# Patient Record
Sex: Male | Born: 1950
Health system: Southern US, Community
[De-identification: ages and names within clinical notes are randomized; demographics above are authoritative.]

## PROBLEM LIST (undated history)

## (undated) DIAGNOSIS — I10 Essential (primary) hypertension: Secondary | ICD-10-CM

## (undated) DIAGNOSIS — I509 Heart failure, unspecified: Secondary | ICD-10-CM

## (undated) DIAGNOSIS — I248 Other forms of acute ischemic heart disease: Secondary | ICD-10-CM

## (undated) DIAGNOSIS — I429 Cardiomyopathy, unspecified: Secondary | ICD-10-CM

## (undated) DIAGNOSIS — I2489 Other forms of acute ischemic heart disease: Secondary | ICD-10-CM

## (undated) DIAGNOSIS — N4889 Other specified disorders of penis: Secondary | ICD-10-CM

## (undated) DIAGNOSIS — E0590 Thyrotoxicosis, unspecified without thyrotoxic crisis or storm: Secondary | ICD-10-CM

## (undated) DIAGNOSIS — R079 Chest pain, unspecified: Secondary | ICD-10-CM

## (undated) DIAGNOSIS — N179 Acute kidney failure, unspecified: Secondary | ICD-10-CM

## (undated) DIAGNOSIS — N1330 Unspecified hydronephrosis: Secondary | ICD-10-CM

## (undated) DIAGNOSIS — R7989 Other specified abnormal findings of blood chemistry: Secondary | ICD-10-CM

## (undated) DIAGNOSIS — E119 Type 2 diabetes mellitus without complications: Secondary | ICD-10-CM

## (undated) HISTORY — PX: NOSE SURGERY: SHX723

## (undated) HISTORY — DX: Unspecified hydronephrosis: N13.30

## (undated) HISTORY — DX: Thyrotoxicosis, unspecified without thyrotoxic crisis or storm: E05.90

## (undated) HISTORY — DX: Other specified disorders of penis: N48.89

## (undated) HISTORY — DX: Other forms of acute ischemic heart disease: I24.8

## (undated) HISTORY — DX: Heart failure, unspecified: I50.9

## (undated) HISTORY — DX: Chest pain, unspecified: R07.9

## (undated) HISTORY — DX: Other forms of acute ischemic heart disease: I24.89

## (undated) HISTORY — DX: Acute kidney failure, unspecified: N17.9

## (undated) HISTORY — DX: Cardiomyopathy, unspecified: I42.9

## (undated) HISTORY — DX: Other specified abnormal findings of blood chemistry: R79.89

---

## 1997-07-07 ENCOUNTER — Ambulatory Visit (HOSPITAL_BASED_OUTPATIENT_CLINIC_OR_DEPARTMENT_OTHER): Admission: RE | Admit: 1997-07-07 | Discharge: 1997-07-07 | Payer: Self-pay | Admitting: *Deleted

## 1998-08-25 ENCOUNTER — Emergency Department (HOSPITAL_COMMUNITY): Admission: EM | Admit: 1998-08-25 | Discharge: 1998-08-26 | Payer: Self-pay | Admitting: Emergency Medicine

## 1998-08-29 ENCOUNTER — Encounter: Admission: RE | Admit: 1998-08-29 | Discharge: 1998-08-29 | Payer: Self-pay | Admitting: *Deleted

## 2006-08-04 ENCOUNTER — Ambulatory Visit: Payer: Self-pay | Admitting: Family Medicine

## 2006-08-05 ENCOUNTER — Ambulatory Visit: Payer: Self-pay | Admitting: Family Medicine

## 2017-12-17 ENCOUNTER — Encounter (HOSPITAL_COMMUNITY): Payer: Self-pay | Admitting: Emergency Medicine

## 2017-12-17 ENCOUNTER — Inpatient Hospital Stay (HOSPITAL_COMMUNITY)
Admission: EM | Admit: 2017-12-17 | Discharge: 2017-12-24 | DRG: 291 | Disposition: A | Discharge status: Home / Self Care | Payer: Medicare Other | Attending: Internal Medicine | Admitting: Internal Medicine

## 2017-12-17 ENCOUNTER — Emergency Department (HOSPITAL_COMMUNITY): Payer: Medicare Other

## 2017-12-17 ENCOUNTER — Other Ambulatory Visit: Payer: Self-pay

## 2017-12-17 DIAGNOSIS — E0590 Thyrotoxicosis, unspecified without thyrotoxic crisis or storm: Secondary | ICD-10-CM | POA: Diagnosis present

## 2017-12-17 DIAGNOSIS — D1724 Benign lipomatous neoplasm of skin and subcutaneous tissue of left leg: Secondary | ICD-10-CM | POA: Diagnosis present

## 2017-12-17 DIAGNOSIS — N179 Acute kidney failure, unspecified: Secondary | ICD-10-CM | POA: Diagnosis present

## 2017-12-17 DIAGNOSIS — Y92230 Patient room in hospital as the place of occurrence of the external cause: Secondary | ICD-10-CM | POA: Diagnosis present

## 2017-12-17 DIAGNOSIS — Z9119 Patient's noncompliance with other medical treatment and regimen: Secondary | ICD-10-CM

## 2017-12-17 DIAGNOSIS — I161 Hypertensive emergency: Secondary | ICD-10-CM | POA: Diagnosis present

## 2017-12-17 DIAGNOSIS — R061 Stridor: Secondary | ICD-10-CM

## 2017-12-17 DIAGNOSIS — I429 Cardiomyopathy, unspecified: Secondary | ICD-10-CM | POA: Diagnosis present

## 2017-12-17 DIAGNOSIS — R946 Abnormal results of thyroid function studies: Secondary | ICD-10-CM | POA: Diagnosis present

## 2017-12-17 DIAGNOSIS — I080 Rheumatic disorders of both mitral and aortic valves: Secondary | ICD-10-CM | POA: Diagnosis present

## 2017-12-17 DIAGNOSIS — I509 Heart failure, unspecified: Secondary | ICD-10-CM

## 2017-12-17 DIAGNOSIS — N1330 Unspecified hydronephrosis: Secondary | ICD-10-CM | POA: Diagnosis not present

## 2017-12-17 DIAGNOSIS — I130 Hypertensive heart and chronic kidney disease with heart failure and stage 1 through stage 4 chronic kidney disease, or unspecified chronic kidney disease: Secondary | ICD-10-CM | POA: Diagnosis not present

## 2017-12-17 DIAGNOSIS — E1122 Type 2 diabetes mellitus with diabetic chronic kidney disease: Secondary | ICD-10-CM | POA: Diagnosis not present

## 2017-12-17 DIAGNOSIS — R079 Chest pain, unspecified: Secondary | ICD-10-CM | POA: Diagnosis not present

## 2017-12-17 DIAGNOSIS — N183 Chronic kidney disease, stage 3 (moderate): Secondary | ICD-10-CM | POA: Diagnosis present

## 2017-12-17 DIAGNOSIS — N5082 Scrotal pain: Secondary | ICD-10-CM

## 2017-12-17 DIAGNOSIS — I5043 Acute on chronic combined systolic (congestive) and diastolic (congestive) heart failure: Secondary | ICD-10-CM | POA: Diagnosis present

## 2017-12-17 DIAGNOSIS — T508X5A Adverse effect of diagnostic agents, initial encounter: Secondary | ICD-10-CM | POA: Diagnosis present

## 2017-12-17 DIAGNOSIS — I4581 Long QT syndrome: Secondary | ICD-10-CM | POA: Diagnosis present

## 2017-12-17 DIAGNOSIS — R0602 Shortness of breath: Secondary | ICD-10-CM | POA: Diagnosis not present

## 2017-12-17 DIAGNOSIS — I110 Hypertensive heart disease with heart failure: Secondary | ICD-10-CM | POA: Diagnosis not present

## 2017-12-17 DIAGNOSIS — R7989 Other specified abnormal findings of blood chemistry: Secondary | ICD-10-CM

## 2017-12-17 DIAGNOSIS — N1339 Other hydronephrosis: Secondary | ICD-10-CM | POA: Diagnosis not present

## 2017-12-17 DIAGNOSIS — I160 Hypertensive urgency: Secondary | ICD-10-CM | POA: Diagnosis not present

## 2017-12-17 DIAGNOSIS — R945 Abnormal results of liver function studies: Secondary | ICD-10-CM | POA: Diagnosis present

## 2017-12-17 DIAGNOSIS — R3129 Other microscopic hematuria: Secondary | ICD-10-CM | POA: Diagnosis present

## 2017-12-17 DIAGNOSIS — R609 Edema, unspecified: Secondary | ICD-10-CM

## 2017-12-17 DIAGNOSIS — N4889 Other specified disorders of penis: Secondary | ICD-10-CM | POA: Diagnosis present

## 2017-12-17 DIAGNOSIS — R778 Other specified abnormalities of plasma proteins: Secondary | ICD-10-CM | POA: Diagnosis present

## 2017-12-17 DIAGNOSIS — I248 Other forms of acute ischemic heart disease: Secondary | ICD-10-CM

## 2017-12-17 DIAGNOSIS — R0603 Acute respiratory distress: Secondary | ICD-10-CM

## 2017-12-17 HISTORY — DX: Type 2 diabetes mellitus without complications: E11.9

## 2017-12-17 HISTORY — DX: Heart failure, unspecified: I50.9

## 2017-12-17 HISTORY — DX: Acute kidney failure, unspecified: N17.9

## 2017-12-17 HISTORY — DX: Essential (primary) hypertension: I10

## 2017-12-17 HISTORY — DX: Chest pain, unspecified: R07.9

## 2017-12-17 LAB — POCT I-STAT TROPONIN I: TROPONIN I, POC: 0.08 ng/mL (ref 0.00–0.08)

## 2017-12-17 LAB — URINALYSIS, ROUTINE W REFLEX MICROSCOPIC
BILIRUBIN URINE: NEGATIVE
Bacteria, UA: NONE SEEN
GLUCOSE, UA: NEGATIVE mg/dL
KETONES UR: 5 mg/dL — AB
Leukocytes, UA: NEGATIVE
NITRITE: NEGATIVE
PH: 8 (ref 5.0–8.0)
Protein, ur: NEGATIVE mg/dL
Specific Gravity, Urine: 1.015 (ref 1.005–1.030)

## 2017-12-17 LAB — CBC
HEMATOCRIT: 42.8 % (ref 39.0–52.0)
Hemoglobin: 14 g/dL (ref 13.0–17.0)
MCH: 27.3 pg (ref 26.0–34.0)
MCHC: 32.7 g/dL (ref 30.0–36.0)
MCV: 83.6 fL (ref 78.0–100.0)
Platelets: 215 10*3/uL (ref 150–400)
RBC: 5.12 MIL/uL (ref 4.22–5.81)
RDW: 14 % (ref 11.5–15.5)
WBC: 7.9 10*3/uL (ref 4.0–10.5)

## 2017-12-17 LAB — BASIC METABOLIC PANEL
Anion gap: 7 (ref 5–15)
BUN: 24 mg/dL — AB (ref 8–23)
CO2: 29 mmol/L (ref 22–32)
Calcium: 8.7 mg/dL — ABNORMAL LOW (ref 8.9–10.3)
Chloride: 105 mmol/L (ref 98–111)
Creatinine, Ser: 1.71 mg/dL — ABNORMAL HIGH (ref 0.61–1.24)
GFR calc Af Amer: 47 mL/min — ABNORMAL LOW (ref >60)
GFR calc non Af Amer: 40 mL/min — ABNORMAL LOW (ref >60)
Glucose, Bld: 150 mg/dL — ABNORMAL HIGH (ref 70–99)
Potassium: 3.7 mmol/L (ref 3.5–5.1)
SODIUM: 141 mmol/L (ref 135–145)

## 2017-12-17 LAB — I-STAT TROPONIN, ED: Troponin i, poc: 0.07 ng/mL (ref 0.00–0.08)

## 2017-12-17 LAB — BRAIN NATRIURETIC PEPTIDE: B NATRIURETIC PEPTIDE 5: 1450.7 pg/mL — AB (ref 0.0–100.0)

## 2017-12-17 MED ORDER — ACETAMINOPHEN 650 MG RE SUPP: 650.0000 mg | Freq: Four times a day (QID) | RECTAL | Status: DC | PRN

## 2017-12-17 MED ORDER — LABETALOL HCL 100 MG PO TABS
100.0000 mg | ORAL_TABLET | Freq: Two times a day (BID) | ORAL | Status: DC
  Administered 2017-12-17 – 2017-12-18 (×2): 100 mg via ORAL
  Filled 2017-12-17 (×2): qty 1

## 2017-12-17 MED ORDER — ONDANSETRON HCL 4 MG/2ML IJ SOLN: 4.0000 mg | Freq: Four times a day (QID) | INTRAMUSCULAR | Status: DC | PRN

## 2017-12-17 MED ORDER — NICARDIPINE HCL IN NACL 20-0.86 MG/200ML-% IV SOLN
3.0000 mg/h | Freq: Once | INTRAVENOUS | Status: AC
  Administered 2017-12-17: 5 mg/h via INTRAVENOUS
  Filled 2017-12-17: qty 200

## 2017-12-17 MED ORDER — ACETAMINOPHEN 325 MG PO TABS
650.0000 mg | ORAL_TABLET | Freq: Four times a day (QID) | ORAL | Status: DC | PRN
  Administered 2017-12-21 – 2017-12-23 (×3): 650 mg via ORAL
  Filled 2017-12-17 (×3): qty 2

## 2017-12-17 MED ORDER — LABETALOL HCL 5 MG/ML IV SOLN: 10.0000 mg | INTRAVENOUS | Status: DC | PRN

## 2017-12-17 MED ORDER — LABETALOL HCL 5 MG/ML IV SOLN: 5.0000 mg | INTRAVENOUS | Status: DC | PRN

## 2017-12-17 MED ORDER — ONDANSETRON HCL 4 MG PO TABS: 4.0000 mg | ORAL_TABLET | Freq: Four times a day (QID) | ORAL | Status: DC | PRN

## 2017-12-17 MED ORDER — IOPAMIDOL (ISOVUE-370) INJECTION 76%
80.0000 mL | Freq: Once | INTRAVENOUS | Status: AC | PRN
  Administered 2017-12-17: 80 mL via INTRAVENOUS

## 2017-12-17 NOTE — H&P (Signed)
History and Physical    Daniel Obrien ZDG:[id number] DOB: Jan 05, 1951 DOA: 12/17/2017  PCP: Patient, No Pcp Per  Patient coming from: Home.  Chief Complaint: Chest pain.  History obtained through interpretation provided by patient's son.  Patient speaks [place].  HPI: Daniel Obrien is a 67 y.o. male with history of hypertension has not been taking her medications for last many years presents to the ER with complaint of chest pain which has been ongoing for last 24 hours.  Patient tried over-the-counter ibuprofen despite which pain did not improve.  Patient's chest pain is across the anterior chest wall stabbing in nature nonradiating no associated shortness of breath productive cough fever or chills.  Denies any headache visual symptoms or any focal deficits.  ED Course: In the ER patient blood pressure is found to be more than 951 systolic and was started on Cardene infusion initially.  Soon blood pressure improved to 884 systolic and by the time I examined patient blood pressure has decreased to 127 and Cardene was discontinued.  EKG shows normal sinus rhythm troponin was negative BNP was thousand 400.  Since patient had significantly elevated blood pressure with chest pain CT angiogram of the chest abdomen and pelvis was done to rule out dissection.  It was negative for dissection.  He did show cardiomegaly and congestion.  Labs revealed creatinine 1.7.  Review of Systems: As per HPI, rest all negative.   Past Medical History:  Diagnosis Date  . Hypertension     History reviewed. No pertinent surgical history.   reports that he has never smoked. He has never used smokeless tobacco. He reports that he does not drink alcohol. His drug history is not on file.  No Known Allergies  Family History  Problem Relation Age of Onset  . Hypertension Neg Hx     Prior to Admission medications   Medication Sig Start Date End Date Taking? Authorizing Provider  ibuprofen (ADVIL,MOTRIN) 200 MG  tablet Take 200 mg by mouth every 6 (six) hours as needed for moderate pain.   Yes [provider]    Physical Exam: Vitals:   12/17/17 2100 12/17/17 2130 12/17/17 2145 12/17/17 2200  BP: (!) 162/93 127/85 (!) 166/95 (!) 160/98  Pulse: 91 90 94 89  Resp:      Temp:      TempSrc:      SpO2: 95% 97% 95% 96%      Constitutional: Moderately built and nourished. Vitals:   12/17/17 2100 12/17/17 2130 12/17/17 2145 12/17/17 2200  BP: (!) 162/93 127/85 (!) 166/95 (!) 160/98  Pulse: 91 90 94 89  Resp:      Temp:      TempSrc:      SpO2: 95% 97% 95% 96%   Eyes: Anicteric no pallor. ENMT: No discharge from the ears eyes nose or mouth. Neck: No mass or.  No neck rigidity.  No JVD appreciated. Respiratory: No rhonchi or crepitations. Cardiovascular: S1-S2 heard no murmurs appreciated. Abdomen: Soft nontender bowel sounds present. Musculoskeletal: No edema. Skin: No rash. Neurologic: Alert awake oriented to time place and person.  Moves all extremities. Psychiatric: Appears normal.  Normal affect.   Labs on Admission: I have personally reviewed following labs and imaging studies  CBC: Recent Labs  Lab 12/17/17 1610  WBC 7.9  HGB 14.0  HCT 42.8  MCV 83.6  PLT 166   Basic Metabolic Panel: Recent Labs  Lab 12/17/17 1610  NA 141  K 3.7  CL 105  CO2 29  GLUCOSE 150*  BUN 24*  CREATININE 1.71*  CALCIUM 8.7*   GFR: CrCl cannot be calculated (Unknown ideal weight.). Liver Function Tests: No results for input(s): AST, ALT, ALKPHOS, BILITOT, PROT, ALBUMIN in the last 168 hours. No results for input(s): LIPASE, AMYLASE in the last 168 hours. No results for input(s): AMMONIA in the last 168 hours. Coagulation Profile: No results for input(s): INR, PROTIME in the last 168 hours. Cardiac Enzymes: No results for input(s): CKTOTAL, CKMB, CKMBINDEX, TROPONINI in the last 168 hours. BNP (last 3 results) No results for input(s): PROBNP in the last 8760  hours. HbA1C: No results for input(s): HGBA1C in the last 72 hours. CBG: No results for input(s): GLUCAP in the last 168 hours. Lipid Profile: No results for input(s): CHOL, HDL, LDLCALC, TRIG, CHOLHDL, LDLDIRECT in the last 72 hours. Thyroid Function Tests: No results for input(s): TSH, T4TOTAL, FREET4, T3FREE, THYROIDAB in the last 72 hours. Anemia Panel: No results for input(s): VITAMINB12, FOLATE, FERRITIN, TIBC, IRON, RETICCTPCT in the last 72 hours. Urine analysis: No results found for: COLORURINE, APPEARANCEUR, LABSPEC, PHURINE, GLUCOSEU, HGBUR, BILIRUBINUR, KETONESUR, PROTEINUR, UROBILINOGEN, NITRITE, LEUKOCYTESUR Sepsis Labs: @LABRCNTIP (procalcitonin:4,lacticidven:4) )No results found for this or any previous visit (from the past 240 hour(s)).   Radiological Exams on Admission: Dg Chest 2 View  Result Date: 12/17/2017 CLINICAL DATA:  One day history of chest pain, shortness of breath and generalized body aches. EXAM: CHEST - 2 VIEW COMPARISON:  None. FINDINGS: AP SEMI-ERECT and LATERAL images were obtained. Cardiac silhouette markedly enlarged even allowing for AP technique. Thoracic aorta tortuous and mildly atherosclerotic. Hilar and mediastinal contours otherwise unremarkable. Mild diffuse interstitial pulmonary edema as there are scattered Kerley B lines scattered throughout both lungs. No confluent airspace consolidation. No significant pleural effusions. Visualized bony thorax intact. IMPRESSION: Mild CHF, with marked cardiomegaly and mild diffuse interstitial pulmonary edema. Electronically Signed   By: Evangeline Dakin M.D.   On: 12/17/2017 17:23   Ct Angio Chest/abd/pel For Dissection W And/or W/wo  Result Date: 12/17/2017 CLINICAL DATA:  Hypertension, shortness of breath, chest pain EXAM: CT ANGIOGRAPHY CHEST, ABDOMEN AND PELVIS TECHNIQUE: Multidetector CT imaging through the chest, abdomen and pelvis was performed using the standard protocol during bolus administration of  intravenous contrast. Multiplanar reconstructed images and MIPs were obtained and reviewed to evaluate the vascular anatomy. CONTRAST:  12mL ISOVUE-370 IOPAMIDOL (ISOVUE-370) INJECTION 76% COMPARISON:  12/17/2017 FINDINGS: CTA CHEST FINDINGS Cardiovascular: Atherosclerosis of the thoracic aorta. Negative for significant dissection, aneurysm, or hyperdense intramural hematoma. Patent tortuous three-vessel arch anatomy. No mediastinal hemorrhage or hematoma. Central pulmonary arteries appear patent. Is mildly enlarged. No pericardial effusion. Mediastinum/Nodes: Thyroid mildly heterogeneous. Trachea and central airways are patent. Esophagus is nondilated. No significant hernia. No adenopathy. Lungs/Pleura: Upper lobe interlobular septal thickening. Diffuse respiratory motion artifact. Mild scattered ground-glass opacity throughout both lungs. Low lung volumes with basilar vascular crowding. Appearance suggest early interstitial edema. No definite focal acute airspace process, collapse or consolidation. No interstitial lung disease. No pleural abnormality, effusion, or pneumothorax. Musculoskeletal: No acute osseous finding. No compression fracture. Sternum intact. Review of the MIP images confirms the above findings. CTA ABDOMEN AND PELVIS FINDINGS VASCULAR Aorta: Abdominal atherosclerosis and mild tortuosity without aneurysm, dissection, occlusive process or acute vascular finding. No retroperitoneal hemorrhage or hematoma. Celiac: Widely patent including its branches SMA: Widely patent including its branches Renals: Both main renal arteries are patent. IMA: Remains patent off the distal aorta including its branches Inflow: Atherosclerosis and tortuosity of the iliac  vessels. Iliac vessels remain patent without inflow disease. The common, internal and external iliac arteries are all patent. Veins: Venous phase imaging not performed. Review of the MIP images confirms the above findings. NON-VASCULAR Hepatobiliary: No  focal liver abnormality is seen. No gallstones, gallbladder wall thickening, or biliary dilatation. Pancreas: Unremarkable. No pancreatic ductal dilatation or surrounding inflammatory changes. Spleen: Normal in size without focal abnormality. Adrenals/Urinary Tract: Normal adrenal glands. Right kidney and ureter demonstrate no acute process, obstructive uropathy, hydroureter, or ureteral calculus. Bladder unremarkable. Left kidney demonstrates chronic appearing mild to moderate hydronephrosis and proximal hydroureter without obstructing radiopaque left ureteral calculus. Stomach/Bowel: Negative for bowel obstruction, significant dilatation, ileus, or free air. Appendix not visualized. No acute inflammatory process, fluid collection, or abscess. Lymphatic: No adenopathy Reproductive: Unremarkable Other: No abdominal wall hernia or abnormality. No abdominopelvic ascites. Musculoskeletal: Degenerative changes of the spine. No acute osseous finding. Review of the MIP images confirms the above findings. IMPRESSION: Thoraco abdominal aortic atherosclerosis without acute dissection, significant aneurysm, occlusive disease or other acute vascular process. Cardiomegaly with mild pulmonary interstitial edema pattern suggesting early CHF or volume overload. Mild-to-moderate left hydroureteronephrosis without obstructing left ureteral calculus appearing more chronic than acute without surrounding perinephric edema. Consider nonemergent urology follow-up No other acute intra-abdominal or pelvic finding. Electronically Signed   By: Jerilynn Mages.  Shick M.D.   On: 12/17/2017 19:30    EKG: Independently reviewed.  Normal sinus rhythm.  Assessment/Plan Principal Problem:   Hypertensive urgency Active Problems:   Acute CHF (congestive heart failure) (HCC)   Chest pain   ARF (acute renal failure) ([place])    1. Hypertensive urgency -patient has history of hypertension has not been taking his medications not sure if he was prescribed  one before.  Initial blood pressure was markedly elevated and was placed on Cardene drip and has been weaned off now.  I have ordered labetalol 100 mg p.o. twice daily with PRN IV labetalol.  Closely follow blood pressure trends. 2. Acute CHF likely precipitated by hypertensive urgency.  Will check 2D echo.  Patient does have some congestion in the CAT scan but on exam there is no obvious fluid overload.  We will closely observe. 3. Chest pain -improved with control of blood pressure.  Presently chest pain-free.  We will cycle cardiac markers check 2D echo aspirin. 4. Renal failure no old labs to compare.  Likely could be acute from use of NSAIDs. 5. Hematuria and left hydronephrosis will need outpatient follow-up with urologist.  Abdomen appears benign on exam.   DVT prophylaxis: SCDs for now we will keep patient on Lovenox once blood pressure improves. Code Status: Full code. Family Communication: Patient's son. Disposition Plan: Home. Consults called: None.   Admission status: Observation.   Rise Patience MD [facility] Pager [phone].  If 7PM-7AM, please contact night-coverage [email or web address] Password [facility]  12/17/2017, 10:32 PM

## 2017-12-17 NOTE — ED Provider Notes (Signed)
[place] DEPT Provider Note   CSN: [id number] Arrival date & time: 12/17/17  1537     History   Chief Complaint Chief Complaint  Patient presents with  . Chest Pain  . Generalized Body Aches    HPI Daniel Obrien is a 67 y.o. male with a past medical history of hypertension who presents today for evaluation of chest pains and body pain since last night.  History is obtained by son, and video interpreter as patient is primarily [place] speaking.  Patient indicates that he wishes for his son to translate.  Patient reports chest pain and shortness of breath since last night.  Believes he has a history of hypertension, however does not take any medicine.  Patient's son reports that he does not go to the doctor or hospital regularly.  Patient's pain is left anterior chest, denies abdominal pain, back pain, nausea vomiting or diarrhea.  HPI  Past Medical History:  Diagnosis Date  . Hypertension     There are no active problems to display for this patient.   History reviewed. No pertinent surgical history.      Home Medications    Prior to Admission medications   Medication Sig Start Date End Date Taking? Authorizing Provider  ibuprofen (ADVIL,MOTRIN) 200 MG tablet Take 200 mg by mouth every 6 (six) hours as needed for moderate pain.   Yes [provider]    Family History No family history on file.  Social History Social History   Tobacco Use  . Smoking status: Never Smoker  . Smokeless tobacco: Never Used  Substance Use Topics  . Alcohol use: Not on file  . Drug use: Not on file     Allergies   Patient has no known allergies.   Review of Systems Review of Systems  Constitutional: Negative for chills and fever.  Respiratory: Positive for chest tightness and shortness of breath. Negative for cough.   Cardiovascular: Positive for chest pain. Negative for palpitations and leg swelling.  Gastrointestinal: Negative for  abdominal pain, diarrhea, nausea and vomiting.  Genitourinary: Positive for frequency and urgency. Negative for decreased urine volume.  All other systems reviewed and are negative.    Physical Exam Updated Vital Signs BP (!) 221/130   Pulse 86   Resp 16   SpO2 96%   Physical Exam  Constitutional: He appears well-developed and well-nourished.  Non-toxic appearance. No distress.  HENT:  Head: Normocephalic and atraumatic.  Eyes: Conjunctivae are normal. Right eye exhibits no discharge. Left eye exhibits no discharge. No scleral icterus.  Neck: Normal range of motion.  Cardiovascular: Normal rate, regular rhythm, intact distal pulses and normal pulses.  No murmur heard. Pulses:      Radial pulses are 2+ on the right side, and 2+ on the left side.       Dorsalis pedis pulses are 2+ on the right side, and 2+ on the left side.       Posterior tibial pulses are 2+ on the right side, and 2+ on the left side.  Pulmonary/Chest: Effort normal. Tachypnea noted. No respiratory distress. He has rales in the right middle field, the right lower field, the left middle field and the left lower field.  Abdominal: He exhibits no distension and no mass. There is no tenderness. There is no guarding.  Musculoskeletal: He exhibits no edema or deformity.       Right lower leg: Normal. He exhibits no tenderness and no edema.  Left lower leg: Normal. He exhibits no tenderness and no edema.  Neurological: He is alert. He exhibits normal muscle tone.  Skin: Skin is warm and dry. He is not diaphoretic.  Psychiatric: He has a normal mood and affect. His behavior is normal.  Nursing note and vitals reviewed.    ED Treatments / Results  Labs (all labs ordered are listed, but only abnormal results are displayed) Labs Reviewed  BASIC METABOLIC PANEL - Abnormal; Notable for the following components:      Result Value   Glucose, Bld 150 (*)    BUN 24 (*)    Creatinine, Ser 1.71 (*)    Calcium 8.7 (*)     GFR calc non Af Amer 40 (*)    GFR calc Af Amer 47 (*)    All other components within normal limits  BRAIN NATRIURETIC PEPTIDE - Abnormal; Notable for the following components:   B Natriuretic Peptide 1,450.7 (*)    All other components within normal limits  URINE CULTURE  CBC  URINALYSIS, ROUTINE W REFLEX MICROSCOPIC  I-STAT TROPONIN, ED  I-STAT TROPONIN, ED  POCT I-STAT TROPONIN I    EKG EKG Interpretation  Date/Time:  Wednesday December 17 2017 15:48:32 EDT Ventricular Rate:  87 PR Interval:    QRS Duration: 115 QT Interval:  395 QTC Calculation: 476 R Axis:   -32 Text Interpretation:  Sinus rhythm LVH with secondary repolarization abnormality Borderline prolonged QT interval no prior to compare  Confirmed by Aletta Edouard [phone]) on 12/17/2017 5:12:33 PM   Radiology Dg Chest 2 View  Result Date: 12/17/2017 CLINICAL DATA:  One day history of chest pain, shortness of breath and generalized body aches. EXAM: CHEST - 2 VIEW COMPARISON:  None. FINDINGS: AP SEMI-ERECT and LATERAL images were obtained. Cardiac silhouette markedly enlarged even allowing for AP technique. Thoracic aorta tortuous and mildly atherosclerotic. Hilar and mediastinal contours otherwise unremarkable. Mild diffuse interstitial pulmonary edema as there are scattered Kerley B lines scattered throughout both lungs. No confluent airspace consolidation. No significant pleural effusions. Visualized bony thorax intact. IMPRESSION: Mild CHF, with marked cardiomegaly and mild diffuse interstitial pulmonary edema. Electronically Signed   By: Evangeline Dakin M.D.   On: 12/17/2017 17:23   Ct Angio Chest/abd/pel For Dissection W And/or W/wo  Result Date: 12/17/2017 CLINICAL DATA:  Hypertension, shortness of breath, chest pain EXAM: CT ANGIOGRAPHY CHEST, ABDOMEN AND PELVIS TECHNIQUE: Multidetector CT imaging through the chest, abdomen and pelvis was performed using the standard protocol during bolus administration of  intravenous contrast. Multiplanar reconstructed images and MIPs were obtained and reviewed to evaluate the vascular anatomy. CONTRAST:  28mL ISOVUE-370 IOPAMIDOL (ISOVUE-370) INJECTION 76% COMPARISON:  12/17/2017 FINDINGS: CTA CHEST FINDINGS Cardiovascular: Atherosclerosis of the thoracic aorta. Negative for significant dissection, aneurysm, or hyperdense intramural hematoma. Patent tortuous three-vessel arch anatomy. No mediastinal hemorrhage or hematoma. Central pulmonary arteries appear patent. Is mildly enlarged. No pericardial effusion. Mediastinum/Nodes: Thyroid mildly heterogeneous. Trachea and central airways are patent. Esophagus is nondilated. No significant hernia. No adenopathy. Lungs/Pleura: Upper lobe interlobular septal thickening. Diffuse respiratory motion artifact. Mild scattered ground-glass opacity throughout both lungs. Low lung volumes with basilar vascular crowding. Appearance suggest early interstitial edema. No definite focal acute airspace process, collapse or consolidation. No interstitial lung disease. No pleural abnormality, effusion, or pneumothorax. Musculoskeletal: No acute osseous finding. No compression fracture. Sternum intact. Review of the MIP images confirms the above findings. CTA ABDOMEN AND PELVIS FINDINGS VASCULAR Aorta: Abdominal atherosclerosis and mild tortuosity without aneurysm, dissection, occlusive  process or acute vascular finding. No retroperitoneal hemorrhage or hematoma. Celiac: Widely patent including its branches SMA: Widely patent including its branches Renals: Both main renal arteries are patent. IMA: Remains patent off the distal aorta including its branches Inflow: Atherosclerosis and tortuosity of the iliac vessels. Iliac vessels remain patent without inflow disease. The common, internal and external iliac arteries are all patent. Veins: Venous phase imaging not performed. Review of the MIP images confirms the above findings. NON-VASCULAR Hepatobiliary: No  focal liver abnormality is seen. No gallstones, gallbladder wall thickening, or biliary dilatation. Pancreas: Unremarkable. No pancreatic ductal dilatation or surrounding inflammatory changes. Spleen: Normal in size without focal abnormality. Adrenals/Urinary Tract: Normal adrenal glands. Right kidney and ureter demonstrate no acute process, obstructive uropathy, hydroureter, or ureteral calculus. Bladder unremarkable. Left kidney demonstrates chronic appearing mild to moderate hydronephrosis and proximal hydroureter without obstructing radiopaque left ureteral calculus. Stomach/Bowel: Negative for bowel obstruction, significant dilatation, ileus, or free air. Appendix not visualized. No acute inflammatory process, fluid collection, or abscess. Lymphatic: No adenopathy Reproductive: Unremarkable Other: No abdominal wall hernia or abnormality. No abdominopelvic ascites. Musculoskeletal: Degenerative changes of the spine. No acute osseous finding. Review of the MIP images confirms the above findings. IMPRESSION: Thoraco abdominal aortic atherosclerosis without acute dissection, significant aneurysm, occlusive disease or other acute vascular process. Cardiomegaly with mild pulmonary interstitial edema pattern suggesting early CHF or volume overload. Mild-to-moderate left hydroureteronephrosis without obstructing left ureteral calculus appearing more chronic than acute without surrounding perinephric edema. Consider nonemergent urology follow-up No other acute intra-abdominal or pelvic finding. Electronically Signed   By: Jerilynn Mages.  Shick M.D.   On: 12/17/2017 19:30    Procedures Procedures (including critical care time)  CRITICAL CARE Performed by: Wyn Quaker Total critical care time: 40 minutes Critical care time was exclusive of separately billable procedures and treating other patients. Critical care was necessary to treat or prevent imminent or life-threatening deterioration. Critical care was time spent  personally by me on the following activities: development of treatment plan with patient and/or surrogate as well as nursing, discussions with consultants, evaluation of patient's response to treatment, examination of patient, obtaining history from patient or surrogate, ordering and performing treatments and interventions, ordering and review of laboratory studies, ordering and review of radiographic studies, pulse oximetry and re-evaluation of patient's condition. Hypertensive emergency with acute CHF, requiring cardene drip.     Medications Ordered in ED Medications  nicardipine (CARDENE) 20mg  in 0.86% saline 23ml IV infusion (0.1 mg/ml) (0 mg/hr Intravenous Stopped 12/17/17 2129)  iopamidol (ISOVUE-370) 76 % injection 80 mL (80 mLs Intravenous Contrast Given 12/17/17 1834)     Initial Impression / Assessment and Plan / ED Course  I have reviewed the triage vital signs and the nursing notes.  Pertinent labs & imaging results that were available during my care of the patient were reviewed by me and considered in my medical decision making (see chart for details).  Clinical Course as of Dec 17 1745  Wed Dec 17, 2017  1743 Asked CT scan to scan patient next.  Ordered Cardene drip.  Told Dr. Melina Copa about patient status.    [EH]  [id number] 66 year old male [place] speaking history probably from son.  He is here with chest pain and body pain.  He is very hypertensive here.  Starting him on IV drip for his hypertension and getting lab work and CT.   [MB]    Clinical Course User Index [EH] Lorin Glass, PA-C [MB] Hayden Rasmussen, MD  Kymere Fullington presents today for evaluation of left-sided chest pain.  He reportedly has a history of hypertension, however is noncompliant with his medications and has not been to a doctor in many years according to his son.  He was noted to be markedly hypertensive with blood pressure of 221/130 and mildly tachypneic.  Chest x-ray showed concern for mild  edema with curly B-lines and marked cardiomegaly.  It was noted on chest x-ray that his thoracic aorta is tortuous.  Given his acute shortness of breath and pulmonary edema with chest pain concern for hypertensive emergency.  Patient does not have previous labs on file to compare.  Given chest pain with widened aorta in the setting of many years of untreated hypertension will obtain CT dissection study.  His GFR is mildly low, however, considering that an untreated thoracic dissection would most likely be fatal will pursue CT imaging.  Patient was started on Cardene drip.    CT Disection study showed concern for pulmonary edema.  His BNP is elevated at 1450.  Troponin x2 is on the upper limit of normal at 0.07 and 0.08.  Sugar is mildly elevated at 150.  Do not have baseline labs in the system to compare to.  Given that patient got contrast for CT dissection study with either AKI or CKD, concern for lasix causing increased kidney stress.  His breathing and chest pain got better after cardene drip.    Hospitalist Dr. Hal Hope who agreed to admit patient.    This patient was seen as a shared visit with Dr. Melina Copa.     Final Clinical Impressions(s) / ED Diagnoses   Final diagnoses:  Hypertensive emergency  Congestive heart failure, unspecified HF chronicity, unspecified heart failure type [facility])    ED Discharge Orders    None       Ollen Gross 12/17/17 2232    Hayden Rasmussen, MD 12/18/17 [phone]

## 2017-12-17 NOTE — ED Triage Notes (Signed)
Pt's son states that pt c/o chest pain and body pains since yesterday. Denies n/v/d or cough. Believes pt has HTN.

## 2017-12-17 NOTE — ED Notes (Signed)
Cardene drip stopped currently. Hospitalist aware and will put new orders in for patient. Hospitalist aware BP of 127/85.

## 2017-12-18 ENCOUNTER — Inpatient Hospital Stay (HOSPITAL_COMMUNITY): Payer: Medicare Other

## 2017-12-18 ENCOUNTER — Encounter (HOSPITAL_COMMUNITY): Payer: Self-pay | Admitting: Physician Assistant

## 2017-12-18 ENCOUNTER — Observation Stay (HOSPITAL_COMMUNITY): Payer: Medicare Other

## 2017-12-18 DIAGNOSIS — Y92230 Patient room in hospital as the place of occurrence of the external cause: Secondary | ICD-10-CM | POA: Diagnosis not present

## 2017-12-18 DIAGNOSIS — R311 Benign essential microscopic hematuria: Secondary | ICD-10-CM | POA: Diagnosis not present

## 2017-12-18 DIAGNOSIS — E0590 Thyrotoxicosis, unspecified without thyrotoxic crisis or storm: Secondary | ICD-10-CM | POA: Diagnosis not present

## 2017-12-18 DIAGNOSIS — I4581 Long QT syndrome: Secondary | ICD-10-CM | POA: Diagnosis present

## 2017-12-18 DIAGNOSIS — I5041 Acute combined systolic (congestive) and diastolic (congestive) heart failure: Secondary | ICD-10-CM | POA: Diagnosis not present

## 2017-12-18 DIAGNOSIS — N179 Acute kidney failure, unspecified: Secondary | ICD-10-CM | POA: Diagnosis present

## 2017-12-18 DIAGNOSIS — I429 Cardiomyopathy, unspecified: Secondary | ICD-10-CM

## 2017-12-18 DIAGNOSIS — I080 Rheumatic disorders of both mitral and aortic valves: Secondary | ICD-10-CM | POA: Diagnosis present

## 2017-12-18 DIAGNOSIS — I420 Dilated cardiomyopathy: Secondary | ICD-10-CM

## 2017-12-18 DIAGNOSIS — R7989 Other specified abnormal findings of blood chemistry: Secondary | ICD-10-CM | POA: Diagnosis present

## 2017-12-18 DIAGNOSIS — R778 Other specified abnormalities of plasma proteins: Secondary | ICD-10-CM | POA: Diagnosis present

## 2017-12-18 DIAGNOSIS — D1724 Benign lipomatous neoplasm of skin and subcutaneous tissue of left leg: Secondary | ICD-10-CM | POA: Diagnosis present

## 2017-12-18 DIAGNOSIS — I5043 Acute on chronic combined systolic (congestive) and diastolic (congestive) heart failure: Secondary | ICD-10-CM | POA: Diagnosis present

## 2017-12-18 DIAGNOSIS — N189 Chronic kidney disease, unspecified: Secondary | ICD-10-CM | POA: Diagnosis not present

## 2017-12-18 DIAGNOSIS — N170 Acute kidney failure with tubular necrosis: Secondary | ICD-10-CM | POA: Diagnosis not present

## 2017-12-18 DIAGNOSIS — R0603 Acute respiratory distress: Secondary | ICD-10-CM | POA: Diagnosis not present

## 2017-12-18 DIAGNOSIS — N4889 Other specified disorders of penis: Secondary | ICD-10-CM | POA: Diagnosis not present

## 2017-12-18 DIAGNOSIS — E1122 Type 2 diabetes mellitus with diabetic chronic kidney disease: Secondary | ICD-10-CM | POA: Diagnosis present

## 2017-12-18 DIAGNOSIS — I161 Hypertensive emergency: Secondary | ICD-10-CM

## 2017-12-18 DIAGNOSIS — T508X5A Adverse effect of diagnostic agents, initial encounter: Secondary | ICD-10-CM | POA: Diagnosis present

## 2017-12-18 DIAGNOSIS — N184 Chronic kidney disease, stage 4 (severe): Secondary | ICD-10-CM | POA: Diagnosis not present

## 2017-12-18 DIAGNOSIS — I5021 Acute systolic (congestive) heart failure: Secondary | ICD-10-CM | POA: Diagnosis not present

## 2017-12-18 DIAGNOSIS — R079 Chest pain, unspecified: Secondary | ICD-10-CM | POA: Diagnosis not present

## 2017-12-18 DIAGNOSIS — I351 Nonrheumatic aortic (valve) insufficiency: Secondary | ICD-10-CM

## 2017-12-18 DIAGNOSIS — I160 Hypertensive urgency: Secondary | ICD-10-CM | POA: Diagnosis present

## 2017-12-18 DIAGNOSIS — N1330 Unspecified hydronephrosis: Secondary | ICD-10-CM | POA: Diagnosis present

## 2017-12-18 DIAGNOSIS — Z9119 Patient's noncompliance with other medical treatment and regimen: Secondary | ICD-10-CM | POA: Diagnosis not present

## 2017-12-18 DIAGNOSIS — N132 Hydronephrosis with renal and ureteral calculous obstruction: Secondary | ICD-10-CM | POA: Diagnosis not present

## 2017-12-18 DIAGNOSIS — I248 Other forms of acute ischemic heart disease: Secondary | ICD-10-CM

## 2017-12-18 DIAGNOSIS — R946 Abnormal results of thyroid function studies: Secondary | ICD-10-CM | POA: Diagnosis present

## 2017-12-18 DIAGNOSIS — N183 Chronic kidney disease, stage 3 (moderate): Secondary | ICD-10-CM | POA: Diagnosis present

## 2017-12-18 DIAGNOSIS — R945 Abnormal results of liver function studies: Secondary | ICD-10-CM | POA: Diagnosis present

## 2017-12-18 DIAGNOSIS — I130 Hypertensive heart and chronic kidney disease with heart failure and stage 1 through stage 4 chronic kidney disease, or unspecified chronic kidney disease: Secondary | ICD-10-CM | POA: Diagnosis present

## 2017-12-18 DIAGNOSIS — R2242 Localized swelling, mass and lump, left lower limb: Secondary | ICD-10-CM | POA: Diagnosis not present

## 2017-12-18 DIAGNOSIS — R3129 Other microscopic hematuria: Secondary | ICD-10-CM | POA: Diagnosis present

## 2017-12-18 HISTORY — DX: Thyrotoxicosis, unspecified without thyrotoxic crisis or storm: E05.90

## 2017-12-18 HISTORY — DX: Unspecified hydronephrosis: N13.30

## 2017-12-18 HISTORY — DX: Cardiomyopathy, unspecified: I42.9

## 2017-12-18 HISTORY — DX: Other specified abnormalities of plasma proteins: R77.8

## 2017-12-18 HISTORY — DX: Other specified disorders of penis: N48.89

## 2017-12-18 HISTORY — DX: Other specified abnormal findings of blood chemistry: R79.89

## 2017-12-18 LAB — BASIC METABOLIC PANEL
ANION GAP: 12 (ref 5–15)
BUN: 21 mg/dL (ref 8–23)
CO2: 27 mmol/L (ref 22–32)
Calcium: 9 mg/dL (ref 8.9–10.3)
Chloride: 102 mmol/L (ref 98–111)
Creatinine, Ser: 1.47 mg/dL — ABNORMAL HIGH (ref 0.61–1.24)
GFR calc Af Amer: 56 mL/min — ABNORMAL LOW (ref >60)
GFR calc non Af Amer: 48 mL/min — ABNORMAL LOW (ref >60)
GLUCOSE: 112 mg/dL — AB (ref 70–99)
POTASSIUM: 3.5 mmol/L (ref 3.5–5.1)
Sodium: 141 mmol/L (ref 135–145)

## 2017-12-18 LAB — HIV ANTIBODY (ROUTINE TESTING W REFLEX): HIV Screen 4th Generation wRfx: NONREACTIVE

## 2017-12-18 LAB — CBC
HEMATOCRIT: 45.3 % (ref 39.0–52.0)
HEMOGLOBIN: 15.1 g/dL (ref 13.0–17.0)
MCH: 27.6 pg (ref 26.0–34.0)
MCHC: 33.3 g/dL (ref 30.0–36.0)
MCV: 82.7 fL (ref 78.0–100.0)
Platelets: 201 10*3/uL (ref 150–400)
RBC: 5.48 MIL/uL (ref 4.22–5.81)
RDW: 14 % (ref 11.5–15.5)
WBC: 10 10*3/uL (ref 4.0–10.5)

## 2017-12-18 LAB — TSH: TSH: 0.258 u[IU]/mL — ABNORMAL LOW (ref 0.350–4.500)

## 2017-12-18 LAB — TROPONIN I
Troponin I: 0.08 ng/mL (ref <0.03)
Troponin I: 0.06 ng/mL (ref <0.03)

## 2017-12-18 LAB — ECHOCARDIOGRAM COMPLETE

## 2017-12-18 MED ORDER — HYDRALAZINE HCL 20 MG/ML IJ SOLN
10.0000 mg | INTRAMUSCULAR | Status: DC | PRN
  Administered 2017-12-18: 10 mg via INTRAVENOUS
  Filled 2017-12-18 (×2): qty 1

## 2017-12-18 MED ORDER — INFLUENZA VAC SPLIT HIGH-DOSE 0.5 ML IM SUSY: 0.5000 mL | PREFILLED_SYRINGE | INTRAMUSCULAR | Status: DC | PRN

## 2017-12-18 MED ORDER — HEPARIN SODIUM (PORCINE) 5000 UNIT/ML IJ SOLN
5000.0000 [IU] | Freq: Three times a day (TID) | INTRAMUSCULAR | Status: DC
  Administered 2017-12-18 – 2017-12-24 (×16): 5000 [IU] via SUBCUTANEOUS
  Filled 2017-12-18 (×16): qty 1

## 2017-12-18 MED ORDER — FUROSEMIDE 10 MG/ML IJ SOLN
40.0000 mg | Freq: Once | INTRAMUSCULAR | Status: AC
  Administered 2017-12-18: 40 mg via INTRAVENOUS
  Filled 2017-12-18: qty 4

## 2017-12-18 MED ORDER — NITROGLYCERIN IN D5W 200-5 MCG/ML-% IV SOLN
0.0000 ug/min | INTRAVENOUS | Status: DC
  Administered 2017-12-18: 5 ug/min via INTRAVENOUS
  Filled 2017-12-18: qty 250

## 2017-12-18 MED ORDER — ASPIRIN 325 MG PO TABS
325.0000 mg | ORAL_TABLET | Freq: Every day | ORAL | Status: DC
  Administered 2017-12-18: 325 mg via ORAL
  Filled 2017-12-18: qty 1

## 2017-12-18 MED ORDER — ASPIRIN EC 81 MG PO TBEC
81.0000 mg | DELAYED_RELEASE_TABLET | Freq: Every day | ORAL | Status: DC
  Administered 2017-12-19 – 2017-12-24 (×6): 81 mg via ORAL
  Filled 2017-12-18 (×6): qty 1

## 2017-12-18 MED ORDER — FUROSEMIDE 10 MG/ML IJ SOLN
40.0000 mg | Freq: Two times a day (BID) | INTRAMUSCULAR | Status: DC
  Administered 2017-12-18 – 2017-12-19 (×2): 40 mg via INTRAVENOUS
  Filled 2017-12-18 (×2): qty 4

## 2017-12-18 MED ORDER — SPIRONOLACTONE 25 MG PO TABS
25.0000 mg | ORAL_TABLET | Freq: Every day | ORAL | Status: DC
  Administered 2017-12-18 – 2017-12-22 (×5): 25 mg via ORAL
  Filled 2017-12-18 (×5): qty 1

## 2017-12-18 MED ORDER — CARVEDILOL 6.25 MG PO TABS
6.2500 mg | ORAL_TABLET | Freq: Two times a day (BID) | ORAL | Status: DC
  Administered 2017-12-18 – 2017-12-24 (×13): 6.25 mg via ORAL
  Filled 2017-12-18 (×14): qty 1

## 2017-12-18 NOTE — ED Notes (Signed)
Patient is resting comfortably. 

## 2017-12-18 NOTE — ED Notes (Signed)
[facility] called for transport. 

## 2017-12-18 NOTE — Progress Notes (Signed)
PROGRESS NOTE    Daniel Obrien  JEH:[id number] DOB: 1950-10-23 DOA: 12/17/2017 PCP: Patient, No Pcp Per   Brief Narrative:  67 y.o. [place] speak male, PMH of HTN but he has not seen doctors for years and not on any BP meds, only take PRN ibuprofen at home, p/w chest pain. He's found to have hypertensive emergency on arrival, requiring Cardene drip. Troponin is elevated at  0.08 and 0.06 and no known baseline. BNP 1450. Cr 1.71 and no baseline to compare. EKG showed sinus rhythm and LVH. CXR showed mild CHF with marked cardiomegaly and diffuse pulmonary edema. CTA chest, abd/pelvis ruled out aortic disection or aneurysm or acute vascular process; again seen CHF; mild to moderate left hydroureteronephrosis without obstructing left ureteral calculus, appearing to be more chronic than acute without surrounding perinephritic edema. TTE showed EF 30-35%. Cardiology consulted and determined to transfer patient to [facility] under cardiology service.   Assessment & Plan:   Principal Problem:   Hypertensive emergency Active Problems:   Acute CHF (congestive heart failure) (HCC)   Chest pain   ARF (acute renal failure) (HCC)   Hydroureteronephrosis, left, mild to moderate   Elevated troponin   Cardiomyopathy ([place]), new, EF 30-35%   Penile pain   Hyperthyroidism   Plan: -transfer patient to [facility] under Dr. Francesca Oman service and I have informed out flow manager;  -suspecting pt may have CHF/CM due to long term uncontrolled HTN, and need to rule out ischemic etiology; cardiology has initiated iv lasix X1 dose and further dosage pending renal function. Based on his EF, also initiated him on coreg. Hold off ACEI/ARB/aldactone due to renal failure -add iv PRN hydralazine for better BP control; pt is off Cardene drip -initiated aspirin  -avoid further contrast, no more NSAIDS, to preserve renal function; BMP daily -TSH is very suppressed. Will check T3 and T4. If confirmed hyperthyroidism, need to initiate  methimazole, together with beta blocker for hyperthyroidism treatment -pt c/o penile pain only during urination, but UA is only positive for microscopic hematuria. No ureteral stone per CT scan. Suspected he might passed the stone in last one week therefore having mild hydroureteronephrosis without seen the stone and having improved Cr without IVF.  -further management per cardiology; [facility] will sign off, but please feel free to call us with questions   DVT prophylaxis: heparin [place] Code Status: full Family Communication: son at bedside got updated Disposition Plan:  To be determined   Consultants:   cardiology  Procedures: ECHO   Antimicrobials:none   Subjective: No further chest pain. BP initially dropped below 100 then back to 160's. Without IVF or iv lasix, his Cr improving to 1.47 from 1.71. No SOB or PND or orthopnea  Objective: Vitals:   12/18/17 1200 12/18/17 1230 12/18/17 1330 12/18/17 1400  BP: (!) 163/98 (!) 152/102 (!) 166/106 (!) 166/106  Pulse: 68 66 68 65  Resp: (!) 23 (!) 24 19 (!) 23  Temp:      TempSrc:      SpO2: 93% 95% 95% 95%    Intake/Output Summary (Last 24 hours) at 12/18/2017 1435 Last data filed at 12/17/2017 2246 Gross per 24 hour  Intake 178.5 ml  Output 530 ml  Net -351.5 ml   There were no vitals filed for this visit.  Examination:  General exam: Appears calm and comfortable  Respiratory system: Clear to auscultation. Respiratory effort normal. Cardiovascular system: S1 & S2 heard, RRR. No JVD, murmurs, rubs, gallops or clicks. No pedal edema. Gastrointestinal system:  Abdomen is nondistended, soft and nontender. No organomegaly or masses felt. Normal bowel sounds heard. Central nervous system: Alert and oriented. No focal neurological deficits. Extremities: Symmetric 5 x 5 power. Skin: No rashes, lesions or ulcers Psychiatry: Judgement and insight appear normal. Mood & affect appropriate.  GU: penile has no obvious skin lesion or ulcer or  discharge. No pain on touch   Data Reviewed: I have personally reviewed following labs and imaging studies  CBC: Recent Labs  Lab 12/17/17 1610 12/18/17 0625  WBC 7.9 10.0  HGB 14.0 15.1  HCT 42.8 45.3  MCV 83.6 82.7  PLT 215 277   Basic Metabolic Panel: Recent Labs  Lab 12/17/17 1610 12/18/17 0625  NA 141 141  K 3.7 3.5  CL 105 102  CO2 29 27  GLUCOSE 150* 112*  BUN 24* 21  CREATININE 1.71* 1.47*  CALCIUM 8.7* 9.0   GFR: CrCl cannot be calculated (Unknown ideal weight.). Liver Function Tests: No results for input(s): AST, ALT, ALKPHOS, BILITOT, PROT, ALBUMIN in the last 168 hours. No results for input(s): LIPASE, AMYLASE in the last 168 hours. No results for input(s): AMMONIA in the last 168 hours. Coagulation Profile: No results for input(s): INR, PROTIME in the last 168 hours. Cardiac Enzymes: Recent Labs  Lab 12/17/17 2335 12/18/17 0625  TROPONINI 0.08* 0.06*   BNP (last 3 results) No results for input(s): PROBNP in the last 8760 hours. HbA1C: No results for input(s): HGBA1C in the last 72 hours. CBG: No results for input(s): GLUCAP in the last 168 hours. Lipid Profile: No results for input(s): CHOL, HDL, LDLCALC, TRIG, CHOLHDL, LDLDIRECT in the last 72 hours. Thyroid Function Tests: Recent Labs    12/17/17 2335  TSH 0.258*   Anemia Panel: No results for input(s): VITAMINB12, FOLATE, FERRITIN, TIBC, IRON, RETICCTPCT in the last 72 hours. Sepsis Labs: No results for input(s): PROCALCITON, LATICACIDVEN in the last 168 hours.  No results found for this or any previous visit (from the past 240 hour(s)).       Radiology Studies: Dg Chest 2 View  Result Date: 12/17/2017 CLINICAL DATA:  One day history of chest pain, shortness of breath and generalized body aches. EXAM: CHEST - 2 VIEW COMPARISON:  None. FINDINGS: AP SEMI-ERECT and LATERAL images were obtained. Cardiac silhouette markedly enlarged even allowing for AP technique. Thoracic aorta  tortuous and mildly atherosclerotic. Hilar and mediastinal contours otherwise unremarkable. Mild diffuse interstitial pulmonary edema as there are scattered Kerley B lines scattered throughout both lungs. No confluent airspace consolidation. No significant pleural effusions. Visualized bony thorax intact. IMPRESSION: Mild CHF, with marked cardiomegaly and mild diffuse interstitial pulmonary edema. Electronically Signed   By: Evangeline Dakin M.D.   On: 12/17/2017 17:23   [place] Lt Lower Extrem Ltd Soft Tissue Non Vascular  Result Date: 12/18/2017 CLINICAL DATA:  Left upper thigh mass. EXAM: ULTRASOUND LEFT LOWER EXTREMITY LIMITED TECHNIQUE: Ultrasound examination of the lower extremity soft tissues was performed in the area of clinical concern. COMPARISON:  CTA chest, abdomen, and pelvis 12/17/2017. FINDINGS: Targeted ultrasound in the region of clinical concern demonstrates an 8.9 x 3.4 x 4.7 cm subcutaneous soft tissue mass in the medial left upper thigh which is hyperechoic relative to adjacent muscle. No increased vascularity is evident within the mass on color Doppler imaging. The superior most aspect of a suspected lipoma is visible in this region on yesterday's CT. IMPRESSION: 9 cm medial left thigh mass likely representing a lipoma. Electronically Signed   By: Zenia Resides  Jeralyn Ruths M.D.   On: 12/18/2017 11:53   Ct Angio Chest/abd/pel For Dissection W And/or W/wo  Result Date: 12/17/2017 CLINICAL DATA:  Hypertension, shortness of breath, chest pain EXAM: CT ANGIOGRAPHY CHEST, ABDOMEN AND PELVIS TECHNIQUE: Multidetector CT imaging through the chest, abdomen and pelvis was performed using the standard protocol during bolus administration of intravenous contrast. Multiplanar reconstructed images and MIPs were obtained and reviewed to evaluate the vascular anatomy. CONTRAST:  53mL ISOVUE-370 IOPAMIDOL (ISOVUE-370) INJECTION 76% COMPARISON:  12/17/2017 FINDINGS: CTA CHEST FINDINGS Cardiovascular: Atherosclerosis of the  thoracic aorta. Negative for significant dissection, aneurysm, or hyperdense intramural hematoma. Patent tortuous three-vessel arch anatomy. No mediastinal hemorrhage or hematoma. Central pulmonary arteries appear patent. Is mildly enlarged. No pericardial effusion. Mediastinum/Nodes: Thyroid mildly heterogeneous. Trachea and central airways are patent. Esophagus is nondilated. No significant hernia. No adenopathy. Lungs/Pleura: Upper lobe interlobular septal thickening. Diffuse respiratory motion artifact. Mild scattered ground-glass opacity throughout both lungs. Low lung volumes with basilar vascular crowding. Appearance suggest early interstitial edema. No definite focal acute airspace process, collapse or consolidation. No interstitial lung disease. No pleural abnormality, effusion, or pneumothorax. Musculoskeletal: No acute osseous finding. No compression fracture. Sternum intact. Review of the MIP images confirms the above findings. CTA ABDOMEN AND PELVIS FINDINGS VASCULAR Aorta: Abdominal atherosclerosis and mild tortuosity without aneurysm, dissection, occlusive process or acute vascular finding. No retroperitoneal hemorrhage or hematoma. Celiac: Widely patent including its branches SMA: Widely patent including its branches Renals: Both main renal arteries are patent. IMA: Remains patent off the distal aorta including its branches Inflow: Atherosclerosis and tortuosity of the iliac vessels. Iliac vessels remain patent without inflow disease. The common, internal and external iliac arteries are all patent. Veins: Venous phase imaging not performed. Review of the MIP images confirms the above findings. NON-VASCULAR Hepatobiliary: No focal liver abnormality is seen. No gallstones, gallbladder wall thickening, or biliary dilatation. Pancreas: Unremarkable. No pancreatic ductal dilatation or surrounding inflammatory changes. Spleen: Normal in size without focal abnormality. Adrenals/Urinary Tract: Normal adrenal  glands. Right kidney and ureter demonstrate no acute process, obstructive uropathy, hydroureter, or ureteral calculus. Bladder unremarkable. Left kidney demonstrates chronic appearing mild to moderate hydronephrosis and proximal hydroureter without obstructing radiopaque left ureteral calculus. Stomach/Bowel: Negative for bowel obstruction, significant dilatation, ileus, or free air. Appendix not visualized. No acute inflammatory process, fluid collection, or abscess. Lymphatic: No adenopathy Reproductive: Unremarkable Other: No abdominal wall hernia or abnormality. No abdominopelvic ascites. Musculoskeletal: Degenerative changes of the spine. No acute osseous finding. Review of the MIP images confirms the above findings. IMPRESSION: Thoraco abdominal aortic atherosclerosis without acute dissection, significant aneurysm, occlusive disease or other acute vascular process. Cardiomegaly with mild pulmonary interstitial edema pattern suggesting early CHF or volume overload. Mild-to-moderate left hydroureteronephrosis without obstructing left ureteral calculus appearing more chronic than acute without surrounding perinephric edema. Consider nonemergent urology follow-up No other acute intra-abdominal or pelvic finding. Electronically Signed   By: Jerilynn Mages.  Shick M.D.   On: 12/17/2017 19:30        Scheduled Meds: . aspirin  325 mg Oral Daily  . carvedilol  6.25 mg Oral BID WC   Continuous Infusions:   LOS: 0 days    Time spent: 35 min    Paticia Stack, MD [facility] Pager [phone]  If 7PM-7AM, please contact night-coverage [email or web address] Password [email or web address] 12/18/2017, 2:35 PM

## 2017-12-18 NOTE — Consult Note (Addendum)
Cardiology Consultation:   Patient ID: Daniel Obrien; [id number]; 12/06/1950   Admit date: 12/17/2017 Date of Consult: 12/18/2017  Primary Care Provider: Patient, No Pcp Per Primary Cardiologist: New, Dr Meda Coffee Primary Electrophysiologist:  n/a   Patient Profile:   Daniel Obrien is a 67 y.o. male with a hx of HTN, DM, who is being seen today for the evaluation of chest pain at the request of Dr Crisoforo Oxford.  History of Present Illness:   [name] was interviewed through a [place] interpreter. He states he has had chest pain before, no diagnosis known.   The chest pain started 2 days ago.  Later he stated it had been going on for a long time.  It is on the Left. It is severe. Laying on the bed made it better. He would get it doing things around the house. It would last for minutes to hours. He would stay very still when the pain first started and it would go away.  He cannot describe the pain further.  The pain was worse with deep inspiration. He would have to sit in one place and not do anything. He had some diaphoresis, no N&V.   +orthopnea, does not know about PND or DOE.  He denies lower extremity edema.  He cannot say when the first episode of chest pain was.  The chest pain episodes were severe and coming more frequently, that is why he came to the hospital.  He feels better since he got here.    His initial blood pressure was 221/130.  However, he complains of R scrotal swelling x 4 years, no eval.  He has not seen a doctor in at least 2 years, is not on any prescription medications.  He does not know how long ago he was diagnosed with diabetes or hypertension.  He does not remember ever being told that his kidney function was abnormal.   Past Medical History:  Diagnosis Date  . Diabetes ([address])   . Hypertension     Past Surgical History:  Procedure Laterality Date  . NOSE SURGERY       Prior to Admission medications   Medication Sig Start Date End Date Taking? Authorizing  Provider  ibuprofen (ADVIL,MOTRIN) 200 MG tablet Take 200 mg by mouth every 6 (six) hours as needed for moderate pain.   Yes [provider]    Inpatient Medications: Scheduled Meds: . aspirin  325 mg Oral Daily  . carvedilol  6.25 mg Oral BID WC  . heparin injection (subcutaneous)  5,000 Units Subcutaneous Q8H   Continuous Infusions:  PRN Meds: acetaminophen **OR** acetaminophen, hydrALAZINE, labetalol, ondansetron **OR** ondansetron (ZOFRAN) IV  Allergies:   No Known Allergies  Social History:   Social History   Socioeconomic History  . Marital status: Married    Spouse name: Not on file  . Number of children: Not on file  . Years of education: Not on file  . Highest education level: Not on file  Occupational History  . Occupation: Retired  Scientific laboratory technician  . Financial resource strain: Not on file  . Food insecurity:    Worry: Not on file    Inability: Not on file  . Transportation needs:    Medical: Not on file    Non-medical: Not on file  Tobacco Use  . Smoking status: Never Smoker  . Smokeless tobacco: Never Used  Substance and Sexual Activity  . Alcohol use: Never    Frequency: Never  . Drug use: Never  . Sexual  activity: Not on file  Lifestyle  . Physical activity:    Days per week: Not on file    Minutes per session: Not on file  . Stress: Not on file  Relationships  . Social connections:    Talks on phone: Not on file    Gets together: Not on file    Attends religious service: Not on file    Active member of club or organization: Not on file    Attends meetings of clubs or organizations: Not on file    Relationship status: Not on file  . Intimate partner violence:    Fear of current or ex partner: Not on file    Emotionally abused: Not on file    Physically abused: Not on file    Forced sexual activity: Not on file  Other Topics Concern  . Not on file  Social History Narrative   Pt and wife are [place], [place]. Wife speaks a little  [place] and [place], a little [place], mostly [place].   Husband speaks more [place] and [place].    Family History:   Family History  Problem Relation Age of Onset  . Unexplained death Mother 76  . Unexplained death Father 86  . Hypertension Neg Hx   . Heart disease Neg Hx    Family Status:  Family Status  Relation Name Status  . Mother  Deceased  . Father  Deceased  . Neg Hx  (Not Specified)    ROS:  Please see the history of present illness.  All other ROS reviewed and negative.     Physical Exam/Data:   Vitals:   12/18/17 1500 12/18/17 1525 12/18/17 1600 12/18/17 1605  BP: (!) 187/104 (!) 190/124  (!) 162/106  Pulse: 69   66  Resp: 18   16  Temp:    98.1 F (36.7 C)  TempSrc:    Oral  SpO2: 97%   98%  Weight:   81.2 kg   Height:   5\' 6"  (1.676 m)     Intake/Output Summary (Last 24 hours) at 12/18/2017 1938 Last data filed at 12/18/2017 1600 Gross per 24 hour  Intake 178.5 ml  Output 655 ml  Net -476.5 ml   General:  Well nourished, well developed, in no acute distress HEENT: normal Lymph: no adenopathy Neck: JVD 10-12 cm Endocrine:  No thryomegaly Vascular: No carotid bruits; 4/4 extremity pulses 2+, without bruits  Cardiac:  normal S1, S2; RRR; no murmur, +S3 Lungs: Decreased breath sounds bases with some rales bilaterally, no wheezing, rhonchi   Abd: soft, nontender, no hepatomegaly  Ext: no LE, left groin has soft tissue mass extending along the cranium, left testicle is at the end of the mass, it is abnormally small Musculoskeletal:  No deformities, BUE and BLE strength normal and equal Skin: warm and dry  Neuro:  CNs 2-12 intact, no focal abnormalities noted Psych:  Normal affect   EKG:  The EKG was personally reviewed and demonstrates: 10/02, SR, HR 87, LVH w/ strain and early repol Telemetry:  Telemetry was personally reviewed and demonstrates:  SR, ST  Relevant CV Studies:  ECHO: Completed, images pending.  Preliminary report is EF  30-35%  Laboratory Data:  Chemistry Recent Labs  Lab 12/17/17 1610 12/18/17 0625  NA 141 141  K 3.7 3.5  CL 105 102  CO2 29 27  GLUCOSE 150* 112*  BUN 24* 21  CREATININE 1.71* 1.47*  CALCIUM 8.7* 9.0  GFRNONAA 40* 48*  GFRAA 47* 56*  ANIONGAP 7 12    No results found for: ALT, AST, GGT, ALKPHOS, BILITOT Hematology Recent Labs  Lab 12/17/17 1610 12/18/17 0625  WBC 7.9 10.0  RBC 5.12 5.48  HGB 14.0 15.1  HCT 42.8 45.3  MCV 83.6 82.7  MCH 27.3 27.6  MCHC 32.7 33.3  RDW 14.0 14.0  PLT 215 201   Cardiac Enzymes Recent Labs  Lab 12/17/17 2335 12/18/17 0625  TROPONINI 0.08* 0.06*    Recent Labs  Lab 12/17/17 1617 12/17/17 2012  TROPIPOC 0.07 0.08    BNP Recent Labs  Lab 12/17/17 1610  BNP 1,450.7*    TSH:  Lab Results  Component Value Date   TSH 0.258 (L) 12/17/2017    Radiology/Studies:  Dg Chest 2 View  Result Date: 12/17/2017 CLINICAL DATA:  One day history of chest pain, shortness of breath and generalized body aches. EXAM: CHEST - 2 VIEW COMPARISON:  None. FINDINGS: AP SEMI-ERECT and LATERAL images were obtained. Cardiac silhouette markedly enlarged even allowing for AP technique. Thoracic aorta tortuous and mildly atherosclerotic. Hilar and mediastinal contours otherwise unremarkable. Mild diffuse interstitial pulmonary edema as there are scattered Kerley B lines scattered throughout both lungs. No confluent airspace consolidation. No significant pleural effusions. Visualized bony thorax intact. IMPRESSION: Mild CHF, with marked cardiomegaly and mild diffuse interstitial pulmonary edema. Electronically Signed   By: Evangeline Dakin M.D.   On: 12/17/2017 17:23   [place] Lt Lower Extrem Ltd Soft Tissue Non Vascular  Result Date: 12/18/2017 CLINICAL DATA:  Left upper thigh mass. EXAM: ULTRASOUND LEFT LOWER EXTREMITY LIMITED TECHNIQUE: Ultrasound examination of the lower extremity soft tissues was performed in the area of clinical concern. COMPARISON:  CTA  chest, abdomen, and pelvis 12/17/2017. FINDINGS: Targeted ultrasound in the region of clinical concern demonstrates an 8.9 x 3.4 x 4.7 cm subcutaneous soft tissue mass in the medial left upper thigh which is hyperechoic relative to adjacent muscle. No increased vascularity is evident within the mass on color Doppler imaging. The superior most aspect of a suspected lipoma is visible in this region on yesterday's CT. IMPRESSION: 9 cm medial left thigh mass likely representing a lipoma. Electronically Signed   By: Logan Bores M.D.   On: 12/18/2017 11:53   Ct Angio Chest/abd/pel For Dissection W And/or W/wo  Result Date: 12/17/2017 CLINICAL DATA:  Hypertension, shortness of breath, chest pain EXAM: CT ANGIOGRAPHY CHEST, ABDOMEN AND PELVIS TECHNIQUE: Multidetector CT imaging through the chest, abdomen and pelvis was performed using the standard protocol during bolus administration of intravenous contrast. Multiplanar reconstructed images and MIPs were obtained and reviewed to evaluate the vascular anatomy. CONTRAST:  47mL ISOVUE-370 IOPAMIDOL (ISOVUE-370) INJECTION 76% COMPARISON:  12/17/2017 FINDINGS: CTA CHEST FINDINGS Cardiovascular: Atherosclerosis of the thoracic aorta. Negative for significant dissection, aneurysm, or hyperdense intramural hematoma. Patent tortuous three-vessel arch anatomy. No mediastinal hemorrhage or hematoma. Central pulmonary arteries appear patent. Is mildly enlarged. No pericardial effusion. Mediastinum/Nodes: Thyroid mildly heterogeneous. Trachea and central airways are patent. Esophagus is nondilated. No significant hernia. No adenopathy. Lungs/Pleura: Upper lobe interlobular septal thickening. Diffuse respiratory motion artifact. Mild scattered ground-glass opacity throughout both lungs. Low lung volumes with basilar vascular crowding. Appearance suggest early interstitial edema. No definite focal acute airspace process, collapse or consolidation. No interstitial lung disease. No  pleural abnormality, effusion, or pneumothorax. Musculoskeletal: No acute osseous finding. No compression fracture. Sternum intact. Review of the MIP images confirms the above findings. CTA ABDOMEN AND PELVIS FINDINGS VASCULAR Aorta: Abdominal atherosclerosis and mild tortuosity without aneurysm, dissection,  occlusive process or acute vascular finding. No retroperitoneal hemorrhage or hematoma. Celiac: Widely patent including its branches SMA: Widely patent including its branches Renals: Both main renal arteries are patent. IMA: Remains patent off the distal aorta including its branches Inflow: Atherosclerosis and tortuosity of the iliac vessels. Iliac vessels remain patent without inflow disease. The common, internal and external iliac arteries are all patent. Veins: Venous phase imaging not performed. Review of the MIP images confirms the above findings. NON-VASCULAR Hepatobiliary: No focal liver abnormality is seen. No gallstones, gallbladder wall thickening, or biliary dilatation. Pancreas: Unremarkable. No pancreatic ductal dilatation or surrounding inflammatory changes. Spleen: Normal in size without focal abnormality. Adrenals/Urinary Tract: Normal adrenal glands. Right kidney and ureter demonstrate no acute process, obstructive uropathy, hydroureter, or ureteral calculus. Bladder unremarkable. Left kidney demonstrates chronic appearing mild to moderate hydronephrosis and proximal hydroureter without obstructing radiopaque left ureteral calculus. Stomach/Bowel: Negative for bowel obstruction, significant dilatation, ileus, or free air. Appendix not visualized. No acute inflammatory process, fluid collection, or abscess. Lymphatic: No adenopathy Reproductive: Unremarkable Other: No abdominal wall hernia or abnormality. No abdominopelvic ascites. Musculoskeletal: Degenerative changes of the spine. No acute osseous finding. Review of the MIP images confirms the above findings. IMPRESSION: Thoraco abdominal  aortic atherosclerosis without acute dissection, significant aneurysm, occlusive disease or other acute vascular process. Cardiomegaly with mild pulmonary interstitial edema pattern suggesting early CHF or volume overload. Mild-to-moderate left hydroureteronephrosis without obstructing left ureteral calculus appearing more chronic than acute without surrounding perinephric edema. Consider nonemergent urology follow-up No other acute intra-abdominal or pelvic finding. Electronically Signed   By: Jerilynn Mages.  Shick M.D.   On: 12/17/2017 19:30    Assessment and Plan:   1.  Acute combined systolic and diastolic CHF: -Preliminary echo results are EF 30-35%.  He certainly has some diastolic dysfunction as well from untreated hypertension. - He has volume overload by exam, needs diuresis. - If okay with Dr. Meda Coffee, will start Lasix 40 mg a day IV. - Change the labetalol to carvedilol 6.25 mg twice daily - Cannot uptitrate the carvedilol for better blood pressure control, but do not wish to bring him down too much too soon as his blood pressure has been uncontrolled for a long time - Once his volume status is improved, decide on ischemic eval  2.  Renal insufficiency -Unknown duration -Follow renal function carefully  3.  Hypertensive emergency: -Blood pressure response well to medication, main problem is lack of treatment. -he was initially on IV Cardene in the emergency room, that has been discontinued -We will need to emphasize the need for medication compliance  4.  Chest pain: - Symptoms would occur with exertion, but that was in the setting of untreated CHF and hypertension - Considering the duration and severity of his symptoms, troponin elevation is minimal, more consistent with CHF - He will need an ischemic evaluation, but will defer this until his hypertension and CHF are better treated  5.  Diabetes: - Blood sugars here have not been that high, add sliding scale insulin and check a hemoglobin  A1c  6.  Left hydroureteronephrosis: -Seen on CT, per the radiologist it appears more chronic than acute -Outpatient follow-up with urology recommended  7.  Left upper thigh mass: - Curb sided ER MD who recommended ultrasound which is ordered. - No mention of any lower pelvic abnormality on CT -This may be related to the left hydroureteronephrosis, ultrasound may confirm this - Advised patient that he would likely have to see a doctor for  this, but we will defer any evaluation until his cardiac status is stabilized.  Principal Problem:   Hypertensive emergency Active Problems:   Acute CHF (congestive heart failure) (H[facility])   Chest pain   ARF (acute renal failure) (H[facility])   Hydroureteronephrosis, left, mild to moderate   Elevated troponin   Cardiomyopathy ([place]), new, EF 30-35%   Penile pain   Hyperthyroidism  For questions or updates, please contact [facility] Please consult [email or web address] for contact info under Cardiology/STEMI.   Signed, Ena Dawley, MD  12/18/2017 7:38 PM  Spoke with Dr. Meda Coffee, she recommends transfer to [facility] and cardiology assuming care.  Dr. Crisoforo Oxford was advised.  The patient was seen, examined and discussed with Rosaria Ferries, PA-C and I agree with the above.   67 y.o. male with a hx of HTN, DM, who is being seen today for the evaluation of chest pain. He is a very pleasant male of [place] origin. The chest pain and SOB started several months ago and was progressively worsening. The pain was worse with deep inspiration. +orthopnea, does not know about PND or DOE.  He denies lower extremity edema. His initial blood pressure was 221/130. He has not seen a doctor in at least 2 years, is not on any prescription medications.  Physical exam shows JVDs up to the jaws, S1,2, positive S4, crackles at both  Lung bases, no LE edema, good peripheral pulses.ECG shows SR, LVH with repol abnormalities.  Echo (personally reviewed) shows LVEF is approximately 30-35%  with diffuse  hypokinesis, grade 2 diastolic dysfunction with elevated filling pressures. The cavity size was severely dilated. Mild AI, mild to moderate MR, LA severely dilated, RV mildly dilated with moderately decreased systolic function.  BNP 1450, troponin 0.08-->0.06. Crea 1.71-->1.41.  We will start lasix 40 mg iv BID, add spironolactone 25 mg po daily, start NTG drip to control BP. Order renal arterial ultrasound, hold ACEI/ARB until crea improves. Troponin elevation - mild with flat trend - most probably sec to CHF and hypertensive urgency, I would not proceed with ischemic workup right now.   Ena Dawley, MD 12/18/2017

## 2017-12-18 NOTE — Progress Notes (Signed)
  Echocardiogram 2D Echocardiogram has been performed.  Daniel Obrien Daniel Obrien 12/18/2017, 9:46 AM

## 2017-12-18 NOTE — ED Notes (Signed)
Cardiology PA at bedside when BP of 213/134 was taken.  Pt denies any symptoms at the moment. IV hydralazine is ordered.

## 2017-12-18 NOTE — Progress Notes (Signed)
Used Interpreter service Lockie Pares [phone] for completion of admission data base

## 2017-12-19 ENCOUNTER — Inpatient Hospital Stay (HOSPITAL_COMMUNITY): Payer: Medicare Other

## 2017-12-19 DIAGNOSIS — N170 Acute kidney failure with tubular necrosis: Secondary | ICD-10-CM

## 2017-12-19 DIAGNOSIS — N184 Chronic kidney disease, stage 4 (severe): Secondary | ICD-10-CM

## 2017-12-19 LAB — URINE CULTURE: Culture: <10000 — AB

## 2017-12-19 LAB — BASIC METABOLIC PANEL
Anion gap: 14 (ref 5–15)
BUN: 28 mg/dL — AB (ref 8–23)
CHLORIDE: 100 mmol/L (ref 98–111)
CO2: 25 mmol/L (ref 22–32)
Calcium: 9.7 mg/dL (ref 8.9–10.3)
Creatinine, Ser: 2.13 mg/dL — ABNORMAL HIGH (ref 0.61–1.24)
GFR calc Af Amer: 36 mL/min — ABNORMAL LOW (ref >60)
GFR calc non Af Amer: 31 mL/min — ABNORMAL LOW (ref >60)
GLUCOSE: 111 mg/dL — AB (ref 70–99)
POTASSIUM: 3.6 mmol/L (ref 3.5–5.1)
Sodium: 139 mmol/L (ref 135–145)

## 2017-12-19 LAB — MRSA PCR SCREENING: MRSA by PCR: NEGATIVE

## 2017-12-19 LAB — GLUCOSE, CAPILLARY: Glucose-Capillary: 126 mg/dL — ABNORMAL HIGH (ref 70–99)

## 2017-12-19 MED ORDER — HYDRALAZINE HCL 25 MG PO TABS
25.0000 mg | ORAL_TABLET | Freq: Three times a day (TID) | ORAL | Status: DC
  Administered 2017-12-19 – 2017-12-24 (×15): 25 mg via ORAL
  Filled 2017-12-19 (×16): qty 1

## 2017-12-19 MED ORDER — ISOSORBIDE MONONITRATE ER 30 MG PO TB24
30.0000 mg | ORAL_TABLET | Freq: Every day | ORAL | Status: DC
  Administered 2017-12-19 – 2017-12-24 (×6): 30 mg via ORAL
  Filled 2017-12-19 (×6): qty 1

## 2017-12-19 NOTE — Progress Notes (Addendum)
Progress Note  Patient Name: Daniel Obrien Date of Encounter: 12/19/2017  Primary Cardiologist: New patient/Dr [place]   He feels slightly better today.  Inpatient Medications    Scheduled Meds: . aspirin EC  81 mg Oral Daily  . carvedilol  6.25 mg Oral BID WC  . furosemide  40 mg Intravenous BID  . heparin injection (subcutaneous)  5,000 Units Subcutaneous Q8H  . spironolactone  25 mg Oral Daily   Continuous Infusions: . nitroGLYCERIN 5 mcg/min (12/19/17 0300)   PRN Meds: acetaminophen **OR** acetaminophen, hydrALAZINE, Influenza vac split quadrivalent PF, labetalol, ondansetron **OR** ondansetron (ZOFRAN) IV   Vital Signs    Vitals:   12/19/17 0251 12/19/17 0305 12/19/17 0604 12/19/17 0806  BP: 125/86 (!) 143/84 (!) 149/76 (!) 130/100  Pulse: 66 65 80 64  Resp:      Temp:   97.9 F (36.6 C)   TempSrc:   Oral   SpO2: 93% 92% 94% 92%  Weight:   80.3 kg   Height:        Intake/Output Summary (Last 24 hours) at 12/19/2017 0930 Last data filed at 12/19/2017 0604 Gross per 24 hour  Intake 128.84 ml  Output 600 ml  Net -471.16 ml   Filed Weights   12/18/17 1600 12/19/17 0604  Weight: 81.2 kg 80.3 kg    Telemetry    SR- Personally Reviewed  ECG    SR, LVH - Personally Reviewed  Physical Exam   GEN: No acute distress.   Neck: JVD + 6 cm Cardiac: RRR, no murmurs, rubs, or gallops.  Respiratory: crackles at lung bases to auscultation bilaterally. GI: Soft, nontender, non-distended  MS: No edema; No deformity. Neuro:  Nonfocal  Psych: Normal affect   Labs    Chemistry Recent Labs  Lab 12/17/17 1610 12/18/17 0625 12/19/17 0435  NA 141 141 139  K 3.7 3.5 3.6  CL 105 102 100  CO2 29 27 25   GLUCOSE 150* 112* 111*  BUN 24* 21 28*  CREATININE 1.71* 1.47* 2.13*  CALCIUM 8.7* 9.0 9.7  GFRNONAA 40* 48* 31*  GFRAA 47* 56* 36*  ANIONGAP 7 12 14      Hematology Recent Labs  Lab 12/17/17 1610 12/18/17 0625  WBC 7.9 10.0  RBC 5.12 5.48   HGB 14.0 15.1  HCT 42.8 45.3  MCV 83.6 82.7  MCH 27.3 27.6  MCHC 32.7 33.3  RDW 14.0 14.0  PLT 215 201    Cardiac Enzymes Recent Labs  Lab 12/17/17 2335 12/18/17 0625  TROPONINI 0.08* 0.06*    Recent Labs  Lab 12/17/17 1617 12/17/17 2012  TROPIPOC 0.07 0.08     BNP Recent Labs  Lab 12/17/17 1610  BNP 1,450.7*     DDimer No results for input(s): DDIMER in the last 168 hours.   Radiology    Dg Chest 2 View  Result Date: 12/17/2017 CLINICAL DATA:  One day history of chest pain, shortness of breath and generalized body aches. EXAM: CHEST - 2 VIEW COMPARISON:  None. FINDINGS: AP SEMI-ERECT and LATERAL images were obtained. Cardiac silhouette markedly enlarged even allowing for AP technique. Thoracic aorta tortuous and mildly atherosclerotic. Hilar and mediastinal contours otherwise unremarkable. Mild diffuse interstitial pulmonary edema as there are scattered Kerley B lines scattered throughout both lungs. No confluent airspace consolidation. No significant pleural effusions. Visualized bony thorax intact. IMPRESSION: Mild CHF, with marked cardiomegaly and mild diffuse interstitial pulmonary edema. Electronically Signed   By: Evangeline Dakin M.D.   On:  12/17/2017 17:23   [place] Lt Lower Extrem Ltd Soft Tissue Non Vascular  Result Date: 12/18/2017 CLINICAL DATA:  Left upper thigh mass. EXAM: ULTRASOUND LEFT LOWER EXTREMITY LIMITED TECHNIQUE: Ultrasound examination of the lower extremity soft tissues was performed in the area of clinical concern. COMPARISON:  CTA chest, abdomen, and pelvis 12/17/2017. FINDINGS: Targeted ultrasound in the region of clinical concern demonstrates an 8.9 x 3.4 x 4.7 cm subcutaneous soft tissue mass in the medial left upper thigh which is hyperechoic relative to adjacent muscle. No increased vascularity is evident within the mass on color Doppler imaging. The superior most aspect of a suspected lipoma is visible in this region on yesterday's CT.  IMPRESSION: 9 cm medial left thigh mass likely representing a lipoma. Electronically Signed   By: Logan Bores M.D.   On: 12/18/2017 11:53   Ct Angio Chest/abd/pel For Dissection W And/or W/wo  Result Date: 12/17/2017 CLINICAL DATA:  Hypertension, shortness of breath, chest pain EXAM: CT ANGIOGRAPHY CHEST, ABDOMEN AND PELVIS TECHNIQUE: Multidetector CT imaging through the chest, abdomen and pelvis was performed using the standard protocol during bolus administration of intravenous contrast. Multiplanar reconstructed images and MIPs were obtained and reviewed to evaluate the vascular anatomy. CONTRAST:  1mL ISOVUE-370 IOPAMIDOL (ISOVUE-370) INJECTION 76% COMPARISON:  12/17/2017 FINDINGS: CTA CHEST FINDINGS Cardiovascular: Atherosclerosis of the thoracic aorta. Negative for significant dissection, aneurysm, or hyperdense intramural hematoma. Patent tortuous three-vessel arch anatomy. No mediastinal hemorrhage or hematoma. Central pulmonary arteries appear patent. Is mildly enlarged. No pericardial effusion. Mediastinum/Nodes: Thyroid mildly heterogeneous. Trachea and central airways are patent. Esophagus is nondilated. No significant hernia. No adenopathy. Lungs/Pleura: Upper lobe interlobular septal thickening. Diffuse respiratory motion artifact. Mild scattered ground-glass opacity throughout both lungs. Low lung volumes with basilar vascular crowding. Appearance suggest early interstitial edema. No definite focal acute airspace process, collapse or consolidation. No interstitial lung disease. No pleural abnormality, effusion, or pneumothorax. Musculoskeletal: No acute osseous finding. No compression fracture. Sternum intact. Review of the MIP images confirms the above findings. CTA ABDOMEN AND PELVIS FINDINGS VASCULAR Aorta: Abdominal atherosclerosis and mild tortuosity without aneurysm, dissection, occlusive process or acute vascular finding. No retroperitoneal hemorrhage or hematoma. Celiac: Widely patent  including its branches SMA: Widely patent including its branches Renals: Both main renal arteries are patent. IMA: Remains patent off the distal aorta including its branches Inflow: Atherosclerosis and tortuosity of the iliac vessels. Iliac vessels remain patent without inflow disease. The common, internal and external iliac arteries are all patent. Veins: Venous phase imaging not performed. Review of the MIP images confirms the above findings. NON-VASCULAR Hepatobiliary: No focal liver abnormality is seen. No gallstones, gallbladder wall thickening, or biliary dilatation. Pancreas: Unremarkable. No pancreatic ductal dilatation or surrounding inflammatory changes. Spleen: Normal in size without focal abnormality. Adrenals/Urinary Tract: Normal adrenal glands. Right kidney and ureter demonstrate no acute process, obstructive uropathy, hydroureter, or ureteral calculus. Bladder unremarkable. Left kidney demonstrates chronic appearing mild to moderate hydronephrosis and proximal hydroureter without obstructing radiopaque left ureteral calculus. Stomach/Bowel: Negative for bowel obstruction, significant dilatation, ileus, or free air. Appendix not visualized. No acute inflammatory process, fluid collection, or abscess. Lymphatic: No adenopathy Reproductive: Unremarkable Other: No abdominal wall hernia or abnormality. No abdominopelvic ascites. Musculoskeletal: Degenerative changes of the spine. No acute osseous finding. Review of the MIP images confirms the above findings. IMPRESSION: Thoraco abdominal aortic atherosclerosis without acute dissection, significant aneurysm, occlusive disease or other acute vascular process. Cardiomegaly with mild pulmonary interstitial edema pattern suggesting early CHF or volume overload. Mild-to-moderate  left hydroureteronephrosis without obstructing left ureteral calculus appearing more chronic than acute without surrounding perinephric edema. Consider nonemergent urology follow-up No  other acute intra-abdominal or pelvic finding. Electronically Signed   By: Jerilynn Mages.  Shick M.D.   On: 12/17/2017 19:30    Cardiac Studies   TTE: 12/18/2017 Left ventricle: LVEF is approximately 30% with diffuse   hypokinesis. The cavity size was severely dilated. Wall thickness   was normal. - Aortic valve: There was mild regurgitation. - Mitral valve: There was mild to moderate regurgitation. - Left atrium: The atrium was severely dilated. - Right ventricle: The cavity size was mildly dilated. Systolic   function was moderately reduced. - Right atrium: The atrium was mildly dilated.   Patient Profile     67 y.o. male with a hx of HTN, DM, who is being seen today for the evaluation of chest pain. He is a very pleasant male of [place] origin. The chest pain and SOB started several months ago and was progressively worsening. The pain was worse with deep inspiration. +orthopnea, does not know about PND or DOE.  He denies lower extremity edema. His initial blood pressure was 221/130. He has not seen a doctor in at least 2 years, is not on any prescription medications.  Assessment & Plan    1. Acute combines systolic and diastolic CHF 2. New cardiomyopathy, unknown etiology, will plan for ischemic workup later 3. Acute on chronic kidney failure  He remains mildly fluid overloaded, however creatinine significantly increased today from 1.4-2.1, I will hold Lasix today, this is possibly secondary to hypertensive urgency in the last couple of days. Also CT abdomen shows:  chronic appearing mild to moderate hydronephrosis and proximal hydroureter without obstructing radiopaque left ureteral calculus in the left kidney. We will also order renal arterial US. I will continue low-dose spironolactone, I will switch nitroglycerin drip to Imdur 30 mg daily and add hydralazine 25 mg p.o. 3 times daily. Hold ACEI/ARB until crea improves. Troponin elevation - mild with flat trend - most probably sec to CHF  and hypertensive urgency, I would wait with ischemic till his volume is optimized.   For questions or updates, please contact [place] Please consult [email or web address] for contact info under     Signed, Ena Dawley, MD  12/19/2017, 9:30 AM

## 2017-12-19 NOTE — Consult Note (Signed)
Reason for Consult: Acute on Chronic Renal Insuficiency, Left Mild Hydronephrosis, Microscopic Hematuria  Referring Physician:   Jayln Madeira is an 67 y.o. male.   HPI:   1 - Acute on Chronic Renal Insuficiency - Cr 2.1 up from recent baselien 1.4 after CT PE study with large contrast load. Some mild left hydro as per above.   2 - Left Mild Hydronephrosis - mild left hydro to distal ureter by ER PE CT study 12/17/17. No masses / stones. No h/o stones or Gu surgery.  No delayed nephrogram with f/u KUB after contrast CT.   3 - Microscopic Hematuria - blood on UA noted by ER 12/2017.   4 - Prostate Screening - No FHX prostate cancer. No prior PSA data for review.   PMH sig for CHF/Cardiomyopathy (BNP>1k), no prior surgeries. No PCP. He is non-English speaking. His son (lives in [place]) is involved.   Today "Daniel Obrien" is seen in consultation for above.     Past Medical History:  Diagnosis Date  . Diabetes ([place])   . Hypertension     Past Surgical History:  Procedure Laterality Date  . NOSE SURGERY      Family History  Problem Relation Age of Onset  . Unexplained death Mother 12  . Unexplained death Father 11  . Hypertension Neg Hx   . Heart disease Neg Hx     Social History:  reports that he has never smoked. He has never used smokeless tobacco. He reports that he does not drink alcohol or use drugs.  Allergies: No Known Allergies  Medications: I have reviewed the patient's current medications.  Results for orders placed or performed during the hospital encounter of 12/17/17 (from the past 48 hour(s))  Basic metabolic panel     Status: Abnormal   Collection Time: 12/17/17  4:10 PM  Result Value Ref Range   Sodium 141 135 - 145 mmol/L   Potassium 3.7 3.5 - 5.1 mmol/L   Chloride 105 98 - 111 mmol/L   CO2 29 22 - 32 mmol/L   Glucose, Bld 150 (H) 70 - 99 mg/dL   BUN 24 (H) 8 - 23 mg/dL   Creatinine, Ser 1.71 (H) 0.61 - 1.24 mg/dL   Calcium 8.7 (L) 8.9 - 10.3 mg/dL   GFR  calc non Af Amer 40 (L) >60 mL/min   GFR calc Af Amer 47 (L) >60 mL/min    Comment: (NOTE) The eGFR has been calculated using the CKD EPI equation. This calculation has not been validated in all clinical situations. eGFR's persistently <60 mL/min signify possible Chronic Kidney Disease.    Anion gap 7 5 - 15    Comment: Performed at [facility], [place] [address], [place], [place] [address]  CBC     Status: None   Collection Time: 12/17/17  4:10 PM  Result Value Ref Range   WBC 7.9 4.0 - 10.5 K/uL   RBC 5.12 4.22 - 5.81 MIL/uL   Hemoglobin 14.0 13.0 - 17.0 g/dL   HCT 42.8 39.0 - 52.0 %   MCV 83.6 78.0 - 100.0 fL   MCH 27.3 26.0 - 34.0 pg   MCHC 32.7 30.0 - 36.0 g/dL   RDW 14.0 11.5 - 15.5 %   Platelets 215 150 - 400 K/uL    Comment: Performed at [facility], [place] [address], [place], [place] [address]  Brain natriuretic peptide     Status: Abnormal   Collection Time: 12/17/17  4:10 PM  Result Value Ref Range   B Natriuretic Peptide 1,450.7 (H) 0.0 - 100.0 pg/mL    Comment: Performed at [facility], [place] [address], [place], [place] [address]  I-stat troponin, ED     Status: None   Collection Time: 12/17/17  4:17 PM  Result Value Ref Range   Troponin i, poc 0.07 0.00 - 0.08 ng/mL   Comment 3            Comment: Due to the release kinetics of cTnI, a negative result within the first hours of the onset of symptoms does not rule out myocardial infarction with certainty. If myocardial infarction is still suspected, repeat the test at appropriate intervals.   POCT i-Stat troponin I     Status: None   Collection Time: 12/17/17  8:12 PM  Result Value Ref Range   Troponin i, poc 0.08 0.00 - 0.08 ng/mL   Comment 3            Comment: Due to the release kinetics of cTnI, a negative result within the first hours of the onset of symptoms does not rule out myocardial infarction with certainty. If myocardial infarction is still  suspected, repeat the test at appropriate intervals.   Urinalysis, Routine w reflex microscopic     Status: Abnormal   Collection Time: 12/17/17  9:30 PM  Result Value Ref Range   Color, Urine STRAW (A) YELLOW   APPearance CLEAR CLEAR   Specific Gravity, Urine 1.015 1.005 - 1.030   pH 8.0 5.0 - 8.0   Glucose, UA NEGATIVE NEGATIVE mg/dL   Hgb urine dipstick MODERATE (A) NEGATIVE   Bilirubin Urine NEGATIVE NEGATIVE   Ketones, ur 5 (A) NEGATIVE mg/dL   Protein, ur NEGATIVE NEGATIVE mg/dL   Nitrite NEGATIVE NEGATIVE   Leukocytes, UA NEGATIVE NEGATIVE   RBC / HPF 11-20 0 - 5 RBC/hpf   WBC, UA 0-5 0 - 5 WBC/hpf   Bacteria, UA NONE SEEN NONE SEEN    Comment: Performed at [facility], [place] [address] [place], [place] [address]  Urine culture     Status: Abnormal   Collection Time: 12/17/17  9:30 PM  Result Value Ref Range   Specimen Description      URINE, CLEAN CATCH Performed at [facility], [place] [address], [place], [place] [address]    Special Requests      NONE Performed at [facility], [place] [address], [place],  [address]    Culture (A)     <10,000 COLONIES/mL INSIGNIFICANT GROWTH Performed at [place] [address] [place],  [address]    Report Status 12/19/2017 FINAL   HIV antibody (Routine Testing)     Status: None   Collection Time: 12/17/17 11:35 PM  Result Value Ref Range   HIV Screen 4th Generation wRfx Non Reactive Non Reactive    Comment: (NOTE) Performed At: [facility] [place], [place] [id number] Rush Farmer MD BZ:[id number]   TSH     Status: Abnormal   Collection Time: 12/17/17 11:35 PM  Result Value Ref Range   TSH 0.258 (L) 0.350 - 4.500 uIU/mL    Comment: Performed by a 3rd Generation assay with a functional sensitivity of <=0.01 uIU/mL. Performed at [facility], [place] [address], [place],  [address]   Troponin I      Status: Abnormal   Collection Time: 12/17/17 11:35 PM  Result Value Ref Range  Troponin I 0.08 (HH) <0.03 ng/mL    Comment: CRITICAL RESULT CALLED TO, READ BACK BY AND VERIFIED WITH: Marchelle Folks RN 0100 12/18/17 A NAVARRO Performed at [facility], [place] [address], [place], [place] [address]   Basic metabolic panel     Status: Abnormal   Collection Time: 12/18/17  6:25 AM  Result Value Ref Range   Sodium 141 135 - 145 mmol/L   Potassium 3.5 3.5 - 5.1 mmol/L   Chloride 102 98 - 111 mmol/L   CO2 27 22 - 32 mmol/L   Glucose, Bld 112 (H) 70 - 99 mg/dL   BUN 21 8 - 23 mg/dL   Creatinine, Ser 1.47 (H) 0.61 - 1.24 mg/dL   Calcium 9.0 8.9 - 10.3 mg/dL   GFR calc non Af Amer 48 (L) >60 mL/min   GFR calc Af Amer 56 (L) >60 mL/min    Comment: (NOTE) The eGFR has been calculated using the CKD EPI equation. This calculation has not been validated in all clinical situations. eGFR's persistently <60 mL/min signify possible Chronic Kidney Disease.    Anion gap 12 5 - 15    Comment: Performed at [facility], [place] [address] [place], [place] [address]  CBC     Status: None   Collection Time: 12/18/17  6:25 AM  Result Value Ref Range   WBC 10.0 4.0 - 10.5 K/uL   RBC 5.48 4.22 - 5.81 MIL/uL   Hemoglobin 15.1 13.0 - 17.0 g/dL   HCT 45.3 39.0 - 52.0 %   MCV 82.7 78.0 - 100.0 fL   MCH 27.6 26.0 - 34.0 pg   MCHC 33.3 30.0 - 36.0 g/dL   RDW 14.0 11.5 - 15.5 %   Platelets 201 150 - 400 K/uL    Comment: Performed at [facility], [place] [address], [place], [place] [address]  Troponin I     Status: Abnormal   Collection Time: 12/18/17  6:25 AM  Result Value Ref Range   Troponin I 0.06 (HH) <0.03 ng/mL    Comment: CRITICAL VALUE NOTED.  VALUE IS CONSISTENT WITH PREVIOUSLY REPORTED AND CALLED VALUE. Performed at [facility], [place] [address], [place], [place] [address]   Glucose, capillary     Status: Abnormal   Collection  Time: 12/18/17  3:34 PM  Result Value Ref Range   Glucose-Capillary 126 (H) 70 - 99 mg/dL  MRSA PCR Screening     Status: None   Collection Time: 12/19/17  1:15 AM  Result Value Ref Range   MRSA by PCR NEGATIVE NEGATIVE    Comment:        The GeneXpert MRSA Assay (FDA approved for NASAL specimens only), is one component of a comprehensive MRSA colonization surveillance program. It is not intended to diagnose MRSA infection nor to guide or monitor treatment for MRSA infections. Performed at [facility], [place] [address] [place], [place] [address]   Basic metabolic panel     Status: Abnormal   Collection Time: 12/19/17  4:35 AM  Result Value Ref Range   Sodium 139 135 - 145 mmol/L   Potassium 3.6 3.5 - 5.1 mmol/L   Chloride 100 98 - 111 mmol/L   CO2 25 22 - 32 mmol/L   Glucose, Bld 111 (H) 70 - 99 mg/dL   BUN 28 (H) 8 - 23 mg/dL   Creatinine, Ser 2.13 (H) 0.61 - 1.24 mg/dL   Calcium 9.7 8.9 - 10.3 mg/dL  GFR calc non Af Amer 31 (L) >60 mL/min   GFR calc Af Amer 36 (L) >60 mL/min    Comment: (NOTE) The eGFR has been calculated using the CKD EPI equation. This calculation has not been validated in all clinical situations. eGFR's persistently <60 mL/min signify possible Chronic Kidney Disease.    Anion gap 14 5 - 15    Comment: Performed at [place] [address] [place], [place] [address]    Dg Chest 2 View  Result Date: 12/17/2017 CLINICAL DATA:  One day history of chest pain, shortness of breath and generalized body aches. EXAM: CHEST - 2 VIEW COMPARISON:  None. FINDINGS: AP SEMI-ERECT and LATERAL images were obtained. Cardiac silhouette markedly enlarged even allowing for AP technique. Thoracic aorta tortuous and mildly atherosclerotic. Hilar and mediastinal contours otherwise unremarkable. Mild diffuse interstitial pulmonary edema as there are scattered Kerley B lines scattered throughout both lungs. No confluent airspace consolidation. No significant  pleural effusions. Visualized bony thorax intact. IMPRESSION: Mild CHF, with marked cardiomegaly and mild diffuse interstitial pulmonary edema. Electronically Signed   By: Evangeline Dakin M.D.   On: 12/17/2017 17:23   [place] Lt Lower Extrem Ltd Soft Tissue Non Vascular  Result Date: 12/18/2017 CLINICAL DATA:  Left upper thigh mass. EXAM: ULTRASOUND LEFT LOWER EXTREMITY LIMITED TECHNIQUE: Ultrasound examination of the lower extremity soft tissues was performed in the area of clinical concern. COMPARISON:  CTA chest, abdomen, and pelvis 12/17/2017. FINDINGS: Targeted ultrasound in the region of clinical concern demonstrates an 8.9 x 3.4 x 4.7 cm subcutaneous soft tissue mass in the medial left upper thigh which is hyperechoic relative to adjacent muscle. No increased vascularity is evident within the mass on color Doppler imaging. The superior most aspect of a suspected lipoma is visible in this region on yesterday's CT. IMPRESSION: 9 cm medial left thigh mass likely representing a lipoma. Electronically Signed   By: Logan Bores M.D.   On: 12/18/2017 11:53   Ct Angio Chest/abd/pel For Dissection W And/or W/wo  Result Date: 12/17/2017 CLINICAL DATA:  Hypertension, shortness of breath, chest pain EXAM: CT ANGIOGRAPHY CHEST, ABDOMEN AND PELVIS TECHNIQUE: Multidetector CT imaging through the chest, abdomen and pelvis was performed using the standard protocol during bolus administration of intravenous contrast. Multiplanar reconstructed images and MIPs were obtained and reviewed to evaluate the vascular anatomy. CONTRAST:  72m ISOVUE-370 IOPAMIDOL (ISOVUE-370) INJECTION 76% COMPARISON:  12/17/2017 FINDINGS: CTA CHEST FINDINGS Cardiovascular: Atherosclerosis of the thoracic aorta. Negative for significant dissection, aneurysm, or hyperdense intramural hematoma. Patent tortuous three-vessel arch anatomy. No mediastinal hemorrhage or hematoma. Central pulmonary arteries appear patent. Is mildly enlarged. No pericardial  effusion. Mediastinum/Nodes: Thyroid mildly heterogeneous. Trachea and central airways are patent. Esophagus is nondilated. No significant hernia. No adenopathy. Lungs/Pleura: Upper lobe interlobular septal thickening. Diffuse respiratory motion artifact. Mild scattered ground-glass opacity throughout both lungs. Low lung volumes with basilar vascular crowding. Appearance suggest early interstitial edema. No definite focal acute airspace process, collapse or consolidation. No interstitial lung disease. No pleural abnormality, effusion, or pneumothorax. Musculoskeletal: No acute osseous finding. No compression fracture. Sternum intact. Review of the MIP images confirms the above findings. CTA ABDOMEN AND PELVIS FINDINGS VASCULAR Aorta: Abdominal atherosclerosis and mild tortuosity without aneurysm, dissection, occlusive process or acute vascular finding. No retroperitoneal hemorrhage or hematoma. Celiac: Widely patent including its branches SMA: Widely patent including its branches Renals: Both main renal arteries are patent. IMA: Remains patent off the distal aorta including its branches Inflow: Atherosclerosis and tortuosity  of the iliac vessels. Iliac vessels remain patent without inflow disease. The common, internal and external iliac arteries are all patent. Veins: Venous phase imaging not performed. Review of the MIP images confirms the above findings. NON-VASCULAR Hepatobiliary: No focal liver abnormality is seen. No gallstones, gallbladder wall thickening, or biliary dilatation. Pancreas: Unremarkable. No pancreatic ductal dilatation or surrounding inflammatory changes. Spleen: Normal in size without focal abnormality. Adrenals/Urinary Tract: Normal adrenal glands. Right kidney and ureter demonstrate no acute process, obstructive uropathy, hydroureter, or ureteral calculus. Bladder unremarkable. Left kidney demonstrates chronic appearing mild to moderate hydronephrosis and proximal hydroureter without  obstructing radiopaque left ureteral calculus. Stomach/Bowel: Negative for bowel obstruction, significant dilatation, ileus, or free air. Appendix not visualized. No acute inflammatory process, fluid collection, or abscess. Lymphatic: No adenopathy Reproductive: Unremarkable Other: No abdominal wall hernia or abnormality. No abdominopelvic ascites. Musculoskeletal: Degenerative changes of the spine. No acute osseous finding. Review of the MIP images confirms the above findings. IMPRESSION: Thoraco abdominal aortic atherosclerosis without acute dissection, significant aneurysm, occlusive disease or other acute vascular process. Cardiomegaly with mild pulmonary interstitial edema pattern suggesting early CHF or volume overload. Mild-to-moderate left hydroureteronephrosis without obstructing left ureteral calculus appearing more chronic than acute without surrounding perinephric edema. Consider nonemergent urology follow-up No other acute intra-abdominal or pelvic finding. Electronically Signed   By: Jerilynn Mages.  Shick M.D.   On: 12/17/2017 19:30    Review of Systems  Constitutional: Negative.  Negative for chills and fever.  HENT: Negative.   Respiratory: Positive for shortness of breath and wheezing.   Cardiovascular: Negative.   Gastrointestinal: Negative.   Genitourinary: Negative.  Negative for flank pain.  Musculoskeletal: Negative.   Skin: Negative.   Neurological: Negative.   Endo/Heme/Allergies: Negative.   Psychiatric/Behavioral: Negative.    Blood pressure 120/80, pulse 74, temperature 98.6 F (37 C), temperature source Oral, resp. rate 20, height _0  (1.676 m), weight 80.3 kg, SpO2 96 %. Physical Exam  Constitutional: He appears well-developed and well-nourished.  P[place]. Non-English speaking. Son helps translate.   Neck: Normal range of motion.  Cardiovascular: Normal rate.  Respiratory: Effort normal.  GI: Soft.  Genitourinary: Penis normal.  Genitourinary Comments: No CVAT   Musculoskeletal: Normal range of motion.  Neurological: He is alert.  Skin: Skin is warm.  Psychiatric: He has a normal mood and affect.    Assessment/Plan:  1 - Acute on Chronic Renal Insuficiency - suspect multifactorial with some baseline medical renal disease exacerbated by contrast load. Some mild left renal obstruction possible, but felt to be much less likely given symmetric nephrogram and no dleayed nephrogram by renal imaging and f/u KUB (no residual contrast present)  2 - Left Mild Hydronephrosis - DDX mild reflux (had full bladder at time), v. Recently passed stone, v. Other intraluminal obstruction. Consider outpatient eletive eval with cysto / retrogrades / diagnostic ureteroscopy to r/o intraluminal lesions / complete hemturia eval. DO not favor in acute setting given ongoing cardaic eval at this is not severe.   3 - Microscopic Hematuria - Renal imaging w/o stones / mass. Will complete eval with cysto / retrogrades.   4 - Prostate Screening - screen in elective setting.     Chibuike Fleek 12/19/2017, 2:43 PM

## 2017-12-19 NOTE — Plan of Care (Signed)

## 2017-12-20 NOTE — Progress Notes (Signed)
Progress Note  Patient Name: Daniel Obrien Date of Encounter: 12/20/2017  Primary Cardiologist: Ena Dawley, MD   Subjective   Feels a little bit better, less short of breath, no chest pain, still not at baseline  Inpatient Medications    Scheduled Meds: . aspirin EC  81 mg Oral Daily  . carvedilol  6.25 mg Oral BID WC  . heparin injection (subcutaneous)  5,000 Units Subcutaneous Q8H  . hydrALAZINE  25 mg Oral TID  . isosorbide mononitrate  30 mg Oral Daily  . spironolactone  25 mg Oral Daily   Continuous Infusions:  PRN Meds: acetaminophen **OR** acetaminophen, hydrALAZINE, Influenza vac split quadrivalent PF, ondansetron **OR** ondansetron (ZOFRAN) IV   Vital Signs    Vitals:   12/19/17 2243 12/19/17 2245 12/20/17 0659 12/20/17 1038  BP: (!) 124/93 (!) 124/93 133/85 126/76  Pulse:   75 80  Resp:   19   Temp:   97.6 F (36.4 C)   TempSrc:   Oral   SpO2:   97%   Weight:   80.9 kg   Height:        Intake/Output Summary (Last 24 hours) at 12/20/2017 1145 Last data filed at 12/20/2017 0345 Gross per 24 hour  Intake 720 ml  Output 275 ml  Net 445 ml   Filed Weights   12/18/17 1600 12/19/17 0604 12/20/17 0659  Weight: 81.2 kg 80.3 kg 80.9 kg    Telemetry    Normal sinus rhythm without any adverse arrhythmias- Personally Reviewed  ECG    Normal sinus rhythm with left ventricular hypertrophy- Personally Reviewed  Physical Exam   GEN: No acute distress.   Neck: + Mid neck JVD Cardiac: RRR, no murmurs, rubs, or gallops.  Respiratory:  Crackles basal bilaterally. GI: Soft, nontender, non-distended protuberant MS: No edema; No deformity. Neuro:  Nonfocal  Psych: Normal affect   Labs    Chemistry Recent Labs  Lab 12/17/17 1610 12/18/17 0625 12/19/17 0435  NA 141 141 139  K 3.7 3.5 3.6  CL 105 102 100  CO2 29 27 25   GLUCOSE 150* 112* 111*  BUN 24* 21 28*  CREATININE 1.71* 1.47* 2.13*  CALCIUM 8.7* 9.0 9.7  GFRNONAA 40* 48* 31*  GFRAA 47*  56* 36*  ANIONGAP 7 12 14      Hematology Recent Labs  Lab 12/17/17 1610 12/18/17 0625  WBC 7.9 10.0  RBC 5.12 5.48  HGB 14.0 15.1  HCT 42.8 45.3  MCV 83.6 82.7  MCH 27.3 27.6  MCHC 32.7 33.3  RDW 14.0 14.0  PLT 215 201    Cardiac Enzymes Recent Labs  Lab 12/17/17 2335 12/18/17 0625  TROPONINI 0.08* 0.06*    Recent Labs  Lab 12/17/17 1617 12/17/17 2012  TROPIPOC 0.07 0.08     BNP Recent Labs  Lab 12/17/17 1610  BNP 1,450.7*     DDimer No results for input(s): DDIMER in the last 168 hours.   Radiology    Dg Abd Portable 1v  Result Date: 12/19/2017 CLINICAL DATA:  Hydronephrosis.  Penile pain with urination. EXAM: PORTABLE ABDOMEN - 1 VIEW COMPARISON:  None. FINDINGS: The bowel gas pattern is normal. Degenerative changes are evident at L4-5. Axial skeleton is otherwise unremarkable. IMPRESSION: 1. Normal bowel gas pattern. 2. Degenerative changes within the lumbar spine. Electronically Signed   By: San Morelle M.D.   On: 12/19/2017 15:29    Cardiac Studies   TTE: 12/18/2017 Left ventricle: LVEF is approximately 30% with diffuse hypokinesis. The  cavity size was severely dilated. Wall thickness was normal. - Aortic valve: There was mild regurgitation. - Mitral valve: There was mild to moderate regurgitation. - Left atrium: The atrium was severely dilated. - Right ventricle: The cavity size was mildly dilated. Systolic function was moderately reduced. - Right atrium: The atrium was mildly dilated  Patient Profile     67 y.o. male with acute systolic diastolic heart failure unknown etiology possibly hypertensive with pleuritic chest pain, orthopnea, acute kidney injury  Assessment & Plan    Acute systolic and diastolic heart failure - Creatinine 2.1 up from 1.4. -Urology consultation with Dr. Tresa Moore reviewed.  Mild left hydronephrosis.  Possibly contrast mediated acute kidney injury. -EF 30%.  No evidence of dissection on CT scan.  Chest  pain atypical.  Troponin low level likely secondary to demand ischemia in the setting of systolic heart failure. -Holding ACE inhibitor/ARB until creatinine improves. - Renal arterial ultrasound has been ordered by Dr. Meda Coffee to look for signs of atherosclerosis that may have led to increase.  Blood pressure currently under excellent control. -If labs are stable today, would encourage resuming IV Lasix 40 mg IV twice daily perhaps for one more day.  Continue to monitor basic metabolic profile. Laying more comfortably at rest in bed.  - Plan is to continue with ischemic work-up as an outpatient.  9 cm left thigh lipoma on ultrasound      For questions or updates, please contact [place] Please consult [email or web address] for contact info under        Signed, Candee Furbish, MD  12/20/2017, 11:45 AM

## 2017-12-21 ENCOUNTER — Inpatient Hospital Stay (HOSPITAL_COMMUNITY): Payer: Medicare Other

## 2017-12-21 ENCOUNTER — Other Ambulatory Visit: Payer: Self-pay

## 2017-12-21 LAB — CBC WITH DIFFERENTIAL/PLATELET
ABS IMMATURE GRANULOCYTES: 0 10*3/uL (ref 0.0–0.1)
BASOS ABS: 0 10*3/uL (ref 0.0–0.1)
BASOS PCT: 0 %
EOS PCT: 1 %
Eosinophils Absolute: 0.1 10*3/uL (ref 0.0–0.7)
HCT: 44.6 % (ref 39.0–52.0)
HEMOGLOBIN: 14.3 g/dL (ref 13.0–17.0)
Immature Granulocytes: 0 %
LYMPHS PCT: 3 %
Lymphs Abs: 0.3 10*3/uL — ABNORMAL LOW (ref 0.7–4.0)
MCH: 27 pg (ref 26.0–34.0)
MCHC: 32.1 g/dL (ref 30.0–36.0)
MCV: 84.3 fL (ref 78.0–100.0)
Monocytes Absolute: 0 10*3/uL — ABNORMAL LOW (ref 0.1–1.0)
Monocytes Relative: 1 %
NEUTROS ABS: 7.7 10*3/uL (ref 1.7–7.7)
Neutrophils Relative %: 95 %
PLATELETS: 160 10*3/uL (ref 150–400)
RBC: 5.29 MIL/uL (ref 4.22–5.81)
RDW: 13.8 % (ref 11.5–15.5)
WBC: 8.1 10*3/uL (ref 4.0–10.5)

## 2017-12-21 LAB — BASIC METABOLIC PANEL
ANION GAP: 11 (ref 5–15)
Anion gap: 10 (ref 5–15)
BUN: 47 mg/dL — ABNORMAL HIGH (ref 8–23)
BUN: 50 mg/dL — ABNORMAL HIGH (ref 8–23)
CHLORIDE: 101 mmol/L (ref 98–111)
CHLORIDE: 102 mmol/L (ref 98–111)
CO2: 24 mmol/L (ref 22–32)
CO2: 21 mmol/L — ABNORMAL LOW (ref 22–32)
Calcium: 9.4 mg/dL (ref 8.9–10.3)
Calcium: 8.9 mg/dL (ref 8.9–10.3)
Creatinine, Ser: 2.13 mg/dL — ABNORMAL HIGH (ref 0.61–1.24)
Creatinine, Ser: 2.28 mg/dL — ABNORMAL HIGH (ref 0.61–1.24)
GFR calc non Af Amer: 31 mL/min — ABNORMAL LOW (ref >60)
GFR calc non Af Amer: 28 mL/min — ABNORMAL LOW (ref >60)
GFR, EST AFRICAN AMERICAN: 36 mL/min — AB (ref >60)
GFR, EST AFRICAN AMERICAN: 33 mL/min — AB (ref >60)
Glucose, Bld: 114 mg/dL — ABNORMAL HIGH (ref 70–99)
Glucose, Bld: 167 mg/dL — ABNORMAL HIGH (ref 70–99)
POTASSIUM: 3.9 mmol/L (ref 3.5–5.1)
Potassium: 4.5 mmol/L (ref 3.5–5.1)
SODIUM: 133 mmol/L — AB (ref 135–145)
Sodium: 136 mmol/L (ref 135–145)

## 2017-12-21 LAB — TROPONIN I: TROPONIN I: 0.06 ng/mL — AB (ref <0.03)

## 2017-12-21 LAB — GLUCOSE, CAPILLARY
GLUCOSE-CAPILLARY: 79 mg/dL (ref 70–99)
Glucose-Capillary: 187 mg/dL — ABNORMAL HIGH (ref 70–99)

## 2017-12-21 LAB — URINALYSIS, ROUTINE W REFLEX MICROSCOPIC
Bilirubin Urine: NEGATIVE
Glucose, UA: NEGATIVE mg/dL
Hgb urine dipstick: NEGATIVE
Ketones, ur: NEGATIVE mg/dL
LEUKOCYTES UA: NEGATIVE
NITRITE: NEGATIVE
Protein, ur: NEGATIVE mg/dL
SPECIFIC GRAVITY, URINE: 1.019 (ref 1.005–1.030)
pH: 5 (ref 5.0–8.0)

## 2017-12-21 MED ORDER — SODIUM CHLORIDE 0.9 % IV BOLUS
250.0000 mL | Freq: Once | INTRAVENOUS | Status: AC
  Administered 2017-12-21: via INTRAVENOUS

## 2017-12-21 MED ORDER — RACEPINEPHRINE HCL 2.25 % IN NEBU: 0.5000 mL | INHALATION_SOLUTION | RESPIRATORY_TRACT | Status: DC

## 2017-12-21 MED ORDER — ALBUTEROL SULFATE (2.5 MG/3ML) 0.083% IN NEBU
INHALATION_SOLUTION | RESPIRATORY_TRACT | Status: AC
  Filled 2017-12-21: qty 3

## 2017-12-21 MED ORDER — CLONIDINE HCL 0.1 MG PO TABS
0.1000 mg | ORAL_TABLET | Freq: Once | ORAL | Status: AC
  Administered 2017-12-21: 0.1 mg via ORAL
  Filled 2017-12-21: qty 1

## 2017-12-21 MED ORDER — RACEPINEPHRINE HCL 2.25 % IN NEBU
1.0000 mL | INHALATION_SOLUTION | RESPIRATORY_TRACT | Status: AC
  Administered 2017-12-21: 1 mL via RESPIRATORY_TRACT
  Filled 2017-12-21: qty 1

## 2017-12-21 MED ORDER — DIPHENHYDRAMINE HCL 50 MG/ML IJ SOLN
INTRAMUSCULAR | Status: AC
  Filled 2017-12-21: qty 1

## 2017-12-21 NOTE — Progress Notes (Signed)
Progress Note  Patient Name: Daniel Obrien Date of Encounter: 12/21/2017  Primary Cardiologist: Ena Dawley, MD   Subjective   Wife at bedside.  Visibly shivering in bed.  Afebrile.  Blood pressure under much better control.  Awaiting lab work.  Inpatient Medications    Scheduled Meds: . aspirin EC  81 mg Oral Daily  . carvedilol  6.25 mg Oral BID WC  . heparin injection (subcutaneous)  5,000 Units Subcutaneous Q8H  . hydrALAZINE  25 mg Oral TID  . isosorbide mononitrate  30 mg Oral Daily  . spironolactone  25 mg Oral Daily   Continuous Infusions:  PRN Meds: acetaminophen **OR** acetaminophen, hydrALAZINE, Influenza vac split quadrivalent PF, ondansetron **OR** ondansetron (ZOFRAN) IV   Vital Signs    Vitals:   12/20/17 1931 12/20/17 2142 12/21/17 0451 12/21/17 0900  BP: 129/88 (!) 130/91 125/86 (!) 130/98  Pulse: 70  66 71  Resp: (!) 21  18   Temp: 97.8 F (36.6 C)  97.9 F (36.6 C)   TempSrc: Oral  Oral   SpO2: 97%  97%   Weight:   81.4 kg   Height:        Intake/Output Summary (Last 24 hours) at 12/21/2017 1023 Last data filed at 12/20/2017 1500 Gross per 24 hour  Intake 240 ml  Output -  Net 240 ml   Filed Weights   12/19/17 0604 12/20/17 0659 12/21/17 0451  Weight: 80.3 kg 80.9 kg 81.4 kg    Telemetry    Normal sinus rhythm- Personally Reviewed  ECG    Normal sinus rhythm- Personally Reviewed  Physical Exam   GEN:  Shivering Neck: No JVD Cardiac: RRR, no murmurs, rubs, or gallops.  Respiratory: Clear to auscultation bilaterally. GI: Soft, nontender, non-distended mildly protuberant MS: No edema; No deformity. Neuro:  Nonfocal  Psych: Normal affect   Labs    Chemistry Recent Labs  Lab 12/17/17 1610 12/18/17 0625 12/19/17 0435  NA 141 141 139  K 3.7 3.5 3.6  CL 105 102 100  CO2 29 27 25   GLUCOSE 150* 112* 111*  BUN 24* 21 28*  CREATININE 1.71* 1.47* 2.13*  CALCIUM 8.7* 9.0 9.7  GFRNONAA 40* 48* 31*  GFRAA 47* 56* 36*    ANIONGAP 7 12 14      Hematology Recent Labs  Lab 12/17/17 1610 12/18/17 0625  WBC 7.9 10.0  RBC 5.12 5.48  HGB 14.0 15.1  HCT 42.8 45.3  MCV 83.6 82.7  MCH 27.3 27.6  MCHC 32.7 33.3  RDW 14.0 14.0  PLT 215 201    Cardiac Enzymes Recent Labs  Lab 12/17/17 2335 12/18/17 0625  TROPONINI 0.08* 0.06*    Recent Labs  Lab 12/17/17 1617 12/17/17 2012  TROPIPOC 0.07 0.08     BNP Recent Labs  Lab 12/17/17 1610  BNP 1,450.7*     DDimer No results for input(s): DDIMER in the last 168 hours.   Radiology    Dg Abd Portable 1v  Result Date: 12/19/2017 CLINICAL DATA:  Hydronephrosis.  Penile pain with urination. EXAM: PORTABLE ABDOMEN - 1 VIEW COMPARISON:  None. FINDINGS: The bowel gas pattern is normal. Degenerative changes are evident at L4-5. Axial skeleton is otherwise unremarkable. IMPRESSION: 1. Normal bowel gas pattern. 2. Degenerative changes within the lumbar spine. Electronically Signed   By: San Morelle M.D.   On: 12/19/2017 15:29    Cardiac Studies   TTE: 12/18/2017 Left ventricle: LVEF is approximately 30% with diffuse hypokinesis. The cavity size was  severely dilated. Wall thickness was normal. - Aortic valve: There was mild regurgitation. - Mitral valve: There was mild to moderate regurgitation. - Left atrium: The atrium was severely dilated. - Right ventricle: The cavity size was mildly dilated. Systolic function was moderately reduced. - Right atrium: The atrium was mildly dilated  Patient Profile     67 y.o. male with acute systolic diastolic heart failure unknown etiology possibly hypertensive with pleuritic chest pain, orthopnea, acute kidney injury  Assessment & Plan    Acute systolic and diastolic heart failure - Creatinine previously elevated 2.1 - Urology consultation, Dr. Bess Harvest, mild hydronephrosis left possibly from contrast mediated renal injury. -EF approximately 30% no evidence of dissection on CT.   -Lasix currently  on hold as his creatinine was elevated.  Does not appear to have any significant evidence of tremendous volume overload at this point.  I am more concerned about her shivering.  Elevated troponin - Low level elevation likely demand ischemia in the setting of heart failure.  Acute kidney injury -Retesting basic metabolic profile  Shivering in bed - I will make sure that the hospitalist service is following the patient.  Looks like they were being taken care of by Dr. Crisoforo Oxford.  I would like for them to be on the hospitalist team.  Discussed this with Dr. Sarajane Jews.   Okay to continue with possible ischemic work-up as outpatient.       For questions or updates, please contact [place] Please consult [email or web address] for contact info under        Signed, Candee Furbish, MD  12/21/2017, 10:23 AM

## 2017-12-21 NOTE — Significant Event (Addendum)
Rapid Response Event Note  Overview: Respiratory - Stridor / Other - Rigors  Initial Focused Assessment: Called by RNs about having have another acute episode of rigors but this time coupled with acute onset of stridor. RNs informed me that patient was given no new medications but was given Heparin SQ and Hydralazine PO and that this happened acutely after those were administered.   When I arrived, patient was experiencing severe rigors and was in respiratory distress, oxygen saturations were 86% on RA. Patient was placed on NRB, I called RT and asked that patient receive Racemic EPI. + stridor, + noisy upper airway, lung sounds were present bilaterally but diminished, increased work of breathing, patient's did endorse CP, upper back pain, and pain with inspiration (son asked him). After the racemic epi was given, patient's respiratory status improved, rigors were present but not as intense. Rectal Temp 103.7, HR in the 90s, SBP now in 140s (was in the 180s), saturations improved to greater than >96% on 4L [place]. + JVD, very warm to touch, good capillary refill. I did ask the patient's son if the patient was confused, per son, patient is at this baseline and not altered nor confused.  First Cardiology MD on call was paged, because they are listed as the attending provider at 2115. On call MD came, discussed patient, it was determined that [facility] service was going to take over care for the patient (per notes from today). [facility] NP was paged at 2140. We discussed the patient as well. Orders received for labs/CXR.  Interventions: - STAT EKG - No acute changes - STAT CXR - Resolved CHF - STAT CBG - 79, encourage oral intake. - APAP 650 x 1 and cooling measures for temperature. - STAT LABS - BMP and CBC ordered.  Plan of Care: - Monitor VS - recheck temperature and VS in one hour. - Follow Up Labs.  Event Summary:    at     Call Time 2058 Arrival Time 2100 End Time 2215  Addeline Calarco R

## 2017-12-21 NOTE — Progress Notes (Signed)
PROGRESS NOTE  Daniel Obrien LEX:[id number] DOB: 1951-02-17 DOA: 12/17/2017 PCP: Patient, No Pcp Per  HPI/Recap of past 104 hours:  67 year old non-English-speaking [place] male with past medical history of hypertension not currently on any blood pressure medicine who was admitted with hypertensive emergency and has started on Cardizem drip with troponin elevation and a BNP of 1450.  Dr. Marlou Porch was consulted who had seen patient in consultation ejection fraction shows percentage of 30 to 35% his creatinine is slightly elevated and he seems to have developed acute renal failure due to IV contrast that was done for a PET scan which was negative   Subjective:  Patient was noted to be shivering by the cardiologist.  But he denied any fever or chills on my direct questioning.  He did complain of "continuing burning with urination.  Patient has been seen by urology on August December 19, 2017 feel she he has acute on chronic renal insufficiency mild left hydronephrosis and microscopic hematuria  Assessment/Plan: Principal Problem:   Hypertensive emergency Active Problems:   Acute CHF (congestive heart failure) (HCC)   Chest pain   ARF (acute renal failure) (HCC)   Hydroureteronephrosis, left, mild to moderate   Elevated troponin   Cardiomyopathy ([place]), new, EF 30-35%   Penile pain   Hyperthyroidism   Demand ischemia ([place])  1.  Dysuria I will obtain a UA.  Patient also already has hydronephrosis urology is on board 2.  Hypertension improved control with clonidine and hydralazine as well as carvedilol.  Cardiology is following 3.  Hydronephrosis urology is following 5.  Acute renal failure most likely from IV contrast we will continue hydration and monitoring 6.  Cardiomyopathy with ejection fraction of 30% continue current management with cardiology  Code Status: Full  Family Communication: Son and wife at bedside  Disposition Plan: Home   Consultants:  Urology Cardiology    Procedures:  PET scan  Antimicrobials:  None  DVT prophylaxis: Heparin subcu   Objective: Vitals:   12/21/17 2203 12/21/17 2257  BP:    Pulse:    Resp:    Temp: (!) 103.7 F (39.8 C) (!) 104.3 F (40.2 C)  SpO2:      Intake/Output Summary (Last 24 hours) at 12/21/2017 2346 Last data filed at 12/21/2017 1900 Gross per 24 hour  Intake 720 ml  Output -  Net 720 ml   Filed Weights   12/19/17 0604 12/20/17 0659 12/21/17 0451  Weight: 80.3 kg 80.9 kg 81.4 kg   Body mass index is 28.96 kg/m.  Exam:   General: Alert and oriented x3 no distress  Cardiovascular: Heart rate regular rate and rhythm  Respiratory: Aeration unlabored clear to auscultation with no wheezing or rhonchi  Abdomen: Full soft nontender no hepatosplenomegaly normoactive bowel sounds  Musculoskeletal: All 4 extremities no deformity no edema  Skin: Is clear and dry  Psychiatry: Appropriate behavior   Data Reviewed: CBC: Recent Labs  Lab 12/17/17 1610 12/18/17 0625 12/21/17 2248  WBC 7.9 10.0 8.1  NEUTROABS  --   --  7.7  HGB 14.0 15.1 14.3  HCT 42.8 45.3 44.6  MCV 83.6 82.7 84.3  PLT 215 201 449   Basic Metabolic Panel: Recent Labs  Lab 12/17/17 1610 12/18/17 0625 12/19/17 0435 12/21/17 1037  NA 141 141 139 136  K 3.7 3.5 3.6 4.5  CL 105 102 100 101  CO2 29 27 25 24   GLUCOSE 150* 112* 111* 114*  BUN 24* 21 28* 47*  CREATININE 1.71* 1.47*  2.13* 2.13*  CALCIUM 8.7* 9.0 9.7 9.4   GFR: Estimated Creatinine Clearance: 34.6 mL/min (A) (by C-G formula based on SCr of 2.13 mg/dL (H)). Liver Function Tests: No results for input(s): AST, ALT, ALKPHOS, BILITOT, PROT, ALBUMIN in the last 168 hours. No results for input(s): LIPASE, AMYLASE in the last 168 hours. No results for input(s): AMMONIA in the last 168 hours. Coagulation Profile: No results for input(s): INR, PROTIME in the last 168 hours. Cardiac Enzymes: Recent Labs  Lab 12/17/17 2335 12/18/17 0625  TROPONINI  0.08* 0.06*   BNP (last 3 results) No results for input(s): PROBNP in the last 8760 hours. HbA1C: No results for input(s): HGBA1C in the last 72 hours. CBG: Recent Labs  Lab 12/18/17 1534 12/21/17 1329 12/21/17 2151  GLUCAP 126* 187* 79   Lipid Profile: No results for input(s): CHOL, HDL, LDLCALC, TRIG, CHOLHDL, LDLDIRECT in the last 72 hours. Thyroid Function Tests: No results for input(s): TSH, T4TOTAL, FREET4, T3FREE, THYROIDAB in the last 72 hours. Anemia Panel: No results for input(s): VITAMINB12, FOLATE, FERRITIN, TIBC, IRON, RETICCTPCT in the last 72 hours. Urine analysis:    Component Value Date/Time   COLORURINE YELLOW 12/21/2017 [place] 12/21/2017 1757   LABSPEC 1.019 12/21/2017 1757   PHURINE 5.0 12/21/2017 1757   GLUCOSEU NEGATIVE 12/21/2017 1757   HGBUR NEGATIVE 12/21/2017 1757   BILIRUBINUR NEGATIVE 12/21/2017 1757   KETONESUR NEGATIVE 12/21/2017 1757   PROTEINUR NEGATIVE 12/21/2017 1757   NITRITE NEGATIVE 12/21/2017 1757   LEUKOCYTESUR NEGATIVE 12/21/2017 1757   Sepsis Labs: @LABRCNTIP (procalcitonin:4,lacticidven:4)  ) Recent Results (from the past 240 hour(s))  Urine culture     Status: Abnormal   Collection Time: 12/17/17  9:30 PM  Result Value Ref Range Status   Specimen Description   Final    URINE, CLEAN CATCH Performed at [facility], [place] [address] [place], [place] [address]    Special Requests   Final    NONE Performed at [facility], [place] [address] [place], [place] [address]    Culture (A)  Final    <10,000 COLONIES/mL INSIGNIFICANT GROWTH Performed at [place] [address] [place], [place] [address]    Report Status 12/19/2017 FINAL  Final  MRSA PCR Screening     Status: None   Collection Time: 12/19/17  1:15 AM  Result Value Ref Range Status   MRSA by PCR NEGATIVE NEGATIVE Final    Comment:        The GeneXpert MRSA Assay (FDA approved for NASAL  specimens only), is one component of a comprehensive MRSA colonization surveillance program. It is not intended to diagnose MRSA infection nor to guide or monitor treatment for MRSA infections. Performed at [facility], [place] [address] [place], [place] [address]       Studies: Dg Chest Port 1 View  Result Date: 12/21/2017 CLINICAL DATA:  Respiratory distress.  Stridor.  Fever. EXAM: PORTABLE CHEST 1 VIEW COMPARISON:  Radiographs and CT 12/17/2017 FINDINGS: Lower lung volumes from prior exam. Cardiomegaly appears similar allowing for differences in technique. Improved pulmonary edema. No focal airspace disease. No pleural effusion or pneumothorax. Unchanged osseous structures. IMPRESSION: 1. Resolved CHF.  Mild cardiomegaly persists. 2. No focal airspace disease. Electronically Signed   By: Keith Rake M.D.   On: 12/21/2017 22:08    Scheduled Meds: . albuterol      . aspirin EC  81 mg Oral Daily  . carvedilol  6.25  mg Oral BID WC  . diphenhydrAMINE      . heparin injection (subcutaneous)  5,000 Units Subcutaneous Q8H  . hydrALAZINE  25 mg Oral TID  . isosorbide mononitrate  30 mg Oral Daily  . spironolactone  25 mg Oral Daily    Continuous Infusions: . sodium chloride 245.9 mL/hr at 12/21/17 2340     LOS: 3 days     Cristal Deer, MD [facility]  To reach me or the doctor on call, go to: [email or web address] Password [email or web address]  12/21/2017, 11:46 PM

## 2017-12-21 NOTE — Progress Notes (Signed)
Patient demonstrated rigors, wheezing and appeared to be air hungry family stated that this has occurred before post Heparin injection. Patient hypertensive and c/o chest and pain between shoulder blades CBG 79 rectal temp 103.7 650mg  of Tylenol and Clonidine 0.1 mg given. Rapid Response call to assist. Temp recheck 104.3 Charge RN Sharee Pimple notified [facility]. Arthor Captain LPN

## 2017-12-22 LAB — URINALYSIS, ROUTINE W REFLEX MICROSCOPIC
Bilirubin Urine: NEGATIVE
GLUCOSE, UA: NEGATIVE mg/dL
Hgb urine dipstick: NEGATIVE
KETONES UR: NEGATIVE mg/dL
Leukocytes, UA: NEGATIVE
Nitrite: NEGATIVE
PROTEIN: NEGATIVE mg/dL
Specific Gravity, Urine: 1.02 (ref 1.005–1.030)
pH: 5 (ref 5.0–8.0)

## 2017-12-22 LAB — CBC
HEMATOCRIT: 43.7 % (ref 39.0–52.0)
HEMOGLOBIN: 14 g/dL (ref 13.0–17.0)
MCH: 27.2 pg (ref 26.0–34.0)
MCHC: 32 g/dL (ref 30.0–36.0)
MCV: 85 fL (ref 78.0–100.0)
Platelets: 164 10*3/uL (ref 150–400)
RBC: 5.14 MIL/uL (ref 4.22–5.81)
RDW: 14 % (ref 11.5–15.5)
WBC: 20 10*3/uL — ABNORMAL HIGH (ref 4.0–10.5)

## 2017-12-22 LAB — BASIC METABOLIC PANEL
ANION GAP: 10 (ref 5–15)
BUN: 53 mg/dL — ABNORMAL HIGH (ref 8–23)
CALCIUM: 8.7 mg/dL — AB (ref 8.9–10.3)
CO2: 23 mmol/L (ref 22–32)
Chloride: 100 mmol/L (ref 98–111)
Creatinine, Ser: 2.64 mg/dL — ABNORMAL HIGH (ref 0.61–1.24)
GFR, EST AFRICAN AMERICAN: 28 mL/min — AB (ref >60)
GFR, EST NON AFRICAN AMERICAN: 24 mL/min — AB (ref >60)
Glucose, Bld: 139 mg/dL — ABNORMAL HIGH (ref 70–99)
POTASSIUM: 3.7 mmol/L (ref 3.5–5.1)
SODIUM: 133 mmol/L — AB (ref 135–145)

## 2017-12-22 MED ORDER — ACETAMINOPHEN 10 MG/ML IV SOLN
1000.0000 mg | Freq: Once | INTRAVENOUS | Status: AC
  Administered 2017-12-22: 1000 mg via INTRAVENOUS
  Filled 2017-12-22 (×2): qty 100

## 2017-12-22 NOTE — Plan of Care (Signed)
  Problem: Clinical Measurements: Goal: Respiratory complications will improve Outcome: Progressing   Problem: Clinical Measurements: Goal: Cardiovascular complication will be avoided Outcome: Progressing   Problem: Safety: Goal: Ability to remain free from injury will improve Outcome: Progressing   

## 2017-12-22 NOTE — Progress Notes (Signed)
[facility] paged informed critical troponin 0.06. Will continue to monitor. Arthor Captain LPN

## 2017-12-22 NOTE — Care Management Note (Addendum)
Case Management Note  Patient Details  Name: Daniel Obrien MRN: [id number] Date of Birth: 1950/10/06  Subjective/Objective: Pt presented for Hypertensive urgency. PTA from home with wife. Patients son in the room and he speaks [place].         Action/Plan: CM did speak with son in regards to patient and son states patient will not need HH RN at this time. CM did provide patient with [facility] Number to call for PCP. No further needs from CM at this time.   Expected Discharge Date:          Expected Discharge Plan:  Home/Self Care  In-House Referral:  PCP / [facility]  Discharge planning Services  CM Consult  Post Acute Care Choice:  Durable Medical Equipment  Choice offered to: Patient, Adult Children  DME Arranged:  [place] DME Agency: [place] Arranged:  NA HH Agency:  NA  Status of Service:  Completed, signed off  If discussed at [place] of Stay Meetings, dates discussed:    Additional Comments: 1230 12-24-17 Jacqlyn Krauss, RN,BSN [phone] [place] Rile will be delivered to the room prior to transition home.  Bethena Roys, RN 12/22/2017, 3:05 PM

## 2017-12-22 NOTE — Progress Notes (Signed)
Progress Note  Patient Name: Daniel Obrien Date of Encounter: 12/22/2017  Primary Cardiologist: Ena Dawley, MD   Subjective   No interpreter available   Pt comfortable laying flat in bed   Denies CP     Inpatient Medications    Scheduled Meds: . albuterol      . aspirin EC  81 mg Oral Daily  . carvedilol  6.25 mg Oral BID WC  . diphenhydrAMINE      . heparin injection (subcutaneous)  5,000 Units Subcutaneous Q8H  . hydrALAZINE  25 mg Oral TID  . isosorbide mononitrate  30 mg Oral Daily  . spironolactone  25 mg Oral Daily   Continuous Infusions:  PRN Meds: acetaminophen **OR** acetaminophen, hydrALAZINE, Influenza vac split quadrivalent PF, ondansetron **OR** ondansetron (ZOFRAN) IV   Vital Signs    Vitals:   12/22/17 0037 12/22/17 0252 12/22/17 0357 12/22/17 0758  BP: 117/68  (!) 94/59 91/63  Pulse:    (!) 59  Resp: 17  (!) 24   Temp: (!) 102.5 F (39.2 C) 100.1 F (37.8 C) 97.9 F (36.6 C) (!) 97.4 F (36.3 C)  TempSrc: Rectal Rectal Axillary Oral  SpO2:    99%  Weight:   83.1 kg   Height:        Intake/Output Summary (Last 24 hours) at 12/22/2017 0809 Last data filed at 12/22/2017 0700 Gross per 24 hour  Intake 1160 ml  Output -  Net 1160 ml   Filed Weights   12/20/17 0659 12/21/17 0451 12/22/17 0357  Weight: 80.9 kg 81.4 kg 83.1 kg    Telemetry    SR   - Personally Reviewed  ECG    None - Personally Reviewed  Physical Exam  Pt comfortable laying flat in bed   GEN: No acute distress.   Neck: No JVD Cardiac: RRR, no murmurs, rubs, or gallops.  Respiratory: Clear to auscultation bilaterally. GI: Soft, nontender, non-distended  MS: No edema; No deformity. Neuro:  Nonfocal  Psych: Normal affect   Labs    Chemistry Recent Labs  Lab 12/21/17 1037 12/21/17 2248 12/22/17 0451  NA 136 133* 133*  K 4.5 3.9 3.7  CL 101 102 100  CO2 24 21* 23  GLUCOSE 114* 167* 139*  BUN 47* 50* 53*  CREATININE 2.13* 2.28* 2.64*  CALCIUM 9.4 8.9 8.7*   GFRNONAA 31* 28* 24*  GFRAA 36* 33* 28*  ANIONGAP 11 10 10      Hematology Recent Labs  Lab 12/18/17 0625 12/21/17 2248 12/22/17 0451  WBC 10.0 8.1 20.0*  RBC 5.48 5.29 5.14  HGB 15.1 14.3 14.0  HCT 45.3 44.6 43.7  MCV 82.7 84.3 85.0  MCH 27.6 27.0 27.2  MCHC 33.3 32.1 32.0  RDW 14.0 13.8 14.0  PLT 201 160 164    Cardiac Enzymes Recent Labs  Lab 12/17/17 2335 12/18/17 0625 12/21/17 2248  TROPONINI 0.08* 0.06* 0.06*    Recent Labs  Lab 12/17/17 1617 12/17/17 2012  TROPIPOC 0.07 0.08     BNP Recent Labs  Lab 12/17/17 1610  BNP 1,450.7*     DDimer No results for input(s): DDIMER in the last 168 hours.   Radiology    Dg Chest Port 1 View  Result Date: 12/21/2017 CLINICAL DATA:  Respiratory distress.  Stridor.  Fever. EXAM: PORTABLE CHEST 1 VIEW COMPARISON:  Radiographs and CT 12/17/2017 FINDINGS: Lower lung volumes from prior exam. Cardiomegaly appears similar allowing for differences in technique. Improved pulmonary edema. No focal airspace disease. No  pleural effusion or pneumothorax. Unchanged osseous structures. IMPRESSION: 1. Resolved CHF.  Mild cardiomegaly persists. 2. No focal airspace disease. Electronically Signed   By: Keith Rake M.D.   On: 12/21/2017 22:08    Cardiac Studies   TTE: 12/18/2017 Left ventricle: LVEF is approximately 30% with diffuse hypokinesis. The cavity size was severely dilated. Wall thickness was normal. - Aortic valve: There was mild regurgitation. - Mitral valve: There was mild to moderate regurgitation. - Left atrium: The atrium was severely dilated. - Right ventricle: The cavity size was mildly dilated. Systolic function was moderately reduced. - Right atrium: The atrium was mildly dilated  Patient Profile     67 y.o. male hx of acute systolic / diastolic CHF, HTN who presents with exacerbation    Assessment & Plan    1  Acute systolic / diastolic CHF Etiology not clear Pt now on medical Rx     Would  favor close medical Rx   REeval LV and RV function prior to invasive eval given renal funtion.   With bump in Cr would hold aldactone for now   Follow  Needs instruction on low salt intack as well as diet     2  Troponin elevation  Trivial   Flat   May represent demand in setting of CHF    3  HTN  BP sl low at times   Keep on same meds for now  Follow      3  Renal  Cr bumped to 2.6   On admit 1.47  Hold aldactone   Follow      For questions or updates, please contact [facility] Please consult [email or web address] for contact info under        Signed, Dorris Carnes, MD  12/22/2017, 8:09 AM

## 2017-12-22 NOTE — Progress Notes (Addendum)
Pt wife is requesting for a work note. She had been with her husband the last 4-5 days.  She will bring the work note to her employer when her husband gets better. She is the only caregiver for the pt. Kindly address and thank you.

## 2017-12-22 NOTE — Progress Notes (Signed)
PROGRESS NOTE  Ladarryl Wrage QZE:[id number] DOB: Aug 05, 1950 DOA: 12/17/2017 PCP: Patient, No Pcp Per  Brief Summary:  67 year old non-English-speaking [place] male with past medical history of hypertension not currently on any blood pressure medicine who was admitted with hypertensive emergency and has started on Cardizem drip with troponin elevation and a BNP of 1450.  Dr. Marlou Porch was consulted who had seen patient in consultation ejection fraction shows percentage of 30 to 35% his creatinine is slightly elevated and he seems to have developed acute renal failure due to IV contrast     HPI/Recap of past 24 hours:  Spiked fever, Tmax104 last night, wbc jumped to 20 this am,  cr worsening, he continues to reports dysuria, denies ab pain, no back pain, no chest pain He reports  Mild cough when lay flat He does not speak english, Son at bedside help translating  Assessment/Plan: Principal Problem:   Hypertensive emergency Active Problems:   Acute CHF (congestive heart failure) (HCC)   Chest pain   ARF (acute renal failure) (HCC)   Hydroureteronephrosis, left, mild to moderate   Elevated troponin   Cardiomyopathy ([place]), new, EF 30-35%   Penile pain   Hyperthyroidism   Demand ischemia ([place])    Fever: Had fever on 10/6, not today, he does not appear septic Report dysuria, but ua unremarkable,  Blood culture no growth, cxr no acute findings Incentive spirometer for now, consider pan ct is spike fever again  Acute on chronic systolic chf/HTN emergency (presenting symptom) Echo lvef 30% Cardiology following, meds adjustment per cardiology  AKI on CKDIII -Initial CT "Mild-to-moderate left hydroureteronephrosis without obstructing left ureteral calculus appearing more chronic than acute without surrounding perinephric edema" -Urology Dr Tresa Moore consulted on 10/4 "outpatient eletive eval with cysto / retrogrades / diagnostic ureteroscopy " -Cr 1.47 on presentation, cr worsening today  is 2.64, repeat UA unremarkable,  -He received iv contrast on initial presentation for CTA chest and abdomen -Will get renal US -renal dosing meds, repeat lab in am   Code Status: full  Family Communication: patient and son  Disposition Plan: not ready to discharge   Consultants:  Cardiology  urology  Procedures:  none  Antibiotics:  none   Objective: BP (!) 94/59 (BP Location: Right Arm)   Pulse 66   Temp 97.9 F (36.6 C) (Axillary)   Resp (!) 24   Ht 5\' 6"  (1.676 m)   Wt 83.1 kg   SpO2 96%   BMI 29.59 kg/m   Intake/Output Summary (Last 24 hours) at 12/22/2017 0738 Last data filed at 12/22/2017 0700 Gross per 24 hour  Intake 1160 ml  Output -  Net 1160 ml   Filed Weights   12/20/17 0659 12/21/17 0451 12/22/17 0357  Weight: 80.9 kg 81.4 kg 83.1 kg    Exam: Patient is examined daily including today on 12/22/2017, exams remain the same as of yesterday except that has changed    General:  NAD  Cardiovascular: RRR  Respiratory: mild crackles best heard on right lower lung, no wheezing, no rhonchi  Abdomen: Soft/ND/NT, positive BS  Musculoskeletal: No Edema  Neuro: alert, oriented   Data Reviewed: Basic Metabolic Panel: Recent Labs  Lab 12/18/17 0625 12/19/17 0435 12/21/17 1037 12/21/17 2248 12/22/17 0451  NA 141 139 136 133* 133*  K 3.5 3.6 4.5 3.9 3.7  CL 102 100 101 102 100  CO2 27 25 24  21* 23  GLUCOSE 112* 111* 114* 167* 139*  BUN 21 28* 47* 50* 53*  CREATININE  1.47* 2.13* 2.13* 2.28* 2.64*  CALCIUM 9.0 9.7 9.4 8.9 8.7*   Liver Function Tests: No results for input(s): AST, ALT, ALKPHOS, BILITOT, PROT, ALBUMIN in the last 168 hours. No results for input(s): LIPASE, AMYLASE in the last 168 hours. No results for input(s): AMMONIA in the last 168 hours. CBC: Recent Labs  Lab 12/17/17 1610 12/18/17 0625 12/21/17 2248 12/22/17 0451  WBC 7.9 10.0 8.1 20.0*  NEUTROABS  --   --  7.7  --   HGB 14.0 15.1 14.3 14.0  HCT 42.8 45.3  44.6 43.7  MCV 83.6 82.7 84.3 85.0  PLT 215 201 160 164   Cardiac Enzymes:   Recent Labs  Lab 12/17/17 2335 12/18/17 0625 12/21/17 2248  TROPONINI 0.08* 0.06* 0.06*   BNP (last 3 results) Recent Labs    12/17/17 1610  BNP 1,450.7*    ProBNP (last 3 results) No results for input(s): PROBNP in the last 8760 hours.  CBG: Recent Labs  Lab 12/18/17 1534 12/21/17 1329 12/21/17 2151  GLUCAP 126* 187* 79    Recent Results (from the past 240 hour(s))  Urine culture     Status: Abnormal   Collection Time: 12/17/17  9:30 PM  Result Value Ref Range Status   Specimen Description   Final    URINE, CLEAN CATCH Performed at [facility], [place] [address], [place], [place] [address]    Special Requests   Final    NONE Performed at [facility], [place] [address] [place], [place] [address]    Culture (A)  Final    <10,000 COLONIES/mL INSIGNIFICANT GROWTH Performed at [place] [address] [place], [place] [address]    Report Status 12/19/2017 FINAL  Final  MRSA PCR Screening     Status: None   Collection Time: 12/19/17  1:15 AM  Result Value Ref Range Status   MRSA by PCR NEGATIVE NEGATIVE Final    Comment:        The GeneXpert MRSA Assay (FDA approved for NASAL specimens only), is one component of a comprehensive MRSA colonization surveillance program. It is not intended to diagnose MRSA infection nor to guide or monitor treatment for MRSA infections. Performed at [facility], [place] [address], [place], [place] [address]   Culture, blood (routine x 2)     Status: None (Preliminary result)   Collection Time: 12/21/17 11:36 PM  Result Value Ref Range Status   Specimen Description BLOOD LEFT ARM  Final   Special Requests   Final    BOTTLES DRAWN AEROBIC AND ANAEROBIC Blood Culture results may not be optimal due to an excessive volume of blood received in culture bottles   Culture   Final    NO GROWTH < 12  HOURS Performed at [facility], [place] [address] [place],  [address]    Report Status PENDING  Incomplete  Culture, blood (routine x 2)     Status: None (Preliminary result)   Collection Time: 12/21/17 11:36 PM  Result Value Ref Range Status   Specimen Description BLOOD LEFT HAND  Final   Special Requests   Final    BOTTLES DRAWN AEROBIC AND ANAEROBIC Blood Culture adequate volume   Culture   Final    NO GROWTH < 12 HOURS Performed at [facility], [place] [address] [place],  [address]    Report Status PENDING  Incomplete     Studies: Dg Chest Bdpec Asc Show Low 1 [address]  Result Date: 12/21/2017 CLINICAL DATA:  Respiratory distress.  Stridor.  Fever. EXAM: PORTABLE CHEST 1 VIEW COMPARISON:  Radiographs and CT 12/17/2017 FINDINGS: Lower lung volumes from prior exam. Cardiomegaly appears similar allowing for differences in technique. Improved pulmonary edema. No focal airspace disease. No pleural effusion or pneumothorax. Unchanged osseous structures. IMPRESSION: 1. Resolved CHF.  Mild cardiomegaly persists. 2. No focal airspace disease. Electronically Signed   By: Keith Rake M.D.   On: 12/21/2017 22:08    Scheduled Meds: . albuterol      . aspirin EC  81 mg Oral Daily  . carvedilol  6.25 mg Oral BID WC  . diphenhydrAMINE      . heparin injection (subcutaneous)  5,000 Units Subcutaneous Q8H  . hydrALAZINE  25 mg Oral TID  . isosorbide mononitrate  30 mg Oral Daily  . spironolactone  25 mg Oral Daily    Continuous Infusions:   Time spent: 47mins I have personally reviewed and interpreted on  12/22/2017 daily labs, tele strips, imagings as discussed above under date review session and assessment and plans.  I reviewed all nursing notes, pharmacy notes, consultant notes,  vitals, pertinent old records  I have discussed plan of care as described above with RN , patient and family on 12/22/2017   Florencia Reasons MD, PhD  [facility] Pager [phone]. If 7PM-7AM,  please contact night-coverage at [email or web address], password [facility] 12/22/2017, 7:38 AM  LOS: 4 days

## 2017-12-22 NOTE — Progress Notes (Signed)
[facility] paged rectal temp 102.5.Arthor Captain LPN

## 2017-12-23 ENCOUNTER — Inpatient Hospital Stay (HOSPITAL_COMMUNITY): Payer: Medicare Other

## 2017-12-23 LAB — CBC WITH DIFFERENTIAL/PLATELET
Abs Immature Granulocytes: 0 10*3/uL (ref 0.00–0.07)
Basophils Absolute: 0.1 10*3/uL (ref 0.0–0.1)
Basophils Relative: 0 %
EOS ABS: 0.4 10*3/uL (ref 0.0–0.5)
EOS PCT: 3 %
HCT: 45.1 % (ref 39.0–52.0)
Hemoglobin: 14.5 g/dL (ref 13.0–17.0)
Immature Granulocytes: 0 %
Lymphocytes Relative: 11 %
Lymphs Abs: 1.4 10*3/uL (ref 0.7–4.0)
MCH: 27 pg (ref 26.0–34.0)
MCHC: 32.2 g/dL (ref 30.0–36.0)
MCV: 84 fL (ref 80.0–100.0)
MONO ABS: 1 10*3/uL (ref 0.1–1.0)
Monocytes Relative: 7 %
Neutro Abs: 10.1 10*3/uL — ABNORMAL HIGH (ref 1.7–7.7)
Neutrophils Relative %: 79 %
Platelets: 155 10*3/uL (ref 150–400)
RBC: 5.37 MIL/uL (ref 4.22–5.81)
RDW: 13.9 % (ref 11.5–15.5)
WBC: 13 10*3/uL — AB (ref 4.0–10.5)

## 2017-12-23 LAB — COMPREHENSIVE METABOLIC PANEL
ALT: 52 U/L — ABNORMAL HIGH (ref 0–44)
AST: 52 U/L — ABNORMAL HIGH (ref 15–41)
Albumin: 3.4 g/dL — ABNORMAL LOW (ref 3.5–5.0)
Alkaline Phosphatase: 79 U/L (ref 38–126)
Anion gap: 8 (ref 5–15)
BILIRUBIN TOTAL: 0.9 mg/dL (ref 0.3–1.2)
BUN: 48 mg/dL — ABNORMAL HIGH (ref 8–23)
CALCIUM: 8.7 mg/dL — AB (ref 8.9–10.3)
CO2: 24 mmol/L (ref 22–32)
Chloride: 105 mmol/L (ref 98–111)
Creatinine, Ser: 2.22 mg/dL — ABNORMAL HIGH (ref 0.61–1.24)
GFR, EST AFRICAN AMERICAN: 34 mL/min — AB (ref >60)
GFR, EST NON AFRICAN AMERICAN: 29 mL/min — AB (ref >60)
Glucose, Bld: 108 mg/dL — ABNORMAL HIGH (ref 70–99)
Potassium: 3.9 mmol/L (ref 3.5–5.1)
Sodium: 137 mmol/L (ref 135–145)
TOTAL PROTEIN: 6.4 g/dL — AB (ref 6.5–8.1)

## 2017-12-23 LAB — T4, FREE: FREE T4: 1.11 ng/dL (ref 0.82–1.77)

## 2017-12-23 LAB — PROCALCITONIN: Procalcitonin: 110.81 ng/mL

## 2017-12-23 LAB — SEDIMENTATION RATE: SED RATE: 10 mm/h (ref 0–16)

## 2017-12-23 LAB — C-REACTIVE PROTEIN: CRP: 9.8 mg/dL — AB (ref <1.0)

## 2017-12-23 LAB — LACTIC ACID, PLASMA: Lactic Acid, Venous: 1 mmol/L (ref 0.5–1.9)

## 2017-12-23 MED ORDER — TAMSULOSIN HCL 0.4 MG PO CAPS
0.4000 mg | ORAL_CAPSULE | Freq: Every day | ORAL | Status: DC
  Administered 2017-12-23 – 2017-12-24 (×2): 0.4 mg via ORAL
  Filled 2017-12-23 (×2): qty 1

## 2017-12-23 NOTE — Progress Notes (Addendum)
Patient have a post void bladder scan of ~138.  Saddie Benders RN

## 2017-12-23 NOTE — Care Management Important Message (Signed)
Important Message  Patient Details  Name: Daniel Obrien MRN: [id number] Date of Birth: 1950/03/29   Medicare Important Message Given:  Yes    [name] P Taylorann Tkach 12/23/2017, 3:24 PM

## 2017-12-23 NOTE — Progress Notes (Signed)
Progress Note  Patient Name: Daniel Obrien Date of Encounter: 12/23/2017  Primary Cardiologist: Ena Dawley, MD   Subjective   With interpreter   No CP   No SOB      Inpatient Medications    Scheduled Meds: . aspirin EC  81 mg Oral Daily  . carvedilol  6.25 mg Oral BID WC  . heparin injection (subcutaneous)  5,000 Units Subcutaneous Q8H  . hydrALAZINE  25 mg Oral TID  . isosorbide mononitrate  30 mg Oral Daily   Continuous Infusions:  PRN Meds: acetaminophen **OR** acetaminophen, hydrALAZINE, Influenza vac split quadrivalent PF, ondansetron **OR** ondansetron (ZOFRAN) IV   Vital Signs    Vitals:   12/22/17 2243 12/22/17 2300 12/23/17 0110 12/23/17 0554  BP: (!) 97/54  124/74 124/80  Pulse: 65 75 69 66  Resp:   18   Temp:   97.7 F (36.5 C) (!) 97.5 F (36.4 C)  TempSrc:   Oral Oral  SpO2: 96% 95% 95% 95%  Weight:      Height:        Intake/Output Summary (Last 24 hours) at 12/23/2017 0816 Last data filed at 12/23/2017 7741 Gross per 24 hour  Intake 960 ml  Output 390 ml  Net 570 ml   Filed Weights   12/20/17 0659 12/21/17 0451 12/22/17 0357  Weight: 80.9 kg 81.4 kg 83.1 kg    Telemetry    SR     - Personally Reviewed  ECG    None - Personally Reviewed  Physical Exam  Pt comfortable laying flat in bed   GEN: No acute distress.   Neck: JVP is normal   Cardiac: RRR, no murmurs, rubs, or gallops.  Respiratory: Clear to auscultation bilaterally. GI: Soft, nontender, non-distended  MS: No edema; No deformity. Neuro:  Nonfocal  Psych: Normal affect   Labs    Chemistry Recent Labs  Lab 12/21/17 2248 12/22/17 0451 12/23/17 0612  NA 133* 133* 137  K 3.9 3.7 3.9  CL 102 100 105  CO2 21* 23 24  GLUCOSE 167* 139* 108*  BUN 50* 53* 48*  CREATININE 2.28* 2.64* 2.22*  CALCIUM 8.9 8.7* 8.7*  PROT  --   --  6.4*  ALBUMIN  --   --  3.4*  AST  --   --  52*  ALT  --   --  52*  ALKPHOS  --   --  79  BILITOT  --   --  0.9  GFRNONAA 28* 24* 29*    GFRAA 33* 28* 34*  ANIONGAP 10 10 8      Hematology Recent Labs  Lab 12/21/17 2248 12/22/17 0451 12/23/17 0612  WBC 8.1 20.0* 13.0*  RBC 5.29 5.14 5.37  HGB 14.3 14.0 14.5  HCT 44.6 43.7 45.1  MCV 84.3 85.0 84.0  MCH 27.0 27.2 27.0  MCHC 32.1 32.0 32.2  RDW 13.8 14.0 13.9  PLT 160 164 155    Cardiac Enzymes Recent Labs  Lab 12/17/17 2335 12/18/17 0625 12/21/17 2248  TROPONINI 0.08* 0.06* 0.06*    Recent Labs  Lab 12/17/17 1617 12/17/17 2012  TROPIPOC 0.07 0.08     BNP Recent Labs  Lab 12/17/17 1610  BNP 1,450.7*     DDimer No results for input(s): DDIMER in the last 168 hours.   Radiology    US Renal  Result Date: 12/23/2017 CLINICAL DATA:  Elevated creatinine EXAM: RENAL / URINARY TRACT ULTRASOUND COMPLETE COMPARISON:  None. FINDINGS: Right Kidney: Length: 9.9 cm.  Mild diffuse parenchymal atrophy. No focal mass lesion. No hydronephrosis. Left Kidney: Length: 9.4 cm. Mild diffuse parenchymal atrophy. No focal mass lesion. No hydronephrosis. Bladder: Appears normal for degree of bladder distention. IMPRESSION: Mild renal parenchymal atrophy bilaterally.  No hydronephrosis. Electronically Signed   By: Lucienne Capers M.D.   On: 12/23/2017 03:19   Dg Chest Port 1 View  Result Date: 12/21/2017 CLINICAL DATA:  Respiratory distress.  Stridor.  Fever. EXAM: PORTABLE CHEST 1 VIEW COMPARISON:  Radiographs and CT 12/17/2017 FINDINGS: Lower lung volumes from prior exam. Cardiomegaly appears similar allowing for differences in technique. Improved pulmonary edema. No focal airspace disease. No pleural effusion or pneumothorax. Unchanged osseous structures. IMPRESSION: 1. Resolved CHF.  Mild cardiomegaly persists. 2. No focal airspace disease. Electronically Signed   By: Keith Rake M.D.   On: 12/21/2017 22:08    Cardiac Studies   TTE: 12/18/2017 Left ventricle: LVEF is approximately 30% with diffuse hypokinesis. The cavity size was severely dilated. Wall  thickness was normal. - Aortic valve: There was mild regurgitation. - Mitral valve: There was mild to moderate regurgitation. - Left atrium: The atrium was severely dilated. - Right ventricle: The cavity size was mildly dilated. Systolic function was moderately reduced. - Right atrium: The atrium was mildly dilated  Patient Profile     67 y.o. male hx of acute systolic / diastolic CHF, HTN who presents with exacerbation    Assessment & Plan    1  Acute systolic / diastolic CHF Etiology not clear Pt now on medical Rx     Plan for medical Rx with f/u echo in a few months  Volume status is OK     2  Troponin elevation  Trivial   Flat   May represent demand in setting of CHF    3  HTN  BP is OK      3  Renal  Cr improved from yesterday  2.22  (down from 2.64)  Follow     For questions or updates, please contact [facility] Please consult [email or web address] for contact info under        Signed, Dorris Carnes, MD  12/23/2017, 8:16 AM

## 2017-12-23 NOTE — Progress Notes (Signed)
PROGRESS NOTE  Daniel Obrien FGH:[id number] DOB: Jul 25, 1950 DOA: 12/17/2017 PCP: Patient, No Pcp Per  Brief Summary:  67 year old non-English-speaking [place] male with past medical history of hypertension not currently on any blood pressure medicine who was admitted with hypertensive emergency and has started on Cardizem drip with troponin elevation and a BNP of 1450.  Dr. Marlou Porch was consulted who had seen patient in consultation ejection fraction shows percentage of 30 to 35% his creatinine is slightly elevated and he seems to have developed acute renal failure due to IV contrast    HPI/Recap of past 24 hours:  No fever last 24hrs,  cr improved,  he denies dysuria today, denies ab pain, no back pain, no chest pain No cough, he ambulated in the room with steady gait  He does not speak english, Son at bedside help translating  Assessment/Plan: Principal Problem:   Hypertensive emergency Active Problems:   Acute CHF (congestive heart failure) (HCC)   Chest pain   ARF (acute renal failure) (HCC)   Hydroureteronephrosis, left, mild to moderate   Elevated troponin   Cardiomyopathy ([place]), new, EF 30-35%   Penile pain   Hyperthyroidism   Demand ischemia (HCC)    Fever: One time fever (104) on 10/6, non since, he does not appear septic, but did have leukocytosis Reported dysuria on 10/6, seems has resolved, ua unremarkable,  Blood culture no growth, cxr no acute findings Incentive spirometer for now, consider pan ct is spike fever again  Acute on chronic systolic chf/HTN emergency (presenting symptom) Echo lvef 30% Cardiology following, meds adjustment per cardiology  AKI on CKDIII -Initial CT "Mild-to-moderate left hydroureteronephrosis without obstructing left ureteral calculus appearing more chronic than acute without surrounding perinephric edema" -Urology Dr Tresa Moore consulted on 10/4 "outpatient eletive eval with cysto / retrogrades / diagnostic ureteroscopy " -Cr 1.47 on  presentation, cr worsening today is 2.64, repeat UA unremarkable,  -He received iv contrast on initial presentation for CTA chest and abdomen -renal US on 10/7 no hydronephrosis -will monitor post void residual, start flomax -renal dosing meds, repeat lab in am   lft elevation from liver congestion? CT on presentation hepatobiliary unremarkable Check hepatitis  Code Status: full  Family Communication: patient and son  Disposition Plan: home tomorrow if fever free, cr continue to improve and cardiology clearance   Consultants:  Cardiology  urology  Procedures:  none  Antibiotics:  none   Objective: BP 124/80 (BP Location: Left Arm)   Pulse 66   Temp (!) 97.5 F (36.4 C) (Oral)   Resp 18   Ht 5\' 6"  (1.676 m)   Wt 83.1 kg   SpO2 95%   BMI 29.59 kg/m   Intake/Output Summary (Last 24 hours) at 12/23/2017 0756 Last data filed at 12/23/2017 6789 Gross per 24 hour  Intake 960 ml  Output 390 ml  Net 570 ml   Filed Weights   12/20/17 0659 12/21/17 0451 12/22/17 0357  Weight: 80.9 kg 81.4 kg 83.1 kg    Exam: Patient is examined daily including today on 12/23/2017, exams remain the same as of yesterday except that has changed    General:  NAD  Cardiovascular: RRR  Respiratory: mild crackles best heard on right lower lung has resolved, no wheezing, no rhonchi  Abdomen: Soft/ND/NT, positive BS  Musculoskeletal: No Edema  Neuro: alert, oriented   Data Reviewed: Basic Metabolic Panel: Recent Labs  Lab 12/19/17 0435 12/21/17 1037 12/21/17 2248 12/22/17 0451 12/23/17 0612  NA 139 136 133* 133* 137  K 3.6 4.5 3.9 3.7 3.9  CL 100 101 102 100 105  CO2 25 24 21* 23 24  GLUCOSE 111* 114* 167* 139* 108*  BUN 28* 47* 50* 53* 48*  CREATININE 2.13* 2.13* 2.28* 2.64* 2.22*  CALCIUM 9.7 9.4 8.9 8.7* 8.7*   Liver Function Tests: Recent Labs  Lab 12/23/17 0612  AST 52*  ALT 52*  ALKPHOS 79  BILITOT 0.9  PROT 6.4*  ALBUMIN 3.4*   No results for  input(s): LIPASE, AMYLASE in the last 168 hours. No results for input(s): AMMONIA in the last 168 hours. CBC: Recent Labs  Lab 12/17/17 1610 12/18/17 0625 12/21/17 2248 12/22/17 0451 12/23/17 0612  WBC 7.9 10.0 8.1 20.0* 13.0*  NEUTROABS  --   --  7.7  --  10.1*  HGB 14.0 15.1 14.3 14.0 14.5  HCT 42.8 45.3 44.6 43.7 45.1  MCV 83.6 82.7 84.3 85.0 84.0  PLT 215 201 160 164 155   Cardiac Enzymes:   Recent Labs  Lab 12/17/17 2335 12/18/17 0625 12/21/17 2248  TROPONINI 0.08* 0.06* 0.06*   BNP (last 3 results) Recent Labs    12/17/17 1610  BNP 1,450.7*    ProBNP (last 3 results) No results for input(s): PROBNP in the last 8760 hours.  CBG: Recent Labs  Lab 12/18/17 1534 12/21/17 1329 12/21/17 2151  GLUCAP 126* 187* 79    Recent Results (from the past 240 hour(s))  Urine culture     Status: Abnormal   Collection Time: 12/17/17  9:30 PM  Result Value Ref Range Status   Specimen Description   Final    URINE, CLEAN CATCH Performed at [facility], [place] [address], [place], [place] [address]    Special Requests   Final    NONE Performed at [facility], [place] [address] [place], [place] [address]    Culture (A)  Final    <10,000 COLONIES/mL INSIGNIFICANT GROWTH Performed at [place] [address] [place], [place] [address]    Report Status 12/19/2017 FINAL  Final  MRSA PCR Screening     Status: None   Collection Time: 12/19/17  1:15 AM  Result Value Ref Range Status   MRSA by PCR NEGATIVE NEGATIVE Final    Comment:        The GeneXpert MRSA Assay (FDA approved for NASAL specimens only), is one component of a comprehensive MRSA colonization surveillance program. It is not intended to diagnose MRSA infection nor to guide or monitor treatment for MRSA infections. Performed at [facility], [place] [address] [place], [place] [address]   Culture, blood (routine x 2)     Status: None (Preliminary result)     Collection Time: 12/21/17 11:36 PM  Result Value Ref Range Status   Specimen Description BLOOD LEFT ARM  Final   Special Requests   Final    BOTTLES DRAWN AEROBIC AND ANAEROBIC Blood Culture results may not be optimal due to an excessive volume of blood received in culture bottles   Culture   Final    NO GROWTH < 12 HOURS Performed at [facility], [place] [address], [place], [place] [address]    Report Status PENDING  Incomplete  Culture, blood (routine x 2)     Status: None (Preliminary result)   Collection Time: 12/21/17 11:36 PM  Result Value Ref Range Status   Specimen Description BLOOD LEFT HAND  Final   Special Requests   Final  BOTTLES DRAWN AEROBIC AND ANAEROBIC Blood Culture adequate volume   Culture   Final    NO GROWTH < 12 HOURS Performed at [facility], [place] [address], [place], [place] [address]    Report Status PENDING  Incomplete     Studies: US Renal  Result Date: 12/23/2017 CLINICAL DATA:  Elevated creatinine EXAM: RENAL / URINARY TRACT ULTRASOUND COMPLETE COMPARISON:  None. FINDINGS: Right Kidney: Length: 9.9 cm. Mild diffuse parenchymal atrophy. No focal mass lesion. No hydronephrosis. Left Kidney: Length: 9.4 cm. Mild diffuse parenchymal atrophy. No focal mass lesion. No hydronephrosis. Bladder: Appears normal for degree of bladder distention. IMPRESSION: Mild renal parenchymal atrophy bilaterally.  No hydronephrosis. Electronically Signed   By: Lucienne Capers M.D.   On: 12/23/2017 03:19    Scheduled Meds: . aspirin EC  81 mg Oral Daily  . carvedilol  6.25 mg Oral BID WC  . heparin injection (subcutaneous)  5,000 Units Subcutaneous Q8H  . hydrALAZINE  25 mg Oral TID  . isosorbide mononitrate  30 mg Oral Daily    Continuous Infusions:   Time spent: 59mins I have personally reviewed and interpreted on  12/23/2017 daily labs, tele strips, imagings as discussed above under date review session and assessment and plans.  I reviewed all  nursing notes, pharmacy notes, consultant notes,  vitals, pertinent old records  I have discussed plan of care as described above with RN , patient and family on 12/23/2017   Florencia Reasons MD, PhD  [facility] Pager [phone]. If 7PM-7AM, please contact night-coverage at [email or web address], password [facility] 12/23/2017, 7:56 AM  LOS: 5 days

## 2017-12-24 DIAGNOSIS — I160 Hypertensive urgency: Secondary | ICD-10-CM

## 2017-12-24 DIAGNOSIS — N1330 Unspecified hydronephrosis: Secondary | ICD-10-CM

## 2017-12-24 LAB — CBC
HEMATOCRIT: 47.5 % (ref 39.0–52.0)
HEMOGLOBIN: 14.7 g/dL (ref 13.0–17.0)
MCH: 25.8 pg — ABNORMAL LOW (ref 26.0–34.0)
MCHC: 30.9 g/dL (ref 30.0–36.0)
MCV: 83.3 fL (ref 80.0–100.0)
Platelets: 202 10*3/uL (ref 150–400)
RBC: 5.7 MIL/uL (ref 4.22–5.81)
RDW: 14 % (ref 11.5–15.5)
WBC: 8.5 10*3/uL (ref 4.0–10.5)
nRBC: 0 % (ref 0.0–0.2)

## 2017-12-24 LAB — COMPREHENSIVE METABOLIC PANEL
ALBUMIN: 3.7 g/dL (ref 3.5–5.0)
ALK PHOS: 85 U/L (ref 38–126)
ALT: 45 U/L — AB (ref 0–44)
ANION GAP: 8 (ref 5–15)
AST: 37 U/L (ref 15–41)
BILIRUBIN TOTAL: 0.6 mg/dL (ref 0.3–1.2)
BUN: 37 mg/dL — ABNORMAL HIGH (ref 8–23)
CALCIUM: 9.3 mg/dL (ref 8.9–10.3)
CO2: 23 mmol/L (ref 22–32)
CREATININE: 1.81 mg/dL — AB (ref 0.61–1.24)
Chloride: 105 mmol/L (ref 98–111)
GFR calc non Af Amer: 38 mL/min — ABNORMAL LOW (ref >60)
GFR, EST AFRICAN AMERICAN: 44 mL/min — AB (ref >60)
GLUCOSE: 117 mg/dL — AB (ref 70–99)
Potassium: 4.4 mmol/L (ref 3.5–5.1)
SODIUM: 136 mmol/L (ref 135–145)
TOTAL PROTEIN: 6.9 g/dL (ref 6.5–8.1)

## 2017-12-24 LAB — RHEUMATOID FACTOR: Rhuematoid fact SerPl-aCnc: 10.6 IU/mL (ref 0.0–13.9)

## 2017-12-24 LAB — ANTINUCLEAR ANTIBODIES, IFA: ANA Ab, IFA: NEGATIVE

## 2017-12-24 LAB — PROCALCITONIN: Procalcitonin: 48.98 ng/mL

## 2017-12-24 MED ORDER — HYDRALAZINE HCL 50 MG PO TABS: 50.0000 mg | ORAL_TABLET | Freq: Three times a day (TID) | ORAL | Status: DC

## 2017-12-24 MED ORDER — ASPIRIN 81 MG PO TBEC: 81.0000 mg | DELAYED_RELEASE_TABLET | Freq: Every day | ORAL | 0 refills | Status: DC

## 2017-12-24 MED ORDER — ISOSORBIDE MONONITRATE ER 60 MG PO TB24: 60.0000 mg | ORAL_TABLET | Freq: Every day | ORAL | 0 refills | Status: DC

## 2017-12-24 MED ORDER — ISOSORBIDE MONONITRATE ER 60 MG PO TB24: 60.0000 mg | ORAL_TABLET | Freq: Every day | ORAL | Status: DC

## 2017-12-24 MED ORDER — HYDRALAZINE HCL 50 MG PO TABS: 50.0000 mg | ORAL_TABLET | Freq: Three times a day (TID) | ORAL | 0 refills | Status: DC

## 2017-12-24 MED ORDER — CARVEDILOL 6.25 MG PO TABS: 6.2500 mg | ORAL_TABLET | Freq: Two times a day (BID) | ORAL | 0 refills | Status: DC

## 2017-12-24 MED ORDER — TAMSULOSIN HCL 0.4 MG PO CAPS: 0.4000 mg | ORAL_CAPSULE | Freq: Every day | ORAL | 0 refills | Status: DC

## 2017-12-24 NOTE — Discharge Summary (Signed)
Physician Discharge Summary  Sherill Wegener GDJ:[id number] DOB: [birth year] DOA: 12/17/2017  PCP: Patient, No Pcp Per  Admit date: 12/17/2017 Discharge date: 12/24/2017  Admitted From: Home Disposition: Home  Recommendations for Outpatient Follow-up:  1. Follow up with PCP in 1 week with repeat CBC/BMP 2. Follow-up with cardiology as an outpatient 3. Follow-up with urology/Dr. Tresa Moore as an outpatient 4. Follow-up in the ED if symptoms worsen or new appear   Home Health: No Equipment/Devices: None  Discharge Condition: Stable CODE STATUS: Full Diet recommendation: Heart Healthy /fluid restriction up to 1500 cc a day  Brief/Interim Summary: 67 year old male with history of hypertension who was not taking medication for the last many years presented on 12/17/2017 with chest pain.  Patient was admitted with hypertensive urgency with systolic blood pressure of 230 and was initially started on Cardene drip.  During the hospitalization, he was found to have acute systolic and diastolic CHF with EF of 30 to 35%.  Cardiology was consulted.  He was started on intravenous diuretics.  His blood pressure improved, Cardene drip was discontinued.  He was also found to have mild to moderate left hydroureteronephrosis for which urology was consulted and recommended outpatient follow-up.  He developed AKI, diuretics were discontinued.  His renal function is improving.  He does not look volume overloaded.  She will be discharged home today with outpatient follow-up with cardiology and urology.  Discharge Diagnoses:  Principal Problem:   Hypertensive emergency Active Problems:   Acute CHF (congestive heart failure) (HCC)   Chest pain   ARF (acute renal failure) (HCC)   Hydroureteronephrosis, left, mild to moderate   Elevated troponin   Cardiomyopathy ([place]), new, EF 30-35%   Penile pain   Hyperthyroidism   Demand ischemia (HCC)  Fever -Questionable cause.  Resolved.  Monitored off antibiotics.  Cultures  negative so far  Acute systolic and diastolic CHF -Echo showed EF of 30 to 35% -Currently no signs of volume overload.  Diuretics have been discontinued because of AKI.  Cardiology recommends Coreg 6.25 mg twice a day, hydralazine 50 mg 3 times a day, isosorbide mononitrate 60 mg daily along with aspirin 81 mg daily.  Outpatient follow-up with cardiology and outpatient repeat echocardiogram.  AKI on CKD stage III -Initial CT showed mild to moderate left hydroureteronephrosis without obstructing stone.  Urology/Dr. Tresa Moore consulted on 12/19/2017 and recommended outpatient follow-up with urology.  Flomax has been started which will be continued -Creatinine peaked to 2.64, today is 1.81.  Diuretics have been discontinued.  Outpatient follow-up with repeat BMP. -Renal ultrasound on 12/23/2017 was negative for hydronephrosis -Patient received IV contrast on initial presentation for CTA chest and abdomen  Leukocytosis -Probably reactive.  Resolved  Hypertension with hypertensive urgency -Improved.  Antihypertensive plan as above.  Outpatient follow-up  Discharge Instructions  Discharge Instructions    (HEART FAILURE PATIENTS) Call MD:  Anytime you have any of the following symptoms: 1) 3 pound weight gain in 24 hours or 5 pounds in 1 week 2) shortness of breath, with or without a dry hacking cough 3) swelling in the hands, feet or stomach 4) if you have to sleep on extra pillows at night in order to breathe.   Complete by:  As directed    Call MD for:  difficulty breathing, headache or visual disturbances   Complete by:  As directed    Call MD for:  extreme fatigue   Complete by:  As directed    Call MD for:  hives   Complete  by:  As directed    Call MD for:  persistant dizziness or light-headedness   Complete by:  As directed    Call MD for:  persistant nausea and vomiting   Complete by:  As directed    Call MD for:  severe uncontrolled pain   Complete by:  As directed    Call MD for:   temperature >100.4   Complete by:  As directed    Diet - low sodium heart healthy   Complete by:  As directed    Increase activity slowly   Complete by:  As directed      Allergies as of 12/24/2017      Reactions   Heparin Shortness Of Breath   Patient family stated when heparin is given patient starts shaking, wheezing. [facility] notified.      Medication List    STOP taking these medications   ibuprofen 200 MG tablet Commonly known as:  ADVIL,MOTRIN     TAKE these medications   aspirin 81 MG EC tablet Take 1 tablet (81 mg total) by mouth daily. Start taking on:  12/25/2017   carvedilol 6.25 MG tablet Commonly known as:  COREG Take 1 tablet (6.25 mg total) by mouth 2 (two) times daily with a meal.   hydrALAZINE 50 MG tablet Commonly known as:  APRESOLINE Take 1 tablet (50 mg total) by mouth 3 (three) times daily.   isosorbide mononitrate 60 MG 24 hr tablet Commonly known as:  IMDUR Take 1 tablet (60 mg total) by mouth daily. Start taking on:  12/25/2017   tamsulosin 0.4 MG Caps capsule Commonly known as:  FLOMAX Take 1 capsule (0.4 mg total) by mouth daily after supper.      Follow-up Information    Alexis Frock, MD Follow up in 3 week(s).   Specialty:  Urology Contact information: [place] [address] [phone]        follow up with cardiology for heart failure Follow up.   Why:  repeat echocardiogram        follow up with primary care doctor for blood pressure control. Call in 2 week(s).   Why:  repeat cbc/cmp at hospital follow up.       [facility] Follow up in 3 week(s).   Specialty:  Cardiology Contact information: [address], [place] [address] [phone]         Allergies  Allergen Reactions  . Heparin Shortness Of Breath    Patient family stated when heparin is given patient starts shaking, wheezing. [facility] notified.     Consultations:  Cardiology/urology   Procedures/Studies: Dg Chest 2 View  Result Date: 12/17/2017 CLINICAL DATA:  One day history of chest pain, shortness of breath and generalized body aches. EXAM: CHEST - 2 VIEW COMPARISON:  None. FINDINGS: AP SEMI-ERECT and LATERAL images were obtained. Cardiac silhouette markedly enlarged even allowing for AP technique. Thoracic aorta tortuous and mildly atherosclerotic. Hilar and mediastinal contours otherwise unremarkable. Mild diffuse interstitial pulmonary edema as there are scattered Kerley B lines scattered throughout both lungs. No confluent airspace consolidation. No significant pleural effusions. Visualized bony thorax intact. IMPRESSION: Mild CHF, with marked cardiomegaly and mild diffuse interstitial pulmonary edema. Electronically Signed   By: Evangeline Dakin M.D.   On: 12/17/2017 17:23   US Renal  Result Date: 12/23/2017 CLINICAL DATA:  Elevated creatinine EXAM: RENAL / URINARY TRACT ULTRASOUND COMPLETE COMPARISON:  None. FINDINGS: Right Kidney: Length:  9.9 cm. Mild diffuse parenchymal atrophy. No focal mass lesion. No hydronephrosis. Left Kidney: Length: 9.4 cm. Mild diffuse parenchymal atrophy. No focal mass lesion. No hydronephrosis. Bladder: Appears normal for degree of bladder distention. IMPRESSION: Mild renal parenchymal atrophy bilaterally.  No hydronephrosis. Electronically Signed   By: Lucienne Capers M.D.   On: 12/23/2017 03:19   Dg Chest Port 1 View  Result Date: 12/21/2017 CLINICAL DATA:  Respiratory distress.  Stridor.  Fever. EXAM: PORTABLE CHEST 1 VIEW COMPARISON:  Radiographs and CT 12/17/2017 FINDINGS: Lower lung volumes from prior exam. Cardiomegaly appears similar allowing for differences in technique. Improved pulmonary edema. No focal airspace disease. No pleural effusion or pneumothorax. Unchanged osseous structures. IMPRESSION: 1. Resolved CHF.  Mild cardiomegaly persists. 2. No focal airspace disease. Electronically  Signed   By: Keith Rake M.D.   On: 12/21/2017 22:08   Dg Abd Portable 1v  Result Date: 12/19/2017 CLINICAL DATA:  Hydronephrosis.  Penile pain with urination. EXAM: PORTABLE ABDOMEN - 1 VIEW COMPARISON:  None. FINDINGS: The bowel gas pattern is normal. Degenerative changes are evident at L4-5. Axial skeleton is otherwise unremarkable. IMPRESSION: 1. Normal bowel gas pattern. 2. Degenerative changes within the lumbar spine. Electronically Signed   By: San Morelle M.D.   On: 12/19/2017 15:29   [place] Lt Lower Extrem Ltd Soft Tissue Non Vascular  Result Date: 12/18/2017 CLINICAL DATA:  Left upper thigh mass. EXAM: ULTRASOUND LEFT LOWER EXTREMITY LIMITED TECHNIQUE: Ultrasound examination of the lower extremity soft tissues was performed in the area of clinical concern. COMPARISON:  CTA chest, abdomen, and pelvis 12/17/2017. FINDINGS: Targeted ultrasound in the region of clinical concern demonstrates an 8.9 x 3.4 x 4.7 cm subcutaneous soft tissue mass in the medial left upper thigh which is hyperechoic relative to adjacent muscle. No increased vascularity is evident within the mass on color Doppler imaging. The superior most aspect of a suspected lipoma is visible in this region on yesterday's CT. IMPRESSION: 9 cm medial left thigh mass likely representing a lipoma. Electronically Signed   By: Logan Bores M.D.   On: 12/18/2017 11:53   Ct Angio Chest/abd/pel For Dissection W And/or W/wo  Result Date: 12/17/2017 CLINICAL DATA:  Hypertension, shortness of breath, chest pain EXAM: CT ANGIOGRAPHY CHEST, ABDOMEN AND PELVIS TECHNIQUE: Multidetector CT imaging through the chest, abdomen and pelvis was performed using the standard protocol during bolus administration of intravenous contrast. Multiplanar reconstructed images and MIPs were obtained and reviewed to evaluate the vascular anatomy. CONTRAST:  52mL ISOVUE-370 IOPAMIDOL (ISOVUE-370) INJECTION 76% COMPARISON:  12/17/2017 FINDINGS: CTA CHEST  FINDINGS Cardiovascular: Atherosclerosis of the thoracic aorta. Negative for significant dissection, aneurysm, or hyperdense intramural hematoma. Patent tortuous three-vessel arch anatomy. No mediastinal hemorrhage or hematoma. Central pulmonary arteries appear patent. Is mildly enlarged. No pericardial effusion. Mediastinum/Nodes: Thyroid mildly heterogeneous. Trachea and central airways are patent. Esophagus is nondilated. No significant hernia. No adenopathy. Lungs/Pleura: Upper lobe interlobular septal thickening. Diffuse respiratory motion artifact. Mild scattered ground-glass opacity throughout both lungs. Low lung volumes with basilar vascular crowding. Appearance suggest early interstitial edema. No definite focal acute airspace process, collapse or consolidation. No interstitial lung disease. No pleural abnormality, effusion, or pneumothorax. Musculoskeletal: No acute osseous finding. No compression fracture. Sternum intact. Review of the MIP images confirms the above findings. CTA ABDOMEN AND PELVIS FINDINGS VASCULAR Aorta: Abdominal atherosclerosis and mild tortuosity without aneurysm, dissection, occlusive process or acute vascular finding. No retroperitoneal hemorrhage or hematoma. Celiac: Widely patent including its branches SMA: Widely patent including its  branches Renals: Both main renal arteries are patent. IMA: Remains patent off the distal aorta including its branches Inflow: Atherosclerosis and tortuosity of the iliac vessels. Iliac vessels remain patent without inflow disease. The common, internal and external iliac arteries are all patent. Veins: Venous phase imaging not performed. Review of the MIP images confirms the above findings. NON-VASCULAR Hepatobiliary: No focal liver abnormality is seen. No gallstones, gallbladder wall thickening, or biliary dilatation. Pancreas: Unremarkable. No pancreatic ductal dilatation or surrounding inflammatory changes. Spleen: Normal in size without focal  abnormality. Adrenals/Urinary Tract: Normal adrenal glands. Right kidney and ureter demonstrate no acute process, obstructive uropathy, hydroureter, or ureteral calculus. Bladder unremarkable. Left kidney demonstrates chronic appearing mild to moderate hydronephrosis and proximal hydroureter without obstructing radiopaque left ureteral calculus. Stomach/Bowel: Negative for bowel obstruction, significant dilatation, ileus, or free air. Appendix not visualized. No acute inflammatory process, fluid collection, or abscess. Lymphatic: No adenopathy Reproductive: Unremarkable Other: No abdominal wall hernia or abnormality. No abdominopelvic ascites. Musculoskeletal: Degenerative changes of the spine. No acute osseous finding. Review of the MIP images confirms the above findings. IMPRESSION: Thoraco abdominal aortic atherosclerosis without acute dissection, significant aneurysm, occlusive disease or other acute vascular process. Cardiomegaly with mild pulmonary interstitial edema pattern suggesting early CHF or volume overload. Mild-to-moderate left hydroureteronephrosis without obstructing left ureteral calculus appearing more chronic than acute without surrounding perinephric edema. Consider nonemergent urology follow-up No other acute intra-abdominal or pelvic finding. Electronically Signed   By: Jerilynn Mages.  Shick M.D.   On: 12/17/2017 19:30    Echo showed EF of 30 to 35%   Subjective: Patient seen and examined at bedside.  He denies any overnight fever, nausea, vomiting or chest pain.  He feels okay to go home.  Discharge Exam: Vitals:   12/24/17 0047 12/24/17 0957  BP: (!) 154/90 (!) 162/91  Pulse:  72  Resp:    Temp:    SpO2:     Vitals:   12/23/17 2057 12/24/17 0047 12/24/17 0615 12/24/17 0957  BP: 138/90 (!) 154/90  (!) 162/91  Pulse: 60   72  Resp: 17     Temp: 97.7 F (36.5 C)     TempSrc: Oral     SpO2: 98%     Weight:   81.6 kg   Height:        General: Pt is alert, awake, not in acute  distress Cardiovascular: rate controlled, S1/S2 + Respiratory: bilateral decreased breath sounds at bases with some scattered crackles Abdominal: Soft, NT, ND, bowel sounds + Extremities: Trace edema, no cyanosis    The results of significant diagnostics from this hospitalization (including imaging, microbiology, ancillary and laboratory) are listed below for reference.     Microbiology: Recent Results (from the past 240 hour(s))  Urine culture     Status: Abnormal   Collection Time: 12/17/17  9:30 PM  Result Value Ref Range Status   Specimen Description   Final    URINE, CLEAN CATCH Performed at [facility], [place] [address] [place], [place] [address]    Special Requests   Final    NONE Performed at [facility], [place] [address], [place], [place] [address]    Culture (A)  Final    <10,000 COLONIES/mL INSIGNIFICANT GROWTH Performed at [place] [address], [place], [place] [address]    Report Status 12/19/2017 FINAL  Final  MRSA PCR Screening     Status: None   Collection Time: 12/19/17  1:15 AM  Result  Value Ref Range Status   MRSA by PCR NEGATIVE NEGATIVE Final    Comment:        The GeneXpert MRSA Assay (FDA approved for NASAL specimens only), is one component of a comprehensive MRSA colonization surveillance program. It is not intended to diagnose MRSA infection nor to guide or monitor treatment for MRSA infections. Performed at [facility], [place] [address], [place], [place] [address]   Culture, blood (routine x 2)     Status: None (Preliminary result)   Collection Time: 12/21/17 11:36 PM  Result Value Ref Range Status   Specimen Description BLOOD LEFT ARM  Final   Special Requests   Final    BOTTLES DRAWN AEROBIC AND ANAEROBIC Blood Culture results may not be optimal due to an excessive volume of blood received in culture bottles   Culture   Final    NO GROWTH 2 DAYS Performed at [place], [place] [address], [place], [place] [address]    Report Status PENDING  Incomplete  Culture, blood (routine x 2)     Status: None (Preliminary result)   Collection Time: 12/21/17 11:36 PM  Result Value Ref Range Status   Specimen Description BLOOD LEFT HAND  Final   Special Requests   Final    BOTTLES DRAWN AEROBIC AND ANAEROBIC Blood Culture adequate volume   Culture   Final    NO GROWTH 2 DAYS Performed at [facility], [place] [address] [place], [place] [address]    Report Status PENDING  Incomplete     Labs: BNP (last 3 results) Recent Labs    12/17/17 1610  BNP 6,568.1*   Basic Metabolic Panel: Recent Labs  Lab 12/21/17 1037 12/21/17 2248 12/22/17 0451 12/23/17 0612 12/24/17 0430  NA 136 133* 133* 137 136  K 4.5 3.9 3.7 3.9 4.4  CL 101 102 100 105 105  CO2 24 21* 23 24 23   GLUCOSE 114* 167* 139* 108* 117*  BUN 47* 50* 53* 48* 37*  CREATININE 2.13* 2.28* 2.64* 2.22* 1.81*  CALCIUM 9.4 8.9 8.7* 8.7* 9.3   Liver Function Tests: Recent Labs  Lab 12/23/17 0612 12/24/17 0430  AST 52* 37  ALT 52* 45*  ALKPHOS 79 85  BILITOT 0.9 0.6  PROT 6.4* 6.9  ALBUMIN 3.4* 3.7   No results for input(s): LIPASE, AMYLASE in the last 168 hours. No results for input(s): AMMONIA in the last 168 hours. CBC: Recent Labs  Lab 12/18/17 0625 12/21/17 2248 12/22/17 0451 12/23/17 0612 12/24/17 0430  WBC 10.0 8.1 20.0* 13.0* 8.5  NEUTROABS  --  7.7  --  10.1*  --   HGB 15.1 14.3 14.0 14.5 14.7  HCT 45.3 44.6 43.7 45.1 47.5  MCV 82.7 84.3 85.0 84.0 83.3  PLT 201 160 164 155 202   Cardiac Enzymes: Recent Labs  Lab 12/17/17 2335 12/18/17 0625 12/21/17 2248  TROPONINI 0.08* 0.06* 0.06*   BNP: Invalid input(s): POCBNP CBG: Recent Labs  Lab 12/18/17 1534 12/21/17 1329 12/21/17 2151  GLUCAP 126* 187* 79   D-Dimer No results for input(s): DDIMER in the last 72 hours. Hgb A1c No results for input(s): HGBA1C in the last 72 hours. Lipid Profile No results for  input(s): CHOL, HDL, LDLCALC, TRIG, CHOLHDL, LDLDIRECT in the last 72 hours. Thyroid function studies No results for input(s): TSH, T4TOTAL, T3FREE, THYROIDAB in the last 72 hours.  Invalid input(s): FREET3 Anemia work up No results for input(s): VITAMINB12, FOLATE, FERRITIN, TIBC, IRON,  RETICCTPCT in the last 72 hours. Urinalysis    Component Value Date/Time   COLORURINE YELLOW 12/22/2017 1729   APPEARANCEUR CLEAR 12/22/2017 1729   LABSPEC 1.020 12/22/2017 1729   PHURINE 5.0 12/22/2017 1729   GLUCOSEU NEGATIVE 12/22/2017 1729   HGBUR NEGATIVE 12/22/2017 1729   BILIRUBINUR NEGATIVE 12/22/2017 1729   KETONESUR NEGATIVE 12/22/2017 1729   PROTEINUR NEGATIVE 12/22/2017 1729   NITRITE NEGATIVE 12/22/2017 1729   LEUKOCYTESUR NEGATIVE 12/22/2017 1729   Sepsis Labs Invalid input(s): PROCALCITONIN,  WBC,  LACTICIDVEN Microbiology Recent Results (from the past 240 hour(s))  Urine culture     Status: Abnormal   Collection Time: 12/17/17  9:30 PM  Result Value Ref Range Status   Specimen Description   Final    URINE, CLEAN CATCH Performed at [facility], [place] [address] [place], [place] [address]    Special Requests   Final    NONE Performed at [facility], [place] [address], [place], [place] [address]    Culture (A)  Final    <10,000 COLONIES/mL INSIGNIFICANT GROWTH Performed at [place] [address] [place], [place] [address]    Report Status 12/19/2017 FINAL  Final  MRSA PCR Screening     Status: None   Collection Time: 12/19/17  1:15 AM  Result Value Ref Range Status   MRSA by PCR NEGATIVE NEGATIVE Final    Comment:        The GeneXpert MRSA Assay (FDA approved for NASAL specimens only), is one component of a comprehensive MRSA colonization surveillance program. It is not intended to diagnose MRSA infection nor to guide or monitor treatment for MRSA infections. Performed at [facility], [place] [address],  [place], [place] [address]   Culture, blood (routine x 2)     Status: None (Preliminary result)   Collection Time: 12/21/17 11:36 PM  Result Value Ref Range Status   Specimen Description BLOOD LEFT ARM  Final   Special Requests   Final    BOTTLES DRAWN AEROBIC AND ANAEROBIC Blood Culture results may not be optimal due to an excessive volume of blood received in culture bottles   Culture   Final    NO GROWTH 2 DAYS Performed at [facility], [place] [address], [place], [place] [address]    Report Status PENDING  Incomplete  Culture, blood (routine x 2)     Status: None (Preliminary result)   Collection Time: 12/21/17 11:36 PM  Result Value Ref Range Status   Specimen Description BLOOD LEFT HAND  Final   Special Requests   Final    BOTTLES DRAWN AEROBIC AND ANAEROBIC Blood Culture adequate volume   Culture   Final    NO GROWTH 2 DAYS Performed at [facility], [place] [address] [place], [place] [address]    Report Status PENDING  Incomplete     Time coordinating discharge: 35 minutes  SIGNED:   Aline August, MD  [facility] 12/24/2017, 10:51 AM Pager: [phone]  If 7PM-7AM, please contact night-coverage [email or web address] Password [email or web address]

## 2017-12-24 NOTE — Evaluation (Signed)
Physical Therapy Evaluation Patient Details Name: Daniel Obrien MRN: [id number] DOB: 09-16-1950 Today's Date: 12/24/2017   History of Present Illness  67 yo male with onset of hypertensive emergency was admitted and noted his HR on admission was 132.  Translation was obtained for his language but wife speaks another language and cannot get that translated directly but via husband to the translator.  Pt has had mild elevation in troponin and chest pain at admission, but has CHF per cardiology, with continued BP increases at times from previous non use of meds. Also hematuria, renal failure with no comparison labs, and resolving HTN.  PMHx:  HTN,   Clinical Impression  Pt was seen for assessment of tolerance to move, via translator for all history and mobility commands.  He is noted to have 96% O2 sat after gait, pulses were 74 at end of session and not worrisome for his condition.  Has been cleared by cardiologist, and has complete access to family around the clock.  Will see him acutely just for mobility as tolerated then plan dc to home with no follow up therapy.  Pt is agreeable with this and all plans have been given through translation.  Wife does not speak [place] so husband relayed her comments in his language for translation.    Follow Up Recommendations No PT follow up    Equipment Recommendations  Rolling walker with 5" wheels    Recommendations for Other Services       Precautions / Restrictions Precautions Precautions: Fall(telemetry) Restrictions Weight Bearing Restrictions: No      Mobility  Bed Mobility Overal bed mobility: Needs Assistance Bed Mobility: Supine to Sit;Sit to Supine     Supine to sit: Supervision Sit to supine: Min guard   General bed mobility comments: pt is using bedrail briefly  Transfers Overall transfer level: Needs assistance Equipment used: Rolling walker (2 wheeled);1 person hand held assist Transfers: Sit to/from Stand Sit to Stand:  Supervision         General transfer comment: pt is able to stand up and control balance with RW  Ambulation/Gait Ambulation/Gait assistance: Min guard Gait Distance (Feet): 150 Feet Assistive device: Rolling walker (2 wheeled);1 person hand held assist Gait Pattern/deviations: Step-through pattern;Narrow base of support;Decreased stride length Gait velocity: reduced Gait velocity interpretation: <1.31 ft/sec, indicative of household ambulator General Gait Details: paced walk with assistance needed for monitoring his use of walker in turns and monitoring his fatigue  Stairs            Wheelchair Mobility    Modified Rankin (Stroke Patients Only)       Balance Overall balance assessment: Needs assistance Sitting-balance support: Feet supported Sitting balance-Leahy Scale: Good     Standing balance support: Bilateral upper extremity supported;During functional activity Standing balance-Leahy Scale: Fair                               Pertinent Vitals/Pain Pain Assessment: No/denies pain    Home Living Family/patient expects to be discharged to:: Private residence Living Arrangements: Spouse/significant other;Children Available Help at Discharge: Family;Available 24 hours/day Type of Home: House Home Access: Stairs to enter Entrance Stairs-Rails: Right;Left;Can reach both Entrance Stairs-Number of Steps: 6 or 2 depending Home Layout: One level Home Equipment: Cane - single point Additional Comments: will want a RW for home    Prior Function Level of Independence: Independent with assistive device(s)  Comments: family there to assist him as needed but mod I for gait previously     Hand Dominance   Dominant Hand: Right    Extremity/Trunk Assessment   Upper Extremity Assessment Upper Extremity Assessment: Overall WFL for tasks assessed    Lower Extremity Assessment Lower Extremity Assessment: Overall WFL for tasks assessed     Cervical / Trunk Assessment Cervical / Trunk Assessment: Normal  Communication   Communication: Prefers language other than [place];Interpreter utilized  Cognition Arousal/Alertness: Awake/alert Behavior During Therapy: WFL for tasks assessed/performed Overall Cognitive Status: Within Functional Limits for tasks assessed                                        General Comments      Exercises     Assessment/Plan    PT Assessment Patient needs continued PT services  PT Problem List Decreased range of motion;Decreased activity tolerance;Decreased balance;Decreased mobility;Decreased coordination;Decreased knowledge of use of DME;Decreased safety awareness;Cardiopulmonary status limiting activity       PT Treatment Interventions DME instruction;Gait training;Stair training;Functional mobility training;Therapeutic activities;Therapeutic exercise;Balance training;Neuromuscular re-education;Patient/family education    PT Goals (Current goals can be found in the Care Plan section)  Acute Rehab PT Goals Patient Stated Goal: to get up to walk PT Goal Formulation: With patient Time For Goal Achievement: 12/31/17 Potential to Achieve Goals: Good    Frequency Min 3X/week   Barriers to discharge Other (comment) may need a RW for home    Co-evaluation               AM-PAC PT "6 Clicks" Daily Activity  Outcome Measure Difficulty turning over in bed (including adjusting bedclothes, sheets and blankets)?: A Little Difficulty moving from lying on back to sitting on the side of the bed? : A Little Difficulty sitting down on and standing up from a chair with arms (e.g., wheelchair, bedside commode, etc,.)?: Unable Help needed moving to and from a bed to chair (including a wheelchair)?: A Little Help needed walking in hospital room?: A Little Help needed climbing 3-5 steps with a railing? : A Little 6 Click Score: 16    End of Session Equipment Utilized During  Treatment: Gait belt Activity Tolerance: Patient tolerated treatment well;Patient limited by fatigue Patient left: in bed;with call bell/phone within reach;with family/visitor present(translator signed off after treatment) Nurse Communication: Mobility status PT Visit Diagnosis: Unsteadiness on feet (R26.81);Difficulty in walking, not elsewhere classified (R26.2)    Time: 0737-0800 PT Time Calculation (min) (ACUTE ONLY): 23 min   Charges:   PT Evaluation $PT Eval Moderate Complexity: 1 Mod PT Treatments $Gait Training: 8-22 mins        Ramond Dial 12/24/2017, 11:42 AM  Mee Hives, PT MS Acute Rehab Dept. Number: [place] [facility] [place]

## 2017-12-24 NOTE — Progress Notes (Signed)
Progress Note  Patient Name: Daniel Obrien Date of Encounter: 12/24/2017  Primary Cardiologist: Ena Dawley, MD   Subjective  COmfortable in bed lying flat   NO CP    Inpatient Medications    Scheduled Meds: . aspirin EC  81 mg Oral Daily  . carvedilol  6.25 mg Oral BID WC  . heparin injection (subcutaneous)  5,000 Units Subcutaneous Q8H  . hydrALAZINE  25 mg Oral TID  . isosorbide mononitrate  30 mg Oral Daily  . tamsulosin  0.4 mg Oral Daily   Continuous Infusions:  PRN Meds: acetaminophen **OR** acetaminophen, hydrALAZINE, Influenza vac split quadrivalent PF, ondansetron **OR** ondansetron (ZOFRAN) IV   Vital Signs    Vitals:   12/23/17 1646 12/23/17 2057 12/24/17 0047 12/24/17 0615  BP: (!) 144/85 138/90 (!) 154/90   Pulse: 66 60    Resp: 18 17    Temp:  97.7 F (36.5 C)    TempSrc:  Oral    SpO2: 96% 98%    Weight:    81.6 kg  Height:        Intake/Output Summary (Last 24 hours) at 12/24/2017 0930 Last data filed at 12/24/2017 0615 Gross per 24 hour  Intake 600 ml  Output 200 ml  Net 400 ml   Filed Weights   12/21/17 0451 12/22/17 0357 12/24/17 0615  Weight: 81.4 kg 83.1 kg 81.6 kg    Telemetry    Sinus     - Personally Reviewed  ECG    None - Personally Reviewed  Physical Exam  Pt comfortable laying flat in bed   GEN: No acute distress.   Neck: JVP is notelevated   Cardiac: RRR, no murmurs, rubs, or gallops.  Respiratory: Clear to auscultation bilaterally. GI: Soft, nontender, non-distended  MS: No edema; No deformity. Neuro:  Nonfocal  Psych: Normal affect   Labs    Chemistry Recent Labs  Lab 12/22/17 0451 12/23/17 0612 12/24/17 0430  NA 133* 137 136  K 3.7 3.9 4.4  CL 100 105 105  CO2 23 24 23   GLUCOSE 139* 108* 117*  BUN 53* 48* 37*  CREATININE 2.64* 2.22* 1.81*  CALCIUM 8.7* 8.7* 9.3  PROT  --  6.4* 6.9  ALBUMIN  --  3.4* 3.7  AST  --  52* 37  ALT  --  52* 45*  ALKPHOS  --  79 85  BILITOT  --  0.9 0.6  GFRNONAA  24* 29* 38*  GFRAA 28* 34* 44*  ANIONGAP 10 8 8      Hematology Recent Labs  Lab 12/22/17 0451 12/23/17 0612 12/24/17 0430  WBC 20.0* 13.0* 8.5  RBC 5.14 5.37 5.70  HGB 14.0 14.5 14.7  HCT 43.7 45.1 47.5  MCV 85.0 84.0 83.3  MCH 27.2 27.0 25.8*  MCHC 32.0 32.2 30.9  RDW 14.0 13.9 14.0  PLT 164 155 202    Cardiac Enzymes Recent Labs  Lab 12/17/17 2335 12/18/17 0625 12/21/17 2248  TROPONINI 0.08* 0.06* 0.06*    Recent Labs  Lab 12/17/17 1617 12/17/17 2012  TROPIPOC 0.07 0.08     BNP Recent Labs  Lab 12/17/17 1610  BNP 1,450.7*     DDimer No results for input(s): DDIMER in the last 168 hours.   Radiology    US Renal  Result Date: 12/23/2017 CLINICAL DATA:  Elevated creatinine EXAM: RENAL / URINARY TRACT ULTRASOUND COMPLETE COMPARISON:  None. FINDINGS: Right Kidney: Length: 9.9 cm. Mild diffuse parenchymal atrophy. No focal mass lesion. No hydronephrosis. Left  Kidney: Length: 9.4 cm. Mild diffuse parenchymal atrophy. No focal mass lesion. No hydronephrosis. Bladder: Appears normal for degree of bladder distention. IMPRESSION: Mild renal parenchymal atrophy bilaterally.  No hydronephrosis. Electronically Signed   By: Lucienne Capers M.D.   On: 12/23/2017 03:19    Cardiac Studies   TTE: 12/18/2017 Left ventricle: LVEF is approximately 30% with diffuse hypokinesis. The cavity size was severely dilated. Wall thickness was normal. - Aortic valve: There was mild regurgitation. - Mitral valve: There was mild to moderate regurgitation. - Left atrium: The atrium was severely dilated. - Right ventricle: The cavity size was mildly dilated. Systolic function was moderately reduced. - Right atrium: The atrium was mildly dilated  Patient Profile     67 y.o. male hx of acute systolic / diastolic CHF, HTN who presents with exacerbation    Assessment & Plan    1  Acute systolic / diastolic CHF Etiology not clear Pt now on medical Rx    Volume is good  Plan  for medical Rx with f/u echo in a few months  V  2  Troponin elevation  Trivial   Flat   May represent demand in setting of CHF    3  HTN  BP is high today   Would increase hydralazine to 50 tid   Imdur 60 Continue other meds      3  Renal  Cr improved from yesterday  1.81    Will make sure he has appt in cardiology clinic     For questions or updates, please contact [facility] Please consult [email or web address] for contact info under        Signed, Dorris Carnes, MD  12/24/2017, 9:30 AM

## 2017-12-24 NOTE — Plan of Care (Signed)
  Problem: Clinical Measurements: Goal: Ability to maintain clinical measurements within normal limits will improve Outcome: Progressing   Problem: Clinical Measurements: Goal: Cardiovascular complication will be avoided Outcome: Progressing   

## 2017-12-25 LAB — HEPATITIS PANEL, ACUTE
HCV Ab: <0.1 s/co ratio (ref 0.0–0.9)
HEP B C IGM: NEGATIVE
Hep A IgM: NEGATIVE
Hepatitis B Surface Ag: NEGATIVE

## 2017-12-26 LAB — CULTURE, BLOOD (ROUTINE X 2)
Culture: NO GROWTH
Culture: NO GROWTH
SPECIAL REQUESTS: ADEQUATE

## 2018-01-02 DIAGNOSIS — Z23 Encounter for immunization: Secondary | ICD-10-CM | POA: Diagnosis not present

## 2018-01-02 DIAGNOSIS — I5022 Chronic systolic (congestive) heart failure: Secondary | ICD-10-CM | POA: Diagnosis not present

## 2018-01-02 DIAGNOSIS — I10 Essential (primary) hypertension: Secondary | ICD-10-CM | POA: Diagnosis not present

## 2018-01-02 DIAGNOSIS — N183 Chronic kidney disease, stage 3 (moderate): Secondary | ICD-10-CM | POA: Diagnosis not present

## 2018-01-21 DIAGNOSIS — N1330 Unspecified hydronephrosis: Secondary | ICD-10-CM | POA: Diagnosis not present

## 2018-01-21 DIAGNOSIS — N390 Urinary tract infection, site not specified: Secondary | ICD-10-CM | POA: Diagnosis not present

## 2018-01-21 DIAGNOSIS — N400 Enlarged prostate without lower urinary tract symptoms: Secondary | ICD-10-CM | POA: Diagnosis not present

## 2018-01-23 ENCOUNTER — Ambulatory Visit (INDEPENDENT_AMBULATORY_CARE_PROVIDER_SITE_OTHER): Payer: Medicare Other | Admitting: Cardiology

## 2018-01-23 ENCOUNTER — Encounter: Payer: Self-pay | Admitting: Cardiology

## 2018-01-23 VITALS — BP 140/90 | HR 66 | Ht 66.0 in | Wt 182.0 lb

## 2018-01-23 DIAGNOSIS — I5021 Acute systolic (congestive) heart failure: Secondary | ICD-10-CM

## 2018-01-23 DIAGNOSIS — I420 Dilated cardiomyopathy: Secondary | ICD-10-CM | POA: Diagnosis not present

## 2018-01-23 DIAGNOSIS — N179 Acute kidney failure, unspecified: Secondary | ICD-10-CM

## 2018-01-23 MED ORDER — ISOSORBIDE MONONITRATE ER 60 MG PO TB24: 60.0000 mg | ORAL_TABLET | Freq: Every day | ORAL | 3 refills | Status: DC

## 2018-01-23 MED ORDER — HYDRALAZINE HCL 50 MG PO TABS: 50.0000 mg | ORAL_TABLET | Freq: Three times a day (TID) | ORAL | 3 refills | Status: DC

## 2018-01-23 MED ORDER — CARVEDILOL 12.5 MG PO TABS: 12.5000 mg | ORAL_TABLET | Freq: Two times a day (BID) | ORAL | 3 refills | Status: DC

## 2018-01-23 NOTE — Patient Instructions (Signed)
Medication Instructions:  Please increase your Carvedilol to 12.5 mg twice a day. Continue all other medications as listed.  If you need a refill on your cardiac medications before your next appointment, please call your pharmacy.   Testing/Procedures: Your physician has requested that you have an echocardiogram. Echocardiography is a painless test that uses sound waves to create images of your heart. It provides your doctor with information about the size and shape of your heart and how well your heart's chambers and valves are working. This procedure takes approximately one hour. There are no restrictions for this procedure.  Follow-Up: You w ill need a follow up appointment in 2 months with Truitt Merle, NP.   Thank you for choosing [facility]!!

## 2018-01-23 NOTE — Progress Notes (Signed)
Cardiology Office Note:    Date:  01/23/2018   ID:  Daniel Obrien, DOB 10-Dec-1950, MRN [id number]  PCP:  Patient, No Pcp Per  Cardiologist:  Ena Dawley, MD  Electrophysiologist:  None   Referring MD: No ref. provider found     History of Present Illness:    Daniel Obrien is a 67 y.o. male here for follow-up of hypertensive urgency discharge from hospital on 65/09/8467 with systolic heart failure EF 30 to 35% acute kidney injury.  Diuretics at one point were discontinued.  He was discharged on carvedilol 6.25 mg twice a day, hydralazine 50 mg 3 times a day, isosorbide 60 mg and aspirin.  It was recommended that outpatient follow-up with cardiology and outpatient repeat echocardiogram take place.  Creatinine peak was 2.64.  On discharge 1.81.  Renal ultrasound was negative for hydronephrosis.  At one point during hospitalization he was visibly shaking in bed.  Leukocytosis developed as well as fever.  He was treated with IV antibiotics.  Troponin was mildly elevated likely in the setting of heart failure demand ischemia.  Overall he says his breathing is better but he still having some trouble with this at times.  No orthopnea, no PND.  No further trembling sensations.  Recent urology visit with lab work performed.  No fevers chills nausea vomiting syncope.  Past Medical History:  Diagnosis Date  . Acute CHF (congestive heart failure) ([place]) 12/17/2017  . ARF (acute renal failure) ([place]) 12/17/2017  . Cardiomyopathy ([place]), new, EF 30-35% 12/18/2017  . Chest pain 12/17/2017  . Demand ischemia ([place])   . Diabetes ([place])   . Elevated troponin 12/18/2017  . Hydroureteronephrosis, left, mild to moderate 12/18/2017  . Hypertension   . Hyperthyroidism 12/18/2017  . Penile pain 12/18/2017    Past Surgical History:  Procedure Laterality Date  . NOSE SURGERY      Current Medications: Current Meds  Medication Sig  . aspirin EC 81 MG EC tablet Take 1 tablet (81 mg total) by mouth daily.  . carvedilol  (COREG) 12.5 MG tablet Take 1 tablet (12.5 mg total) by mouth 2 (two) times daily with a meal.  . hydrALAZINE (APRESOLINE) 50 MG tablet Take 1 tablet (50 mg total) by mouth 3 (three) times daily.  . isosorbide mononitrate (IMDUR) 60 MG 24 hr tablet Take 1 tablet (60 mg total) by mouth daily.  . tamsulosin (FLOMAX) 0.4 MG CAPS capsule Take 1 capsule (0.4 mg total) by mouth daily after supper.  . [DISCONTINUED] carvedilol (COREG) 6.25 MG tablet Take 1 tablet (6.25 mg total) by mouth 2 (two) times daily with a meal.  . [DISCONTINUED] hydrALAZINE (APRESOLINE) 50 MG tablet Take 1 tablet (50 mg total) by mouth 3 (three) times daily.  . [DISCONTINUED] isosorbide mononitrate (IMDUR) 60 MG 24 hr tablet Take 1 tablet (60 mg total) by mouth daily.     Allergies:   Heparin   Social History   Socioeconomic History  . Marital status: Married    Spouse name: Not on file  . Number of children: Not on file  . Years of education: Not on file  . Highest education level: Not on file  Occupational History  . Occupation: Retired  Scientific laboratory technician  . Financial resource strain: Not on file  . Food insecurity:    Worry: Not on file    Inability: Not on file  . Transportation needs:    Medical: Not on file    Non-medical: Not on file  Tobacco Use  .  Smoking status: Never Smoker  . Smokeless tobacco: Never Used  Substance and Sexual Activity  . Alcohol use: Never    Frequency: Never  . Drug use: Never  . Sexual activity: Not on file  Lifestyle  . Physical activity:    Days per week: Not on file    Minutes per session: Not on file  . Stress: Not on file  Relationships  . Social connections:    Talks on phone: Not on file    Gets together: Not on file    Attends religious service: Not on file    Active member of club or organization: Not on file    Attends meetings of clubs or organizations: Not on file    Relationship status: Not on file  Other Topics Concern  . Not on file  Social History  Narrative   Pt and wife are [place], [place]. Wife speaks a little [place] and [place], a little [place], mostly [place].   Husband speaks more [place] and [place].     Family History: The patient's family history includes Unexplained death (age of onset: 66) in his father; Unexplained death (age of onset: 74) in his mother. There is no history of Hypertension or Heart disease.  ROS:   Please see the history of present illness.     All other systems reviewed and are negative.  EKGs/Labs/Other Studies Reviewed:    The following studies were reviewed today: Prior hospital notes cardiac studies reviewed EKG reviewed lab work reviewed  EKG:  EKG is not ordered today.    Recent Labs: 12/17/2017: B Natriuretic Peptide 1,450.7; TSH 0.258 12/24/2017: ALT 45; BUN 37; Creatinine, Ser 1.81; Hemoglobin 14.7; Platelets 202; Potassium 4.4; Sodium 136  Recent Lipid Panel No results found for: CHOL, TRIG, HDL, CHOLHDL, VLDL, LDLCALC, LDLDIRECT  Physical Exam:    VS:  BP 140/90   Pulse 66   Ht 5\' 6"  (1.676 m)   Wt 182 lb (82.6 kg)   SpO2 94%   BMI 29.38 kg/m     Wt Readings from Last 3 Encounters:  01/23/18 182 lb (82.6 kg)  12/24/17 180 lb (81.6 kg)     GEN:  Well nourished, well developed in no acute distress HEENT: Normal NECK: No JVD; No carotid bruits LYMPHATICS: No lymphadenopathy CARDIAC: RRR, no murmurs, rubs, gallops RESPIRATORY:  Clear to auscultation without rales, wheezing or rhonchi  ABDOMEN: Soft, non-tender, non-distended MUSCULOSKELETAL:  No edema; No deformity  SKIN: Warm and dry NEUROLOGIC:  Alert and oriented x 3 PSYCHIATRIC:  Normal affect   ASSESSMENT:    1. Dilated cardiomyopathy ([place])   2. Acute systolic heart failure ([place])   3. AKI (acute kidney injury) ([place])    PLAN:    In order of problems listed above:  Dilated cardiomyopathy/acute systolic and diastolic heart failure - Hospitalization records reviewed.  EF 30%.  No evidence of dissection of  aorta on CT scan.  Lasix was on hold because of creatinine elevation.  Appeared euvolemic at discharge.  Medications reviewed as above. - In 2 months, we will repeat echocardiogram.  We will have him follow-up in clinic after that study.  If EF remains low and creatinine is stable, we will pursue coronary angiography to exclude ischemic etiology. -Increase carvedilol to 12.5 mg twice a day.  Elevated troponin -Likely demand ischemia in the setting of his underlying illness, cardiomyopathy.  If his EF does not improve, we will likely proceed with diagnostic cardiac catheterization.  No chest pain.  Acute kidney  injury -Resolved, possibly contrast mediated.  Urology involved during hospitalization.  Urology has recently checked his basic metabolic profile.  I do not have the lab access with me.     Medication Adjustments/Labs and Tests Ordered: Current medicines are reviewed at length with the patient today.  Concerns regarding medicines are outlined above.  Orders Placed This Encounter  Procedures  . ECHOCARDIOGRAM COMPLETE   Meds ordered this encounter  Medications  . carvedilol (COREG) 12.5 MG tablet    Sig: Take 1 tablet (12.5 mg total) by mouth 2 (two) times daily with a meal.    Dispense:  180 tablet    Refill:  3  . hydrALAZINE (APRESOLINE) 50 MG tablet    Sig: Take 1 tablet (50 mg total) by mouth 3 (three) times daily.    Dispense:  270 tablet    Refill:  3  . isosorbide mononitrate (IMDUR) 60 MG 24 hr tablet    Sig: Take 1 tablet (60 mg total) by mouth daily.    Dispense:  90 tablet    Refill:  3    Patient Instructions  Medication Instructions:  Please increase your Carvedilol to 12.5 mg twice a day. Continue all other medications as listed.  If you need a refill on your cardiac medications before your next appointment, please call your pharmacy.   Testing/Procedures: Your physician has requested that you have an echocardiogram. Echocardiography is a painless test  that uses sound waves to create images of your heart. It provides your doctor with information about the size and shape of your heart and how well your heart's chambers and valves are working. This procedure takes approximately one hour. There are no restrictions for this procedure.  Follow-Up: You w ill need a follow up appointment in 2 months with Truitt Merle, NP.   Thank you for choosing [facility]!!         Signed, Candee Furbish, MD  01/23/2018 9:13 AM    [place]

## 2018-02-09 DIAGNOSIS — N183 Chronic kidney disease, stage 3 (moderate): Secondary | ICD-10-CM | POA: Diagnosis not present

## 2018-03-23 ENCOUNTER — Ambulatory Visit (HOSPITAL_COMMUNITY): Payer: Medicare Other | Attending: Cardiology

## 2018-03-23 ENCOUNTER — Other Ambulatory Visit: Payer: Self-pay | Admitting: Cardiology

## 2018-03-23 ENCOUNTER — Other Ambulatory Visit: Payer: Self-pay

## 2018-03-23 DIAGNOSIS — I420 Dilated cardiomyopathy: Secondary | ICD-10-CM | POA: Diagnosis not present

## 2018-03-24 NOTE — Progress Notes (Signed)
Cardiology Office Note   Date:  03/25/2018   ID:  Daniel Obrien, DOB 17-Jun-1950, MRN [id number]  PCP:  [facility]  Cardiologist: Dr. Ena Dawley, MD   Chief Complaint  Patient presents with  . Hypertension  . Congestive Heart Failure  . Follow-up   History of Present Illness: Daniel Obrien is a 68 y.o. male who presents for 2 month follow up for dilated cardiomyopathy, seen for Dr. Meda Coffee  Daniel Obrien has a history of recently diagnosed dilated cardiomyopathy/combined systolic and diastolic heart failure.  Last seen by cardiology team on 01/23/2018 for follow-up of hypertensive urgency s/p hospitalization, discharged on 12/24/2017. LVEF at that time found to be 30 to 35% per echocardiogram.  His hospitalization was complicated by acute kidney injury.  At one point his diuretics were held secondary to acute renal dysfunction. Peak creatinine was 2.64 and 1.81 on discharge.  A renal ultrasound was performed which was negative for hydronephrosis. Was discharged on carvedilol 6.25 mg twice daily, hydralazine 50 mg 3 times daily, isosorbide mononitrate 60 mg and ASA.  It was recommeded that he follow with cardiology in the outpatient setting with repeat echocardiogram after optimal HF therapy.   During his last office visit, plans were made to see him back in the office for repeat echocardiogram.  If EF remained low and creatinine remained stable, plan is to pursue coronary angiography to exclude ischemic etiology of cardiomyopathy.  Carvedilol was increased to 12.5 mg twice daily at that time.  However, echocardiogram performed 03/23/2018 showed an LVEF of 50 to 55% with no wall motion abnormality and a grade 2 DD.  There was mild AR, mild MR with trivial TR and a peak PA pressure of 31 mmHg. Prior echocardiogram from 12/18/2017 with LVEF of 30% with diffuse hypokinesis.  Today, he is here and is feeling well. His son is with him to help with interpretation.  His LVEF has improved  and given the above results, no need to pursue further ischemic evaluation. He denies chest pain or other ACS symptoms. Denies SOB, orthopnea, LE swelling, recent illness, dizziness or syncope. Will continue current HF regimen, however will increase hydralazine given elevated BP today.    Past Medical History:  Diagnosis Date  . Acute CHF (congestive heart failure) ([place]) 12/17/2017  . ARF (acute renal failure) ([place]) 12/17/2017  . Cardiomyopathy ([place]), new, EF 30-35% 12/18/2017  . Chest pain 12/17/2017  . Demand ischemia ([place])   . Diabetes ([place])   . Elevated troponin 12/18/2017  . Hydroureteronephrosis, left, mild to moderate 12/18/2017  . Hypertension   . Hyperthyroidism 12/18/2017  . Penile pain 12/18/2017    Past Surgical History:  Procedure Laterality Date  . NOSE SURGERY      Current Outpatient Medications  Medication Sig Dispense Refill  . carvedilol (COREG) 12.5 MG tablet Take 1 tablet (12.5 mg total) by mouth 2 (two) times daily with a meal. 60 tablet 9  . hydrALAZINE (APRESOLINE) 100 MG tablet Take 1 tablet (100 mg total) by mouth 3 (three) times daily. 90 tablet 9  . isosorbide mononitrate (IMDUR) 60 MG 24 hr tablet Take 1 tablet (60 mg total) by mouth daily. 90 tablet 3   No current facility-administered medications for this visit.    Allergies:   Heparin   Social History:  The patient  reports that he has never smoked. He has never used smokeless tobacco. He reports that he does not drink alcohol or use drugs.   Family  History:  The patient's family history includes Unexplained death (age of onset: 33) in his father; Unexplained death (age of onset: 66) in his mother.    ROS:  Please see the history of present illness. Otherwise, review of systems are positive for none.   All other systems are reviewed and negative.    PHYSICAL EXAM: VS:  BP (!) 170/90 (BP Location: Left Arm, Patient Position: Sitting, Cuff Size: Normal)   Pulse 61   Ht 5\' 5"  (1.651 m)   Wt 184 lb 1.9  oz (83.5 kg)   SpO2 98% Comment: at rest  BMI 30.64 kg/m  , BMI Body mass index is 30.64 kg/m.   General: Well developed, well nourished, NAD Skin: Warm, dry, intact  Head: Normocephalic, atraumatic, clear, moist mucus membranes. Neck: Negative for carotid bruits. No JVD Lungs:Clear to ausculation bilaterally. No wheezes, rales, or rhonchi. Breathing is unlabored. Cardiovascular: RRR with S1 S2. No murmurs, rubs, gallops, or LV heave appreciated. MSK: Strength and tone appear normal for age. 5/5 in all extremities Extremities: No edema. No clubbing or cyanosis. DP/PT pulses 2+ bilaterally Neuro: Alert and oriented. No focal deficits. No facial asymmetry. MAE spontaneously. Psych: Responds to questions appropriately with normal affect.     EKG:  EKG is not ordered today.   Recent Labs: 12/17/2017: B Natriuretic Peptide 1,450.7; TSH 0.258 12/24/2017: ALT 45; BUN 37; Creatinine, Ser 1.81; Hemoglobin 14.7; Platelets 202; Potassium 4.4; Sodium 136    Lipid Panel No results found for: CHOL, TRIG, HDL, CHOLHDL, VLDL, LDLCALC, LDLDIRECT    Wt Readings from Last 3 Encounters:  03/25/18 184 lb 1.9 oz (83.5 kg)  01/23/18 182 lb (82.6 kg)  12/24/17 180 lb (81.6 kg)     Other studies Reviewed: Additional studies/ records that were reviewed today include:  Echocardiogram 03/23/2018: Study Conclusions  - Left ventricle: The cavity size was normal. Wall thickness was   increased in a pattern of severe LVH. Systolic function was   normal. The estimated ejection fraction was in the range of 50%   to 55%. Wall motion was normal; there were no regional wall   motion abnormalities. Features are consistent with a pseudonormal   left ventricular filling pattern, with concomitant abnormal   relaxation and increased filling pressure (grade 2 diastolic   dysfunction). - Aortic valve: There was mild regurgitation. - Aorta: Aortic root dimension: 38 mm (ED). Ascending aortic   diameter: 38 mm  (S). - Aortic root: The aortic root was mildly dilated. - Ascending aorta: The ascending aorta was mildly dilated. - Mitral valve: There was mild regurgitation. - Left atrium: The atrium was severely dilated. - Tricuspid valve: There was trivial regurgitation. - Pulmonary arteries: PA peak pressure: 31 mm Hg (S).  Echocardiogram 12/18/2017: Study Conclusions  - Left ventricle: LVEF is approximately 30% with diffuse   hypokinesis. The cavity size was severely dilated. Wall thickness   was normal. - Aortic valve: There was mild regurgitation. - Mitral valve: There was mild to moderate regurgitation. - Left atrium: The atrium was severely dilated. - Right ventricle: The cavity size was mildly dilated. Systolic   function was moderately reduced. - Right atrium: The atrium was mildly dilated.   ASSESSMENT AND PLAN:  1.  Dilated cardiomyopathy/acute systolic and diastolic heart failure: -Previous echocardiogram 12/18/17 with LVEF noted to be 30% with diffuse hypokinesis, likely HTN induced  -Pt placed on carvedilol 12.5 twice daily and hydralazine 50mg  daily, as well as IMDUR 60mg   -Repeat echo performed after several  months of therapy with low normalization of LV function to 50-55% with NWMA and G2DD -Weight, 187lb at last office visit 01/23/18>>> -Will check BMET today and is renal fucntion WNL, will continue with current regimen and follow in 2-3 months  -Continue low sodium diet, daily self weights  2. Essential HTN: -Elevated, 170/90 today  -Will increase hydralazine to 100mg  three time daily and have him follow in two weeks for RN BP check -Encouraged getting home BP monitor and check ion a regular basis     3. Hx of chest pain: -LVEF improved per echo with no c/o of chest pain  -No indication for further ischemic evaluation    4.  Acute kidney injury: -AKI during hospitalization with a peak creatinine  -Will check BMET to unsure normalization -Followed post  hospitalization by nephrology ans was released given adequate renal funciton however I do not see those results today   Current medicines are reviewed at length with the patient today.  The patient does not have concerns regarding medicines.  The following changes have been made: Increase hydralazine to 100mg  three times daily and follow with RN BP check in 2 weeks   Labs/ tests ordered today include:   Disposition:   FU with Dr. Meda Coffee in 2 months  Signed, Kathyrn Drown, NP  03/25/2018 9:49 AM    Daniel Obrien, [place], [place]  [address] Phone: [phone]; Fax: [phone]

## 2018-03-25 ENCOUNTER — Encounter: Payer: Self-pay | Admitting: Cardiology

## 2018-03-25 ENCOUNTER — Ambulatory Visit (INDEPENDENT_AMBULATORY_CARE_PROVIDER_SITE_OTHER): Payer: Medicare Other | Admitting: Cardiology

## 2018-03-25 VITALS — BP 170/90 | HR 61 | Ht 65.0 in | Wt 184.1 lb

## 2018-03-25 DIAGNOSIS — N179 Acute kidney failure, unspecified: Secondary | ICD-10-CM

## 2018-03-25 DIAGNOSIS — I10 Essential (primary) hypertension: Secondary | ICD-10-CM

## 2018-03-25 DIAGNOSIS — I5021 Acute systolic (congestive) heart failure: Secondary | ICD-10-CM

## 2018-03-25 DIAGNOSIS — I420 Dilated cardiomyopathy: Secondary | ICD-10-CM | POA: Diagnosis not present

## 2018-03-25 LAB — BASIC METABOLIC PANEL
BUN/Creatinine Ratio: 12 (ref 10–24)
BUN: 18 mg/dL (ref 8–27)
CO2: 23 mmol/L (ref 20–29)
Calcium: 9 mg/dL (ref 8.6–10.2)
Chloride: 101 mmol/L (ref 96–106)
Creatinine, Ser: 1.45 mg/dL — ABNORMAL HIGH (ref 0.76–1.27)
GFR calc Af Amer: 58 mL/min/{1.73_m2} — ABNORMAL LOW (ref >59)
GFR calc non Af Amer: 50 mL/min/{1.73_m2} — ABNORMAL LOW (ref >59)
Glucose: 98 mg/dL (ref 65–99)
Potassium: 3.6 mmol/L (ref 3.5–5.2)
Sodium: 138 mmol/L (ref 134–144)

## 2018-03-25 MED ORDER — HYDRALAZINE HCL 100 MG PO TABS: 100.0000 mg | ORAL_TABLET | Freq: Three times a day (TID) | ORAL | 9 refills | Status: DC

## 2018-03-25 MED ORDER — CARVEDILOL 12.5 MG PO TABS: 12.5000 mg | ORAL_TABLET | Freq: Two times a day (BID) | ORAL | 9 refills | Status: DC

## 2018-03-25 NOTE — Patient Instructions (Signed)
Medication Instructions:  Your physician has recommended you make the following change in your medication:  1. Increase hydralazine to one tablet (100 mg) three times a day.  Sent in today to requested pharmacy.     Labwork: Your physician recommends that you have lab work today: bmet   Testing/Procedures: -None  Follow-Up: Your physician recommends that you keep your scheduled  follow-up appointment for Blood Pressure Check.  Your physician recommends that you keep your scheduled  follow-up appointment with Dr. Meda Coffee.    Any Other Special Instructions Will Be Listed Below (If Applicable).     If you need a refill on your cardiac medications before your next appointment, please call your pharmacy.

## 2018-03-26 ENCOUNTER — Ambulatory Visit: Payer: Medicare Other | Admitting: Cardiology

## 2018-04-03 DIAGNOSIS — I5022 Chronic systolic (congestive) heart failure: Secondary | ICD-10-CM | POA: Diagnosis not present

## 2018-04-03 DIAGNOSIS — N183 Chronic kidney disease, stage 3 (moderate): Secondary | ICD-10-CM | POA: Diagnosis not present

## 2018-04-03 DIAGNOSIS — I10 Essential (primary) hypertension: Secondary | ICD-10-CM | POA: Diagnosis not present

## 2018-04-08 ENCOUNTER — Ambulatory Visit: Payer: Medicare Other

## 2018-04-09 ENCOUNTER — Ambulatory Visit (INDEPENDENT_AMBULATORY_CARE_PROVIDER_SITE_OTHER): Payer: Medicare Other | Admitting: Pharmacist

## 2018-04-09 ENCOUNTER — Encounter: Payer: Self-pay | Admitting: Pharmacist

## 2018-04-09 VITALS — BP 126/87 | HR 63

## 2018-04-09 DIAGNOSIS — I10 Essential (primary) hypertension: Secondary | ICD-10-CM | POA: Diagnosis not present

## 2018-04-09 NOTE — Progress Notes (Signed)
Patient ID: Daniel Obrien                 DOB: Aug 11, 1950                      MRN: [id number]     HPI: Daniel Obrien is a 68 y.o. male patient of Dr Thera Flake referred by Kathyrn Drown, NP to HTN clinic. PMH is significant for dilated cardiomyopathy/combined systolic and diastolic heart failure, HTN and DM. Patient was hospitalized 12/24/17 for hypertensive urgency and AKI. Repeat echo 1/6 showed improvement of EF to 50-55%. At last visit 03/25/2018 hydralazine was increased to 100mg  three times a day due to BP of 170/90 HR 61 in clinic.  Patient presents today with his son who interprets. According to son, patient was seen by PCP last week and started on furosemide 20mg  daily due to elevated blood pressure. Denies any swelling/shortness of breath prior to initiation.   Patient states that he has not taken furosemide today yet, but did take it all the other days. States that after he took his second hydralazine today he has been feeling a little dizzy and his vision is blurry. This was the only occurrence.   When checking his blood pressure manually, his heart beat was very soft- automatic cuff had to be used for verification.    Current HTN meds: carvedilol 12.5mg  twice a day, hydralazine 100mg  three times a day, Imdur 60mg  daily, furosemide 20mg  daily Previously tried:  BP goal: <130/80  Family History: The patient's family history includes Unexplained death (age of onset: 12) in his father; Unexplained death (age of onset: 65) in his mother.   Social History: The patient  reports that he has never smoked. He has never used smokeless tobacco. He reports that he does not drink alcohol or use drugs.   Diet: no caffeine, doesn't cook with salt. Cut back on hot dogs. Doesn't use a lof sauces  Exercise: 30 min walk most days of week  Home BP readings: is not checking  Wt Readings from Last 3 Encounters:  03/25/18 184 lb 1.9 oz (83.5 kg)  01/23/18 182 lb (82.6 kg)  12/24/17 180 lb (81.6 kg)   BP  Readings from Last 3 Encounters:  03/25/18 (!) 170/90  01/23/18 140/90  12/24/17 (!) 162/91   Pulse Readings from Last 3 Encounters:  03/25/18 61  01/23/18 66  12/24/17 72    Renal function: CrCl cannot be calculated (Unknown ideal weight.).  Past Medical History:  Diagnosis Date  . Acute CHF (congestive heart failure) ([place]) 12/17/2017  . ARF (acute renal failure) ([place]) 12/17/2017  . Cardiomyopathy ([place]), new, EF 30-35% 12/18/2017  . Chest pain 12/17/2017  . Demand ischemia ([place])   . Diabetes ([place])   . Elevated troponin 12/18/2017  . Hydroureteronephrosis, left, mild to moderate 12/18/2017  . Hypertension   . Hyperthyroidism 12/18/2017  . Penile pain 12/18/2017    Current Outpatient Medications on File Prior to Visit  Medication Sig Dispense Refill  . carvedilol (COREG) 12.5 MG tablet Take 1 tablet (12.5 mg total) by mouth 2 (two) times daily with a meal. 60 tablet 9  . hydrALAZINE (APRESOLINE) 100 MG tablet Take 1 tablet (100 mg total) by mouth 3 (three) times daily. 90 tablet 9  . isosorbide mononitrate (IMDUR) 60 MG 24 hr tablet Take 1 tablet (60 mg total) by mouth daily. 90 tablet 3   No current facility-administered medications on file prior to visit.     Allergies  Allergen Reactions  . Heparin Shortness Of Breath    Patient family stated when heparin is given patient starts shaking, wheezing. [facility] notified.    There were no vitals taken for this visit.   Assessment/Plan:  1. Hypertension - patient blood pressure was close to goal of <130/80. I will discontinue furosemide due to the fact that patient was not fluid overloaded and there are other medications less likely to affect his kidney function and better at lowering blood pressure. His dizziness could be from over diuresis. Will check a BMP today.  I have asked patient to monitor his blood pressure at home 1-2 times a day record those readings. I have also asked him to bring those readings and his meter  with him to his next appointment.  Suggested he drink a little extra water today and call us if the dizziness doesn't improve. Will recheck blood pressure in about 3 weeks.   Thank you  Ramond Dial, Pharm.D, [place]  [address], [place], [place] [address]  Phone: [phone]; Fax: [phone]

## 2018-04-09 NOTE — Patient Instructions (Signed)
Stop taking furosemide.  Check your blood pressure at home once to twice a day. Record those readings and bring them with you to your next appointment along with your blood pressure meter.  If dizziness does not improve with stopping furosemide, give [place] a call.  Call us at [phone] with any questions or concerns.

## 2018-04-10 ENCOUNTER — Telehealth: Payer: Self-pay | Admitting: Pharmacist

## 2018-04-10 LAB — BASIC METABOLIC PANEL
BUN/Creatinine Ratio: 20 (ref 10–24)
BUN: 37 mg/dL — ABNORMAL HIGH (ref 8–27)
CO2: 26 mmol/L (ref 20–29)
Calcium: 9.5 mg/dL (ref 8.6–10.2)
Chloride: 98 mmol/L (ref 96–106)
Creatinine, Ser: 1.82 mg/dL — ABNORMAL HIGH (ref 0.76–1.27)
GFR calc Af Amer: 44 mL/min/{1.73_m2} — ABNORMAL LOW (ref >59)
GFR calc non Af Amer: 38 mL/min/{1.73_m2} — ABNORMAL LOW (ref >59)
Glucose: 103 mg/dL — ABNORMAL HIGH (ref 65–99)
Potassium: 4.6 mmol/L (ref 3.5–5.2)
Sodium: 140 mmol/L (ref 134–144)

## 2018-04-10 NOTE — Telephone Encounter (Signed)
Called and spoke with patient son about lab results. encouraged him to drink more water for the next several days and to make sure he doesn't take any more furosemide. Will recheck BMP prior to appointment to 2/17.  Also confirmed patient speaks [place] and [place] - will get translator for next appointment

## 2018-04-17 DIAGNOSIS — Z23 Encounter for immunization: Secondary | ICD-10-CM | POA: Diagnosis not present

## 2018-04-17 DIAGNOSIS — N183 Chronic kidney disease, stage 3 (moderate): Secondary | ICD-10-CM | POA: Diagnosis not present

## 2018-04-17 DIAGNOSIS — I5022 Chronic systolic (congestive) heart failure: Secondary | ICD-10-CM | POA: Diagnosis not present

## 2018-04-17 DIAGNOSIS — I10 Essential (primary) hypertension: Secondary | ICD-10-CM | POA: Diagnosis not present

## 2018-05-04 ENCOUNTER — Ambulatory Visit (INDEPENDENT_AMBULATORY_CARE_PROVIDER_SITE_OTHER): Payer: Medicare Other

## 2018-05-04 DIAGNOSIS — I10 Essential (primary) hypertension: Secondary | ICD-10-CM | POA: Diagnosis not present

## 2018-05-04 NOTE — Progress Notes (Signed)
Patient ID: Alban Marucci                 DOB: 1951/02/11                      MRN: [id number]     HPI: Oleg Oleson is a 68 y.o. male patient of Dr Meda Coffee referred by Kathyrn Drown, NP to HTN clinic. PMH is significant for dilated cardiomyopathy/combined systolic anddiastolic heart failure, HTN and DM. Patient was hospitalized 12/24/17 for hypertensive urgency and AKI. Repeat echo 1/6 showed improvement of EF to 50-55%. At last visit 04/09/2018, Lasix was discontinued due to patient reporting dizziness.  Patient presents with his son and an interpreter. Patient reports that overall he feels well. He does report that his vision is still blurred and does get occasional headaches, but denies any dizziness. He reports that he did stop lasix as directed. Pt has been checking his BP at home with all readings at goal.  Current HTN meds: carvedilol 12.5mg  twice a day, hydralazine 100mg  three times a day, Imdur 60mg  daily Previously tried: furosemide 20 daily BP goal: <130/80  Family History: Unexplained death (age of onset: 73) in his father; Unexplained death (age of onset: 63) in his mother.  Social History: The patientreports that he has never smoked. He has never used smokeless tobacco. He reports that he does not drink alcohol or use drugs.  Diet: no caffeine, doesn't cook with salt. Does not eat any fast food.  Exercise: does some sort of physical exercise most days of the week, but hasn't been walking recently like he used to due to the weather  Home BP readings: Highest: 131/82, lowest 106/57, average 110-120s/60-70s  Wt Readings from Last 3 Encounters:  03/25/18 184 lb 1.9 oz (83.5 kg)  01/23/18 182 lb (82.6 kg)  12/24/17 180 lb (81.6 kg)   BP Readings from Last 3 Encounters:  05/04/18 120/70  04/09/18 126/87  03/25/18 (!) 170/90   Pulse Readings from Last 3 Encounters:  05/04/18 61  04/09/18 63  03/25/18 61    Renal function: CrCl cannot be calculated (Patient's most recent lab  result is older than the maximum 21 days allowed.).  Past Medical History:  Diagnosis Date  . Acute CHF (congestive heart failure) ([place]) 12/17/2017  . ARF (acute renal failure) ([place]) 12/17/2017  . Cardiomyopathy ([place]), new, EF 30-35% 12/18/2017  . Chest pain 12/17/2017  . Demand ischemia ([place])   . Diabetes ([place])   . Elevated troponin 12/18/2017  . Hydroureteronephrosis, left, mild to moderate 12/18/2017  . Hypertension   . Hyperthyroidism 12/18/2017  . Penile pain 12/18/2017    Current Outpatient Medications on File Prior to Visit  Medication Sig Dispense Refill  . carvedilol (COREG) 12.5 MG tablet Take 1 tablet (12.5 mg total) by mouth 2 (two) times daily with a meal. 60 tablet 9  . hydrALAZINE (APRESOLINE) 100 MG tablet Take 1 tablet (100 mg total) by mouth 3 (three) times daily. 90 tablet 9  . isosorbide mononitrate (IMDUR) 60 MG 24 hr tablet Take 1 tablet (60 mg total) by mouth daily. 90 tablet 3   No current facility-administered medications on file prior to visit.     Allergies  Allergen Reactions  . Heparin Shortness Of Breath    Patient family stated when heparin is given patient starts shaking, wheezing. [facility] notified.     Assessment/Plan:  1. Hypertension - Both patient's clinic and at home blood pressure readings are at goal of <  130/80. Continue current BP regimen including carvedilol 12.5mg  twice a day, hydralazine 100mg  three times a day, Imdur 60mg  daily. Encouraged patient to continue taking his blood pressure 1-2 times each day and recording his readings. F/u in HTN clinic as needed.  Jackson Latino, PharmD PGY1 Pharmacy Resident Phone [phone] 05/04/2018     1:44 PM

## 2018-06-08 ENCOUNTER — Telehealth: Payer: Self-pay | Admitting: Cardiology

## 2018-06-08 NOTE — Telephone Encounter (Signed)
I have reviewed patients chart and would like to arrange a televisit on 3/30. We will need an interpreter for this visit.  Thank you,  Ena Dawley

## 2018-06-11 NOTE — Telephone Encounter (Signed)
Left several messages for the pt to call the office back to discuss setting him up for a virtual office visit with Dr Meda Coffee on next Monday 3/30, as scheduled for 0940.

## 2018-06-12 NOTE — Telephone Encounter (Addendum)
Pt never called the office back so we can discuss setting him up for a telehealth visit with Dr Meda Coffee, as scheduled for Monday 06/15/18 at [facility]. Per Dr Meda Coffee, she would like for this pts appt to be cancelled and to be prioritized for follow-up in 4-6 weeks.  Will send this to the covid cancel pool for priority #1.

## 2018-06-15 ENCOUNTER — Telehealth: Payer: Medicare Other | Admitting: Cardiology

## 2018-06-25 NOTE — Telephone Encounter (Signed)
Call placed to pt re: appt that was cancelled with Dr. Meda Coffee to see if we could get pt set up for a virtual visit, No answer  / no machine.

## 2018-07-02 NOTE — Telephone Encounter (Signed)
2nd attempt to reach pt to set up virtual visit. No answer / no machine.

## 2018-07-06 NOTE — Telephone Encounter (Signed)
Call placed to pt to reschedule appt that was cancelled due to Covid 19. No answer / no machine. Will try again later.

## 2018-08-24 ENCOUNTER — Emergency Department (HOSPITAL_COMMUNITY)
Admission: EM | Admit: 2018-08-24 | Discharge: 2018-08-24 | Disposition: A | Discharge status: Home / Self Care | Payer: Medicare Other | Attending: Emergency Medicine | Admitting: Emergency Medicine

## 2018-08-24 ENCOUNTER — Other Ambulatory Visit: Payer: Self-pay

## 2018-08-24 ENCOUNTER — Encounter (HOSPITAL_COMMUNITY): Payer: Self-pay | Admitting: *Deleted

## 2018-08-24 ENCOUNTER — Emergency Department (HOSPITAL_COMMUNITY): Payer: Medicare Other

## 2018-08-24 DIAGNOSIS — B9789 Other viral agents as the cause of diseases classified elsewhere: Secondary | ICD-10-CM

## 2018-08-24 DIAGNOSIS — J988 Other specified respiratory disorders: Secondary | ICD-10-CM | POA: Diagnosis not present

## 2018-08-24 DIAGNOSIS — E079 Disorder of thyroid, unspecified: Secondary | ICD-10-CM | POA: Diagnosis not present

## 2018-08-24 DIAGNOSIS — U071 COVID-19: Secondary | ICD-10-CM | POA: Insufficient documentation

## 2018-08-24 DIAGNOSIS — I509 Heart failure, unspecified: Secondary | ICD-10-CM | POA: Insufficient documentation

## 2018-08-24 DIAGNOSIS — I110 Hypertensive heart disease with heart failure: Secondary | ICD-10-CM | POA: Diagnosis not present

## 2018-08-24 DIAGNOSIS — I10 Essential (primary) hypertension: Secondary | ICD-10-CM | POA: Diagnosis not present

## 2018-08-24 DIAGNOSIS — R05 Cough: Secondary | ICD-10-CM | POA: Diagnosis not present

## 2018-08-24 DIAGNOSIS — M7918 Myalgia, other site: Secondary | ICD-10-CM | POA: Diagnosis not present

## 2018-08-24 DIAGNOSIS — R509 Fever, unspecified: Secondary | ICD-10-CM | POA: Diagnosis present

## 2018-08-24 LAB — URINALYSIS, ROUTINE W REFLEX MICROSCOPIC
Bacteria, UA: NONE SEEN
Bilirubin Urine: NEGATIVE
Glucose, UA: NEGATIVE mg/dL
Ketones, ur: 20 mg/dL — AB
Leukocytes,Ua: NEGATIVE
Nitrite: NEGATIVE
Protein, ur: 30 mg/dL — AB
Specific Gravity, Urine: 1.019 (ref 1.005–1.030)
pH: 5 (ref 5.0–8.0)

## 2018-08-24 LAB — SARS CORONAVIRUS 2 BY RT PCR (HOSPITAL ORDER, PERFORMED IN ~~LOC~~ HOSPITAL LAB): SARS Coronavirus 2: POSITIVE — AB

## 2018-08-24 MED ORDER — ACETAMINOPHEN 325 MG PO TABS
650.0000 mg | ORAL_TABLET | Freq: Once | ORAL | Status: AC
  Administered 2018-08-24: 650 mg via ORAL
  Filled 2018-08-24: qty 2

## 2018-08-24 NOTE — ED Triage Notes (Signed)
Pt in c/o fever, cough and body aches for the last few days, family reports pt denies shortness of breath, no distress noted

## 2018-08-24 NOTE — ED Provider Notes (Signed)
[facility] EMERGENCY DEPARTMENT Provider Note   CSN: [id number] Arrival date & time: 08/24/18  1655    History   Chief Complaint Chief Complaint  Patient presents with  . Fever  . Cough    HPI Daniel Obrien is a 68 y.o. male.     HPI   68 year old male presents today with complaints of fever and cough.  Patient son is at bedside who provides translation as he speaks [employer].  He notes a 2-day history of dry nonproductive cough and body aches.  He notes he feels feverish.  He denies any chest pain or shortness of breath.  He denies any abdominal pain dysuria, rashes, or any other complaints.  No rhinorrhea nasal congestion or sore throat.  Denies any close sick contacts.  He does note that his wife was ill several weeks ago.  He notes a history of hypertension.   Past Medical History:  Diagnosis Date  . Acute CHF (congestive heart failure) ([place]) 12/17/2017  . ARF (acute renal failure) ([place]) 12/17/2017  . Cardiomyopathy ([place]), new, EF 30-35% 12/18/2017  . Chest pain 12/17/2017  . Demand ischemia ([place])   . Diabetes ([place])   . Elevated troponin 12/18/2017  . Hydroureteronephrosis, left, mild to moderate 12/18/2017  . Hypertension   . Hyperthyroidism 12/18/2017  . Penile pain 12/18/2017    Patient Active Problem List   Diagnosis Date Noted  . Hypertension 05/04/2018  . Hydroureteronephrosis, left, mild to moderate 12/18/2017  . Elevated troponin 12/18/2017  . Cardiomyopathy ([place]), new, EF 30-35% 12/18/2017  . Penile pain 12/18/2017  . Hyperthyroidism 12/18/2017  . Demand ischemia ([place])   . Hypertensive emergency 12/17/2017  . Acute CHF (congestive heart failure) ([place]) 12/17/2017  . Chest pain 12/17/2017  . ARF (acute renal failure) ([place]) 12/17/2017    Past Surgical History:  Procedure Laterality Date  . NOSE SURGERY        Home Medications    Prior to Admission medications   Medication Sig Start Date End Date Taking? Authorizing Provider   carvedilol (COREG) 12.5 MG tablet Take 1 tablet (12.5 mg total) by mouth 2 (two) times daily with a meal. 03/25/18   Burtis Junes, NP  hydrALAZINE (APRESOLINE) 100 MG tablet Take 1 tablet (100 mg total) by mouth 3 (three) times daily. 03/25/18   Burtis Junes, NP  isosorbide mononitrate (IMDUR) 60 MG 24 hr tablet Take 1 tablet (60 mg total) by mouth daily. 01/23/18   Jerline Pain, MD   Family History Family History  Problem Relation Age of Onset  . Unexplained death Mother 54  . Unexplained death Father 62  . Hypertension Neg Hx   . Heart disease Neg Hx     Social History Social History   Tobacco Use  . Smoking status: Never Smoker  . Smokeless tobacco: Never Used  Substance Use Topics  . Alcohol use: Never    Frequency: Never  . Drug use: Never     Allergies   Heparin   Review of Systems Review of Systems  All other systems reviewed and are negative.  Physical Exam Updated Vital Signs BP (!) 158/92   Pulse 68   Temp (!) 102.2 F (39 C) (Oral)   Resp (!) 22   SpO2 96%   Physical Exam Vitals signs and nursing note reviewed.  Constitutional:      Appearance: He is well-developed.  HENT:     Head: Normocephalic and atraumatic.     Comments: Oropharynx clear,  no erythema edema or exudate Eyes:     General: No scleral icterus.       Right eye: No discharge.        Left eye: No discharge.     Conjunctiva/sclera: Conjunctivae normal.     Pupils: Pupils are equal, round, and reactive to light.  Neck:     Musculoskeletal: Normal range of motion.     Vascular: No JVD.     Trachea: No tracheal deviation.  Pulmonary:     Effort: Pulmonary effort is normal. No respiratory distress.     Breath sounds: No stridor. No wheezing or rales.  Neurological:     Mental Status: He is alert and oriented to person, place, and time.     Coordination: Coordination normal.  Psychiatric:        Behavior: Behavior normal.        Thought Content: Thought content normal.         Judgment: Judgment normal.    ED Treatments / Results  Labs (all labs ordered are listed, but only abnormal results are displayed) Labs Reviewed  URINALYSIS, ROUTINE W REFLEX MICROSCOPIC - Abnormal; Notable for the following components:      Result Value   Hgb urine dipstick SMALL (*)    Ketones, ur 20 (*)    Protein, ur 30 (*)    All other components within normal limits  SARS CORONAVIRUS 2 (HOSPITAL ORDER, [facility])    EKG None  Radiology Dg Chest Portable 1 View  Result Date: 08/24/2018 CLINICAL DATA:  Fever, cough, body aches. EXAM: PORTABLE CHEST 1 VIEW COMPARISON:  12/21/2017 FINDINGS: Apically lordotic projection. Borderline enlargement of the cardiopericardial silhouette. No edema. The lungs appear clear. No blunting of the costophrenic angles. No significant skeletal abnormality is observed. IMPRESSION: 1. Borderline enlargement of the cardiopericardial silhouette. Otherwise, no significant abnormalities are observed. Electronically Signed   By: Van Clines M.D.   On: 08/24/2018 19:28    Procedures Procedures (including critical care time)  Medications Ordered in ED Medications  acetaminophen (TYLENOL) tablet 650 mg (650 mg Oral Given 08/24/18 2014)     Initial Impression / Assessment and Plan / ED Course  I have reviewed the triage vital signs and the nursing notes.  Pertinent labs & imaging results that were available during my care of the patient were reviewed by me and considered in my medical decision making (see chart for details).        Labs: COVID-19  Imaging: Portable chest  Consults:  Therapeutics: Tylenol  Discharge Meds:   Assessment/Plan: 68 year old male presents today with likely viral illness.  He has a nonproductive cough clear lung sounds negative chest x-ray.  He is tachycardic here.  His oxygen is 95% on room air, he has no acute shortness of breath.  COVID testing sent.  Patient given Tylenol.   Given his overall well appearance I do find he will be stable for outpatient management.  He lives with his son, they will return immediately develops any new or worsening signs or symptoms and follow-up with primary care for symptoms persist.  He verbalized understanding and agreement to today's plan had no further questions or concerns.   Final Clinical Impressions(s) / ED Diagnoses   Final diagnoses:  Viral respiratory infection    ED Discharge Orders    None       Francee Gentile 08/24/18 2040    Julianne Rice, MD 08/29/18 [phone]

## 2018-08-24 NOTE — Discharge Instructions (Addendum)
Please read attached information. If you experience any new or worsening signs or symptoms please return to the emergency room for evaluation. Please follow-up with your primary care provider or specialist as discussed. Please use medication prescribed only as directed and discontinue taking if you have any concerning signs or symptoms.   °

## 2018-08-24 NOTE — ED Notes (Signed)
Patient verbalizes understanding of discharge instructions. Opportunity for questioning and answers were provided. Armband removed by staff, pt discharged from ED.  

## 2018-08-25 DIAGNOSIS — U071 COVID-19: Secondary | ICD-10-CM | POA: Diagnosis not present

## 2018-08-25 DIAGNOSIS — I5022 Chronic systolic (congestive) heart failure: Secondary | ICD-10-CM | POA: Diagnosis not present

## 2018-10-20 DIAGNOSIS — N183 Chronic kidney disease, stage 3 (moderate): Secondary | ICD-10-CM | POA: Diagnosis not present

## 2018-10-20 DIAGNOSIS — Z23 Encounter for immunization: Secondary | ICD-10-CM | POA: Diagnosis not present

## 2018-10-20 DIAGNOSIS — Z87891 Personal history of nicotine dependence: Secondary | ICD-10-CM | POA: Diagnosis not present

## 2018-10-20 DIAGNOSIS — Z0000 Encounter for general adult medical examination without abnormal findings: Secondary | ICD-10-CM | POA: Diagnosis not present

## 2018-10-20 DIAGNOSIS — Z1211 Encounter for screening for malignant neoplasm of colon: Secondary | ICD-10-CM | POA: Diagnosis not present

## 2018-10-20 DIAGNOSIS — Z136 Encounter for screening for cardiovascular disorders: Secondary | ICD-10-CM | POA: Diagnosis not present

## 2018-10-20 DIAGNOSIS — Z125 Encounter for screening for malignant neoplasm of prostate: Secondary | ICD-10-CM | POA: Diagnosis not present

## 2018-10-20 DIAGNOSIS — I5022 Chronic systolic (congestive) heart failure: Secondary | ICD-10-CM | POA: Diagnosis not present

## 2018-10-20 DIAGNOSIS — I10 Essential (primary) hypertension: Secondary | ICD-10-CM | POA: Diagnosis not present

## 2018-10-22 ENCOUNTER — Other Ambulatory Visit: Payer: Self-pay | Admitting: Family Medicine

## 2018-10-22 DIAGNOSIS — Z136 Encounter for screening for cardiovascular disorders: Secondary | ICD-10-CM

## 2018-10-22 DIAGNOSIS — Z87891 Personal history of nicotine dependence: Secondary | ICD-10-CM

## 2019-01-13 DIAGNOSIS — Z1211 Encounter for screening for malignant neoplasm of colon: Secondary | ICD-10-CM | POA: Diagnosis not present

## 2019-01-20 ENCOUNTER — Ambulatory Visit
Admission: RE | Admit: 2019-01-20 | Discharge: 2019-01-20 | Disposition: A | Discharge status: Home / Self Care | Payer: Medicare Other | Source: Ambulatory Visit | Attending: Family Medicine | Admitting: Family Medicine

## 2019-01-20 DIAGNOSIS — Z87891 Personal history of nicotine dependence: Secondary | ICD-10-CM

## 2019-01-20 DIAGNOSIS — Z136 Encounter for screening for cardiovascular disorders: Secondary | ICD-10-CM

## 2019-02-07 ENCOUNTER — Other Ambulatory Visit: Payer: Self-pay | Admitting: Nurse Practitioner

## 2019-02-07 ENCOUNTER — Other Ambulatory Visit: Payer: Self-pay | Admitting: Cardiology

## 2019-04-10 ENCOUNTER — Other Ambulatory Visit: Payer: Self-pay | Admitting: Nurse Practitioner

## 2019-04-23 DIAGNOSIS — I5022 Chronic systolic (congestive) heart failure: Secondary | ICD-10-CM | POA: Diagnosis not present

## 2019-04-23 DIAGNOSIS — I10 Essential (primary) hypertension: Secondary | ICD-10-CM | POA: Diagnosis not present

## 2019-04-23 DIAGNOSIS — N1832 Chronic kidney disease, stage 3b: Secondary | ICD-10-CM | POA: Diagnosis not present

## 2019-04-26 ENCOUNTER — Other Ambulatory Visit: Payer: Self-pay | Admitting: Family Medicine

## 2019-04-26 ENCOUNTER — Ambulatory Visit
Admission: RE | Admit: 2019-04-26 | Discharge: 2019-04-26 | Disposition: A | Discharge status: Home / Self Care | Payer: Medicare Other | Source: Ambulatory Visit | Attending: Family Medicine | Admitting: Family Medicine

## 2019-04-26 ENCOUNTER — Other Ambulatory Visit: Payer: Self-pay

## 2019-04-26 DIAGNOSIS — I5022 Chronic systolic (congestive) heart failure: Secondary | ICD-10-CM

## 2019-04-26 DIAGNOSIS — R0602 Shortness of breath: Secondary | ICD-10-CM | POA: Diagnosis not present

## 2019-05-05 ENCOUNTER — Encounter: Payer: Self-pay | Admitting: Family Medicine

## 2019-05-11 DIAGNOSIS — I5022 Chronic systolic (congestive) heart failure: Secondary | ICD-10-CM | POA: Diagnosis not present

## 2019-05-11 DIAGNOSIS — I10 Essential (primary) hypertension: Secondary | ICD-10-CM | POA: Diagnosis not present

## 2019-05-11 DIAGNOSIS — Z23 Encounter for immunization: Secondary | ICD-10-CM | POA: Diagnosis not present

## 2019-05-11 DIAGNOSIS — M545 Low back pain: Secondary | ICD-10-CM | POA: Diagnosis not present

## 2019-05-11 DIAGNOSIS — R635 Abnormal weight gain: Secondary | ICD-10-CM | POA: Diagnosis not present

## 2019-05-11 DIAGNOSIS — R5381 Other malaise: Secondary | ICD-10-CM | POA: Diagnosis not present

## 2019-05-11 DIAGNOSIS — N1832 Chronic kidney disease, stage 3b: Secondary | ICD-10-CM | POA: Diagnosis not present

## 2019-06-02 NOTE — Progress Notes (Signed)
Cardiology Office Note    Date:  06/08/2019   ID:  Daniel Obrien, DOB 24-Apr-1950, MRN [id number]  PCP:  [facility]  Cardiologist: Ena Dawley, MD EPS: None  Chief Complaint  Patient presents with  . Follow-up    History of Present Illness:  Daniel Obrien is a 69 y.o. male  Was diagnosed dilated cardiomyopathy/combined systolic and diastolic heart failure in the setting of hypertensive urgency in 2019 echo LVEF 30 to 35%.  Also had acute kidney injury at that time which resolved.  Follow-up echo 03/23/2018 LVEF improved to 50 to 55% with no wall motion abnormality and grade 2 DD with mild MR AI.  Last seen in our hypertension clinic 05/04/2018 and blood pressure was at goal.  Patient comes in today accompanied by interpreter and his son. Complains of dyspnea on exertion when the weather is cold. Does exercises at home and walks some 5-10 min. BP up today. They have a BP cuff at home but they don't check it often.Wife cooks with a lot of salt. Meds changed recently by PCP but son doesn't know what he's taking but we called the pharmacy to verify and Lasix, Diovan and lipitor added.   Denies chest pain, palpitations, dizziness or edema.  Past Medical History:  Diagnosis Date  . Acute CHF (congestive heart failure) ([place]) 12/17/2017  . ARF (acute renal failure) ([place]) 12/17/2017  . Cardiomyopathy ([place]), new, EF 30-35% 12/18/2017  . Chest pain 12/17/2017  . Demand ischemia ([place])   . Diabetes ([place])   . Elevated troponin 12/18/2017  . Hydroureteronephrosis, left, mild to moderate 12/18/2017  . Hypertension   . Hyperthyroidism 12/18/2017  . Penile pain 12/18/2017    Past Surgical History:  Procedure Laterality Date  . NOSE SURGERY      Current Medications: Current Meds  Medication Sig  . atorvastatin (LIPITOR) 10 MG tablet Take 10 mg by mouth daily.  . carvedilol (COREG) 12.5 MG tablet Take 1 tablet (12.5 mg total) by mouth 2 (two) times daily with a meal. Please make  yearly appt with Dr. Meda Coffee for January before anymore refills. 1st attempt  . furosemide (LASIX) 20 MG tablet Take 20 mg by mouth daily.  . hydrALAZINE (APRESOLINE) 100 MG tablet TAKE 1 TABLET BY MOUTH THREE TIMES DAILY  . isosorbide mononitrate (IMDUR) 60 MG 24 hr tablet Take 1 tablet (60 mg total) by mouth daily. Please make yearly appt with Dr. Meda Coffee for January before anymore refills. 1st attempt  . valsartan (DIOVAN) 160 MG tablet Take 1 tablet (160 mg total) by mouth daily.  . [DISCONTINUED] valsartan (DIOVAN) 80 MG tablet Take 80 mg by mouth daily.     Allergies:   Heparin   Social History   Socioeconomic History  . Marital status: Married    Spouse name: Not on file  . Number of children: Not on file  . Years of education: Not on file  . Highest education level: Not on file  Occupational History  . Occupation: Retired  Tobacco Use  . Smoking status: Never Smoker  . Smokeless tobacco: Never Used  Substance and Sexual Activity  . Alcohol use: Never  . Drug use: Never  . Sexual activity: Not on file  Other Topics Concern  . Not on file  Social History Narrative   Pt and wife are [place]. Wife speaks a little [place] and [place], a little [place], mostly [place].   Husband speaks more [place] and [place].   Social Determinants  of Health   Financial Resource Strain:   . Difficulty of Paying Living Expenses:   Food Insecurity:   . Worried About Charity fundraiser in the Last Year:   . Arboriculturist in the Last Year:   Transportation Needs:   . Film/video editor (Medical):   Marland Kitchen Lack of Transportation (Non-Medical):   Physical Activity:   . Days of Exercise per Week:   . Minutes of Exercise per Session:   Stress:   . Feeling of Stress :   Social Connections:   . Frequency of Communication with Friends and Family:   . Frequency of Social Gatherings with Friends and Family:   . Attends Religious Services:   . Active Member of Clubs or  Organizations:   . Attends Archivist Meetings:   Marland Kitchen Marital Status:      Family History:  The patient's family history includes Unexplained death (age of onset: 64) in his father; Unexplained death (age of onset: 56) in his mother.   ROS:   Please see the history of present illness.    ROS All other systems reviewed and are negative.   PHYSICAL EXAM:   VS:  BP (!) 150/80   Pulse 60   Ht 5\' 7"  (1.702 m)   Wt 188 lb 3.2 oz (85.4 kg)   SpO2 98%   BMI 29.48 kg/m   Physical Exam  GEN: Well nourished, well developed, in no acute distress  Neck: no JVD, carotid bruits, or masses Cardiac:RRR; no murmurs, rubs, or gallops  Respiratory:  clear to auscultation bilaterally, normal work of breathing GI: soft, nontender, nondistended, + BS Ext: without cyanosis, clubbing, or edema, Good distal pulses bilaterally Neuro:  Alert and Oriented x 3 Psych: euthymic mood, full affect  Wt Readings from Last 3 Encounters:  06/08/19 188 lb 3.2 oz (85.4 kg)  03/25/18 184 lb 1.9 oz (83.5 kg)  01/23/18 182 lb (82.6 kg)      Studies/Labs Reviewed:   EKG:  EKG is  ordered today.  The ekg ordered today demonstrates normal sinus rhythm with bigeminy, nonspecific ST-T wave changes, bigeminy is new from last EKG tracing  Recent Labs: No results found for requested labs within last 8760 hours.   Lipid Panel No results found for: CHOL, TRIG, HDL, CHOLHDL, VLDL, LDLCALC, LDLDIRECT  Additional studies/ records that were reviewed today include:    Echocardiogram 03/23/2018: Study Conclusions   - Left ventricle: The cavity size was normal. Wall thickness was   increased in a pattern of severe LVH. Systolic function was   normal. The estimated ejection fraction was in the range of 50%   to 55%. Wall motion was normal; there were no regional wall   motion abnormalities. Features are consistent with a pseudonormal   left ventricular filling pattern, with concomitant abnormal   relaxation and  increased filling pressure (grade 2 diastolic   dysfunction). - Aortic valve: There was mild regurgitation. - Aorta: Aortic root dimension: 38 mm (ED). Ascending aortic   diameter: 38 mm (S). - Aortic root: The aortic root was mildly dilated. - Ascending aorta: The ascending aorta was mildly dilated. - Mitral valve: There was mild regurgitation. - Left atrium: The atrium was severely dilated. - Tricuspid valve: There was trivial regurgitation. - Pulmonary arteries: PA peak pressure: 31 mm Hg (S).   Echocardiogram 12/18/2017: Study Conclusions   - Left ventricle: LVEF is approximately 30% with diffuse   hypokinesis. The cavity size was severely dilated.  Wall thickness   was normal. - Aortic valve: There was mild regurgitation. - Mitral valve: There was mild to moderate regurgitation. - Left atrium: The atrium was severely dilated. - Right ventricle: The cavity size was mildly dilated. Systolic   function was moderately reduced. - Right atrium: The atrium was mildly dilated.      ASSESSMENT:    1. NICM (nonischemic cardiomyopathy) ([place])   2. Essential hypertension   3. Hyperlipidemia, unspecified hyperlipidemia type   4. PVC's (premature ventricular contractions)      PLAN:  In order of problems listed above:  Dilated cardiomyopathy with acute systolic and diastolic CHF in the setting of hypertensive urgency 2019.  LVEF 30% improved to 50 to 55% with grade 2 DD on echo 03/2018-no heart failure on exam.  He complains of dyspnea on exertion in cold weather but this has been stable.  Essential hypertension was last seen in our hypertension clinic 05/04/2018 and blood pressure was at goal on carvedilol 12.5 mg twice daily, hydralazine 100 mg 3 times daily and Imdur 60 mg daily.  Blood pressure running high today.  Diovan and Lasix were added to his medications.  His wife does cook with a low salt according to his son.  We will increase Diovan to 160 mg daily.  bmet and TSH normal  05/11/2019. Recheck today. 2 gm sodium diet  HLD on lipitor managed by PCP.LDL 113 10/2018  PVC's-bigeminy today, asymptomatic. Check potassium.      Medication Adjustments/Labs and Tests Ordered: Current medicines are reviewed at length with the patient today.  Concerns regarding medicines are outlined above.  Medication changes, Labs and Tests ordered today are listed in the Patient Instructions below. Patient Instructions   Medication Instructions:  Your physician has recommended you make the following change in your medication:   INCREASE: valsartan to 160 mg once a day  *If you need a refill on your cardiac medications before your next appointment, please call your pharmacy*   Lab Work: TODAY: BMET  If you have labs (blood work) drawn today and your tests are completely normal, you will receive your results only by: Marland Kitchen MyChart Message (if you have MyChart) OR . A paper copy in the mail If you have any lab test that is abnormal or we need to change your treatment, we will call you to review the results.   Testing/Procedures: None ordered   Follow-Up: Follow up with Ermalinda Barrios, PA on 07/06/19 at 7:45 AM   Other Instructions Two Gram Sodium Diet 2000 mg  What is Sodium? Sodium is a mineral found naturally in many foods. The most significant source of sodium in the diet is table salt, which is about 40% sodium.  Processed, convenience, and preserved foods also contain a large amount of sodium.  The body needs only 500 mg of sodium daily to function,  A normal diet provides more than enough sodium even if you do not use salt.  Why Limit Sodium? A build up of sodium in the body can cause thirst, increased blood pressure, shortness of breath, and water retention.  Decreasing sodium in the diet can reduce edema and risk of heart attack or stroke associated with high blood pressure.  Keep in mind that there are many other factors involved in these health problems.  Heredity,  obesity, lack of exercise, cigarette smoking, stress and what you eat all play a role.  General Guidelines:  Do not add salt at the table or in cooking.  One teaspoon  of salt contains over 2 grams of sodium.  Read food labels  Avoid processed and convenience foods  Ask your dietitian before eating any foods not dicussed in the menu planning guidelines  Consult your physician if you wish to use a salt substitute or a sodium containing medication such as antacids.  Limit milk and milk products to 16 oz (2 cups) per day.  Shopping Hints:  READ LABELS!! "Dietetic" does not necessarily mean low sodium.  Salt and other sodium ingredients are often added to foods during processing.   Menu Planning Guidelines Food Group Choose More Often Avoid  Beverages (see also the milk group All fruit juices, low-sodium, salt-free vegetables juices, low-sodium carbonated beverages Regular vegetable or tomato juices, commercially softened water used for drinking or cooking  Breads and Cereals Enriched white, wheat, rye and pumpernickel bread, hard rolls and dinner rolls; muffins, cornbread and waffles; most dry cereals, cooked cereal without added salt; unsalted crackers and breadsticks; low sodium or homemade bread crumbs Bread, rolls and crackers with salted tops; quick breads; instant hot cereals; pancakes; commercial bread stuffing; self-rising flower and biscuit mixes; regular bread crumbs or cracker crumbs  Desserts and Sweets Desserts and sweets mad with mild should be within allowance Instant pudding mixes and cake mixes  Fats Butter or margarine; vegetable oils; unsalted salad dressings, regular salad dressings limited to 1 Tbs; light, sour and heavy cream Regular salad dressings containing bacon fat, bacon bits, and salt pork; snack dips made with instant soup mixes or processed cheese; salted nuts  Fruits Most fresh, frozen and canned fruits Fruits processed with salt or sodium-containing ingredient  (some dried fruits are processed with sodium sulfites        Vegetables Fresh, frozen vegetables and low- sodium canned vegetables Regular canned vegetables, sauerkraut, pickled vegetables, and others prepared in brine; frozen vegetables in sauces; vegetables seasoned with ham, bacon or salt pork  Condiments, Sauces, Miscellaneous  Salt substitute with physician's approval; pepper, herbs, spices; vinegar, lemon or lime juice; hot pepper sauce; garlic powder, onion powder, low sodium soy sauce (1 Tbs.); low sodium condiments (ketchup, chili sauce, mustard) in limited amounts (1 tsp.) fresh ground horseradish; unsalted tortilla chips, pretzels, potato chips, popcorn, salsa (1/4 cup) Any seasoning made with salt including garlic salt, celery salt, onion salt, and seasoned salt; sea salt, rock salt, kosher salt; meat tenderizers; monosodium glutamate; mustard, regular soy sauce, barbecue, sauce, chili sauce, teriyaki sauce, steak sauce, Worcestershire sauce, and most flavored vinegars; canned gravy and mixes; regular condiments; salted snack foods, olives, picles, relish, horseradish sauce, catsup   Food preparation: Try these seasonings Meats:    Pork Sage, onion Serve with applesauce  Chicken Poultry seasoning, thyme, parsley Serve with cranberry sauce  Lamb Curry powder, rosemary, garlic, thyme Serve with mint sauce or jelly  Veal Marjoram, basil Serve with current jelly, cranberry sauce  Beef Pepper, bay leaf Serve with dry mustard, unsalted chive butter  Fish [place] leaf, dill Serve with unsalted lemon butter, unsalted parsley butter  Vegetables:    Asparagus Lemon juice   Broccoli Lemon juice   Carrots Mustard dressing parsley, mint, nutmeg, glazed with unsalted butter and sugar   Green beans Marjoram, lemon juice, nutmeg,dill seed   Tomatoes Basil, marjoram, onion   Spice /blend for [employer]" 4 tsp ground thyme 1 tsp ground sage 3 tsp ground rosemary 4 tsp ground marjoram   Test your  knowledge 1. A product that says "Salt Free" may still contain sodium. True or False  2. Garlic Powder and Hot Pepper Sauce an be used as alternative seasonings.True or False 3. Processed foods have more sodium than fresh foods.  True or False 4. Canned Vegetables have less sodium than froze True or False  WAYS TO DECREASE YOUR SODIUM INTAKE 1. Avoid the use of added salt in cooking and at the table.  Table salt (and other prepared seasonings which contain salt) is probably one of the greatest sources of sodium in the diet.  Unsalted foods can gain flavor from the sweet, sour, and butter taste sensations of herbs and spices.  Instead of using salt for seasoning, try the following seasonings with the foods listed.  Remember: how you use them to enhance natural food flavors is limited only by your creativity... Allspice-Meat, fish, eggs, fruit, peas, red and yellow vegetables Almond Extract-Fruit baked goods Anise Seed-Sweet breads, fruit, carrots, beets, cottage cheese, cookies (tastes like licorice) Basil-Meat, fish, eggs, vegetables, rice, vegetables salads, soups, sauces Bay Leaf-Meat, fish, stews, poultry Burnet-Salad, vegetables (cucumber-like flavor) Caraway Seed-Bread, cookies, cottage cheese, meat, vegetables, cheese, rice Cardamon-Baked goods, fruit, soups Celery Powder or seed-Salads, salad dressings, sauces, meatloaf, soup, bread.Do not use  celery salt Chervil-Meats, salads, fish, eggs, vegetables, cottage cheese (parsley-like flavor) Chili Power-Meatloaf, chicken cheese, corn, eggplant, egg dishes Chives-Salads cottage cheese, egg dishes, soups, vegetables, sauces Cilantro-Salsa, casseroles Cinnamon-Baked goods, fruit, pork, lamb, chicken, carrots Cloves-Fruit, baked goods, fish, pot roast, green beans, beets, carrots Coriander-Pastry, cookies, meat, salads, cheese (lemon-orange flavor) Cumin-Meatloaf, fish,cheese, eggs, cabbage,fruit pie (caraway flavor) [employer], fruit,  eggs, fish, poultry, cottage cheese, vegetables Dill Seed-Meat, cottage cheese, poultry, vegetables, fish, salads, bread Fennel Seed-Bread, cookies, apples, pork, eggs, fish, beets, cabbage, cheese, Licorice-like flavor Garlic-(buds or powder) Salads, meat, poultry, fish, bread, butter, vegetables, potatoes.Do not  use garlic salt Ginger-Fruit, vegetables, baked goods, meat, fish, poultry Horseradish Root-Meet, vegetables, butter Lemon Juice or Extract-Vegetables, fruit, tea, baked goods, fish salads Mace-Baked goods fruit, vegetables, fish, poultry (taste like nutmeg) Maple Extract-Syrups Marjoram-Meat, chicken, fish, vegetables, breads, green salads (taste like Sage) Mint-Tea, lamb, sherbet, vegetables, desserts, carrots, cabbage Mustard, Dry or Seed-Cheese, eggs, meats, vegetables, poultry Nutmeg-Baked goods, fruit, chicken, eggs, vegetables, desserts Onion Powder-Meat, fish, poultry, vegetables, cheese, eggs, bread, rice salads (Do not use   Onion salt) Orange Extract-Desserts, baked goods Oregano-Pasta, eggs, cheese, onions, pork, lamb, fish, chicken, vegetables, green salads Paprika-Meat, fish, poultry, eggs, cheese, vegetables Parsley Flakes-Butter, vegetables, meat fish, poultry, eggs, bread, salads (certain forms may   Contain sodium Pepper-Meat fish, poultry, vegetables, eggs Peppermint Extract-Desserts, baked goods Poppy Seed-Eggs, bread, cheese, fruit dressings, baked goods, noodles, vegetables, cottage  [employer], poultry, meat, fish, cauliflower, turnips,eggs bread Saffron-Rice, bread, veal, chicken, fish, eggs Sage-Meat, fish, poultry, onions, eggplant, tomateos, pork, stews Savory-Eggs, salads, poultry, meat, rice, vegetables, soups, pork Tarragon-Meat, poultry, fish, eggs, butter, vegetables (licorice-like flavor)  Thyme-Meat, poultry, fish, eggs, vegetables, (clover-like flavor), sauces, soups Tumeric-Salads, butter, eggs,  fish, rice, vegetables (saffron-like flavor) Vanilla Extract-Baked goods, candy Vinegar-Salads, vegetables, meat marinades Walnut Extract-baked goods, candy  2. Choose your Foods Wisely   The following is a list of foods to avoid which are high in sodium:  Meats-Avoid all smoked, canned, salt cured, dried and kosher meat and fish as well as Anchovies   [employer] meats:Bologna, Liverwurst, Pastrami Canned meat or fish  Marinated herring Caviar    Pepperoni Corned Beef   Pizza Dried chipped beef  Salami Frozen breaded fish or meat Salt pork Frankfurters or hot dogs  Sardines Gefilte fish   Sausage Ham (boiled ham, Proscuitto Smoked butt    spiced ham)   Spam      TV Dinners Vegetables Canned vegetables (Regular) Relish Canned mushrooms  Sauerkraut Olives    Tomato juice Pickles  Bakery and Dessert Products Canned puddings  Cream pies Cheesecake   Decorated cakes Cookies  Beverages/Juices Tomato juice, regular  Gatorade   V-8 vegetable juice, regular  Breads and Cereals Biscuit mixes   Salted potato chips, corn chips, pretzels Bread stuffing mixes  Salted crackers and rolls Pancake and waffle mixes Self-rising flour  Seasonings Accent    Meat sauces Barbecue sauce  Meat tenderizer Catsup    Monosodium glutamate (MSG) Celery salt   Onion salt Chili sauce   Prepared mustard Garlic salt   Salt, seasoned salt, sea salt Gravy mixes   Soy sauce Horseradish   Steak sauce Ketchup   Tartar sauce Lite salt    Teriyaki sauce Marinade mixes   Worcestershire sauce  Others Baking powder   Cocoa and cocoa mixes Baking soda   Commercial casserole mixes Candy-caramels, chocolate  Dehydrated soups    Bars, fudge,nougats  Instant rice and pasta mixes Canned broth or soup  Maraschino cherries Cheese, aged and processed cheese and cheese spreads  Learning Assessment Quiz  Indicated T (for True) or F (for False) for each of the following  statements:  1. _____ Fresh fruits and vegetables and unprocessed grains are generally low in sodium 2. _____ Water may contain a considerable amount of sodium, depending on the source 3. _____ You can always tell if a food is high in sodium by tasting it 4. _____ Certain laxatives my be high in sodium and should be avoided unless prescribed   by a physician or pharmacist 5. _____ Salt substitutes may be used freely by anyone on a sodium restricted diet 6. _____ Sodium is present in table salt, food additives and as a natural component of   most foods 7. _____ Table salt is approximately 90% sodium 8. _____ Limiting sodium intake may help prevent excess fluid accumulation in the body 9. _____ On a sodium-restricted diet, seasonings such as bouillon soy sauce, and    cooking wine should be used in place of table salt 10. _____ On an ingredient list, a product which lists monosodium glutamate as the first   ingredient is an appropriate food to include on a low sodium diet  Circle the best answer(s) to the following statements (Hint: there may be more than one correct answer)  11. On a low-sodium diet, some acceptable snack items are:    A. Olives  F. Bean dip   K. Grapefruit juice    B. Salted Pretzels G. Commercial Popcorn   L. Canned peaches    C. Carrot Sticks  H. Bouillon   M. Unsalted nuts   D. [place] fries  I. Peanut butter crackers N. Salami   E. Sweet pickles J. Tomato Juice   O. Pizza  12.  Seasonings that may be used freely on a reduced - sodium diet include   A. Lemon wedges F.Monosodium glutamate K. Celery seed    B.Soysauce   G. Pepper   L. Mustard powder   C. Sea salt  H. Cooking wine  M. Onion flakes   D. Vinegar  E. Prepared horseradish N. Salsa   E. Sage   J. Worcestershire sauce  O. Chutney      Signed, Ermalinda Barrios, PA-C  06/08/2019 10:44 AM  [facility] [address], [place], [place]  [address] Phone: [phone]; Fax: [phone]

## 2019-06-08 ENCOUNTER — Encounter: Payer: Self-pay | Admitting: Physician Assistant

## 2019-06-08 ENCOUNTER — Ambulatory Visit (INDEPENDENT_AMBULATORY_CARE_PROVIDER_SITE_OTHER): Payer: Medicare Other | Admitting: Physician Assistant

## 2019-06-08 ENCOUNTER — Other Ambulatory Visit: Payer: Self-pay

## 2019-06-08 VITALS — BP 150/80 | HR 60 | Ht 67.0 in | Wt 188.2 lb

## 2019-06-08 DIAGNOSIS — I428 Other cardiomyopathies: Secondary | ICD-10-CM

## 2019-06-08 DIAGNOSIS — I493 Ventricular premature depolarization: Secondary | ICD-10-CM | POA: Diagnosis not present

## 2019-06-08 DIAGNOSIS — E785 Hyperlipidemia, unspecified: Secondary | ICD-10-CM | POA: Diagnosis not present

## 2019-06-08 DIAGNOSIS — I10 Essential (primary) hypertension: Secondary | ICD-10-CM

## 2019-06-08 LAB — BASIC METABOLIC PANEL
BUN/Creatinine Ratio: 15 (ref 10–24)
BUN: 26 mg/dL (ref 8–27)
CO2: 27 mmol/L (ref 20–29)
Calcium: 9 mg/dL (ref 8.6–10.2)
Chloride: 99 mmol/L (ref 96–106)
Creatinine, Ser: 1.71 mg/dL — ABNORMAL HIGH (ref 0.76–1.27)
GFR calc Af Amer: 47 mL/min/{1.73_m2} — ABNORMAL LOW (ref >59)
GFR calc non Af Amer: 41 mL/min/{1.73_m2} — ABNORMAL LOW (ref >59)
Glucose: 91 mg/dL (ref 65–99)
Potassium: 3.5 mmol/L (ref 3.5–5.2)
Sodium: 141 mmol/L (ref 134–144)

## 2019-06-08 MED ORDER — VALSARTAN 160 MG PO TABS: 160.0000 mg | ORAL_TABLET | Freq: Every day | ORAL | 3 refills | Status: DC

## 2019-06-08 NOTE — Patient Instructions (Addendum)
Medication Instructions:  Your physician has recommended you make the following change in your medication:   INCREASE: valsartan to 160 mg once a day  *If you need a refill on your cardiac medications before your next appointment, please call your pharmacy*   Lab Work: TODAY: BMET  If you have labs (blood work) drawn today and your tests are completely normal, you will receive your results only by: Marland Kitchen MyChart Message (if you have MyChart) OR . A paper copy in the mail If you have any lab test that is abnormal or we need to change your treatment, we will call you to review the results.   Testing/Procedures: None ordered   Follow-Up: Follow up with Ermalinda Barrios, PA on 07/06/19 at 7:45 AM   Other Instructions Two Gram Sodium Diet 2000 mg  What is Sodium? Sodium is a mineral found naturally in many foods. The most significant source of sodium in the diet is table salt, which is about 40% sodium.  Processed, convenience, and preserved foods also contain a large amount of sodium.  The body needs only 500 mg of sodium daily to function,  A normal diet provides more than enough sodium even if you do not use salt.  Why Limit Sodium? A build up of sodium in the body can cause thirst, increased blood pressure, shortness of breath, and water retention.  Decreasing sodium in the diet can reduce edema and risk of heart attack or stroke associated with high blood pressure.  Keep in mind that there are many other factors involved in these health problems.  Heredity, obesity, lack of exercise, cigarette smoking, stress and what you eat all play a role.  General Guidelines:  Do not add salt at the table or in cooking.  One teaspoon of salt contains over 2 grams of sodium.  Read food labels  Avoid processed and convenience foods  Ask your dietitian before eating any foods not dicussed in the menu planning guidelines  Consult your physician if you wish to use a salt substitute or a sodium  containing medication such as antacids.  Limit milk and milk products to 16 oz (2 cups) per day.  Shopping Hints:  READ LABELS!! "Dietetic" does not necessarily mean low sodium.  Salt and other sodium ingredients are often added to foods during processing.   Menu Planning Guidelines Food Group Choose More Often Avoid  Beverages (see also the milk group All fruit juices, low-sodium, salt-free vegetables juices, low-sodium carbonated beverages Regular vegetable or tomato juices, commercially softened water used for drinking or cooking  Breads and Cereals Enriched white, wheat, rye and pumpernickel bread, hard rolls and dinner rolls; muffins, cornbread and waffles; most dry cereals, cooked cereal without added salt; unsalted crackers and breadsticks; low sodium or homemade bread crumbs Bread, rolls and crackers with salted tops; quick breads; instant hot cereals; pancakes; commercial bread stuffing; self-rising flower and biscuit mixes; regular bread crumbs or cracker crumbs  Desserts and Sweets Desserts and sweets mad with mild should be within allowance Instant pudding mixes and cake mixes  Fats Butter or margarine; vegetable oils; unsalted salad dressings, regular salad dressings limited to 1 Tbs; light, sour and heavy cream Regular salad dressings containing bacon fat, bacon bits, and salt pork; snack dips made with instant soup mixes or processed cheese; salted nuts  Fruits Most fresh, frozen and canned fruits Fruits processed with salt or sodium-containing ingredient (some dried fruits are processed with sodium sulfites        Vegetables Fresh,  frozen vegetables and low- sodium canned vegetables Regular canned vegetables, sauerkraut, pickled vegetables, and others prepared in brine; frozen vegetables in sauces; vegetables seasoned with ham, bacon or salt pork  Condiments, Sauces, Miscellaneous  Salt substitute with physician's approval; pepper, herbs, spices; vinegar, lemon or lime juice;  hot pepper sauce; garlic powder, onion powder, low sodium soy sauce (1 Tbs.); low sodium condiments (ketchup, chili sauce, mustard) in limited amounts (1 tsp.) fresh ground horseradish; unsalted tortilla chips, pretzels, potato chips, popcorn, salsa (1/4 cup) Any seasoning made with salt including garlic salt, celery salt, onion salt, and seasoned salt; sea salt, rock salt, kosher salt; meat tenderizers; monosodium glutamate; mustard, regular soy sauce, barbecue, sauce, chili sauce, teriyaki sauce, steak sauce, Worcestershire sauce, and most flavored vinegars; canned gravy and mixes; regular condiments; salted snack foods, olives, picles, relish, horseradish sauce, catsup   Food preparation: Try these seasonings Meats:    Pork Sage, onion Serve with applesauce  Chicken Poultry seasoning, thyme, parsley Serve with cranberry sauce  Lamb Curry powder, rosemary, garlic, thyme Serve with mint sauce or jelly  Veal Marjoram, basil Serve with current jelly, cranberry sauce  Beef Pepper, bay leaf Serve with dry mustard, unsalted chive butter  Fish [place] leaf, dill Serve with unsalted lemon butter, unsalted parsley butter  Vegetables:    Asparagus Lemon juice   Broccoli Lemon juice   Carrots Mustard dressing parsley, mint, nutmeg, glazed with unsalted butter and sugar   Green beans Marjoram, lemon juice, nutmeg,dill seed   Tomatoes Basil, marjoram, onion   Spice /blend for [employer]" 4 tsp ground thyme 1 tsp ground sage 3 tsp ground rosemary 4 tsp ground marjoram   Test your knowledge 1. A product that says "Salt Free" may still contain sodium. True or False 2. Garlic Powder and Hot Pepper Sauce an be used as alternative seasonings.True or False 3. Processed foods have more sodium than fresh foods.  True or False 4. Canned Vegetables have less sodium than froze True or False  WAYS TO DECREASE YOUR SODIUM INTAKE 1. Avoid the use of added salt in cooking and at the table.  Table salt (and other  prepared seasonings which contain salt) is probably one of the greatest sources of sodium in the diet.  Unsalted foods can gain flavor from the sweet, sour, and butter taste sensations of herbs and spices.  Instead of using salt for seasoning, try the following seasonings with the foods listed.  Remember: how you use them to enhance natural food flavors is limited only by your creativity... Allspice-Meat, fish, eggs, fruit, peas, red and yellow vegetables Almond Extract-Fruit baked goods Anise Seed-Sweet breads, fruit, carrots, beets, cottage cheese, cookies (tastes like licorice) Basil-Meat, fish, eggs, vegetables, rice, vegetables salads, soups, sauces Bay Leaf-Meat, fish, stews, poultry Burnet-Salad, vegetables (cucumber-like flavor) Caraway Seed-Bread, cookies, cottage cheese, meat, vegetables, cheese, rice Cardamon-Baked goods, fruit, soups Celery Powder or seed-Salads, salad dressings, sauces, meatloaf, soup, bread.Do not use  celery salt Chervil-Meats, salads, fish, eggs, vegetables, cottage cheese (parsley-like flavor) Chili Power-Meatloaf, chicken cheese, corn, eggplant, egg dishes Chives-Salads cottage cheese, egg dishes, soups, vegetables, sauces Cilantro-Salsa, casseroles Cinnamon-Baked goods, fruit, pork, lamb, chicken, carrots Cloves-Fruit, baked goods, fish, pot roast, green beans, beets, carrots Coriander-Pastry, cookies, meat, salads, cheese (lemon-orange flavor) Cumin-Meatloaf, fish,cheese, eggs, cabbage,fruit pie (caraway flavor) [employer], fruit, eggs, fish, poultry, cottage cheese, vegetables Dill Seed-Meat, cottage cheese, poultry, vegetables, fish, salads, bread Fennel Seed-Bread, cookies, apples, pork, eggs, fish, beets, cabbage, cheese, Licorice-like flavor Garlic-(buds or powder) Salads, meat,  poultry, fish, bread, butter, vegetables, potatoes.Do not  use garlic salt Ginger-Fruit, vegetables, baked goods, meat, fish, poultry Horseradish Root-Meet, vegetables,  butter Lemon Juice or Extract-Vegetables, fruit, tea, baked goods, fish salads Mace-Baked goods fruit, vegetables, fish, poultry (taste like nutmeg) Maple Extract-Syrups Marjoram-Meat, chicken, fish, vegetables, breads, green salads (taste like Sage) Mint-Tea, lamb, sherbet, vegetables, desserts, carrots, cabbage Mustard, Dry or Seed-Cheese, eggs, meats, vegetables, poultry Nutmeg-Baked goods, fruit, chicken, eggs, vegetables, desserts Onion Powder-Meat, fish, poultry, vegetables, cheese, eggs, bread, rice salads (Do not use   Onion salt) Orange Extract-Desserts, baked goods Oregano-Pasta, eggs, cheese, onions, pork, lamb, fish, chicken, vegetables, green salads Paprika-Meat, fish, poultry, eggs, cheese, vegetables Parsley Flakes-Butter, vegetables, meat fish, poultry, eggs, bread, salads (certain forms may   Contain sodium Pepper-Meat fish, poultry, vegetables, eggs Peppermint Extract-Desserts, baked goods Poppy Seed-Eggs, bread, cheese, fruit dressings, baked goods, noodles, vegetables, cottage  [employer], poultry, meat, fish, cauliflower, turnips,eggs bread Saffron-Rice, bread, veal, chicken, fish, eggs Sage-Meat, fish, poultry, onions, eggplant, tomateos, pork, stews Savory-Eggs, salads, poultry, meat, rice, vegetables, soups, pork Tarragon-Meat, poultry, fish, eggs, butter, vegetables (licorice-like flavor)  Thyme-Meat, poultry, fish, eggs, vegetables, (clover-like flavor), sauces, soups Tumeric-Salads, butter, eggs, fish, rice, vegetables (saffron-like flavor) Vanilla Extract-Baked goods, candy Vinegar-Salads, vegetables, meat marinades Walnut Extract-baked goods, candy  2. Choose your Foods Wisely   The following is a list of foods to avoid which are high in sodium:  Meats-Avoid all smoked, canned, salt cured, dried and kosher meat and fish as well as Anchovies   [employer] meats:Bologna, Liverwurst, Pastrami Canned meat  or fish  Marinated herring Caviar    Pepperoni Corned Beef   Pizza Dried chipped beef  Salami Frozen breaded fish or meat Salt pork Frankfurters or hot dogs  Sardines Gefilte fish   Sausage Ham (boiled ham, Proscuitto Smoked butt    spiced ham)   Spam      TV Dinners Vegetables Canned vegetables (Regular) Relish Canned mushrooms  Sauerkraut Olives    Tomato juice Pickles  Bakery and Dessert Products Canned puddings  Cream pies Cheesecake   Decorated cakes Cookies  Beverages/Juices Tomato juice, regular  Gatorade   V-8 vegetable juice, regular  Breads and Cereals Biscuit mixes   Salted potato chips, corn chips, pretzels Bread stuffing mixes  Salted crackers and rolls Pancake and waffle mixes Self-rising flour  Seasonings Accent    Meat sauces Barbecue sauce  Meat tenderizer Catsup    Monosodium glutamate (MSG) Celery salt   Onion salt Chili sauce   Prepared mustard Garlic salt   Salt, seasoned salt, sea salt Gravy mixes   Soy sauce Horseradish   Steak sauce Ketchup   Tartar sauce Lite salt    Teriyaki sauce Marinade mixes   Worcestershire sauce  Others Baking powder   Cocoa and cocoa mixes Baking soda   Commercial casserole mixes Candy-caramels, chocolate  Dehydrated soups    Bars, fudge,nougats  Instant rice and pasta mixes Canned broth or soup  Maraschino cherries Cheese, aged and processed cheese and cheese spreads  Learning Assessment Quiz  Indicated T (for True) or F (for False) for each of the following statements:  1. _____ Fresh fruits and vegetables and unprocessed grains are generally low in sodium 2. _____ Water may contain a considerable amount of sodium, depending on the source 3. _____ You can always tell if a food is high in sodium by tasting it 4. _____ Certain laxatives my be high in sodium and should be  avoided unless prescribed   by a physician or pharmacist 5. _____ Salt substitutes may be used freely by anyone on a sodium restricted  diet 6. _____ Sodium is present in table salt, food additives and as a natural component of   most foods 7. _____ Table salt is approximately 90% sodium 8. _____ Limiting sodium intake may help prevent excess fluid accumulation in the body 9. _____ On a sodium-restricted diet, seasonings such as bouillon soy sauce, and    cooking wine should be used in place of table salt 10. _____ On an ingredient list, a product which lists monosodium glutamate as the first   ingredient is an appropriate food to include on a low sodium diet  Circle the best answer(s) to the following statements (Hint: there may be more than one correct answer)  11. On a low-sodium diet, some acceptable snack items are:    A. Olives  F. Bean dip   K. Grapefruit juice    B. Salted Pretzels G. Commercial Popcorn   L. Canned peaches    C. Carrot Sticks  H. Bouillon   M. Unsalted nuts   D. [place] fries  I. Peanut butter crackers N. Salami   E. Sweet pickles J. Tomato Juice   O. Pizza  12.  Seasonings that may be used freely on a reduced - sodium diet include   A. Lemon wedges F.Monosodium glutamate K. Celery seed    B.Soysauce   G. Pepper   L. Mustard powder   C. Sea salt  H. Cooking wine  M. Onion flakes   D. Vinegar  E. Prepared horseradish N. Salsa   E. Sage   J. Worcestershire sauce  O. Chutney

## 2019-06-29 NOTE — Progress Notes (Deleted)
Cardiology Office Note    Date:  06/29/2019   ID:  Daniel Obrien, DOB July 10, 1950, MRN [id number]  PCP:  [facility]  Cardiologist: Ena Dawley, MD EPS: None  No chief complaint on file.   History of Present Illness:  Daniel Obrien is a 69 y.o. male  Was diagnosed dilated cardiomyopathy/combined systolic and diastolic heart failure in the setting of hypertensive urgency in 2019 echo LVEF 30 to 35%.  Also had acute kidney injury at that time which resolved.  Follow-up echo 03/23/2018 LVEF improved to 50 to 55% with no wall motion abnormality and grade 2 DD with mild MR AI.   I saw the patient 06/08/2019 complaining of dyspnea on exertion when the weather is cold.  Blood pressure was up.  PCP had recently added Lasix Diovan and Lipitor. He was getting extra salt in his diet. I increased his diovan 160 mg daily.  Creatinine was 1.71(down from 1.82 on 04/09/2018) potassium 3.5 that day     Past Medical History:  Diagnosis Date  . Acute CHF (congestive heart failure) ([place]) 12/17/2017  . ARF (acute renal failure) ([place]) 12/17/2017  . Cardiomyopathy ([place]), new, EF 30-35% 12/18/2017  . Chest pain 12/17/2017  . Demand ischemia ([place])   . Diabetes ([place])   . Elevated troponin 12/18/2017  . Hydroureteronephrosis, left, mild to moderate 12/18/2017  . Hypertension   . Hyperthyroidism 12/18/2017  . Penile pain 12/18/2017    Past Surgical History:  Procedure Laterality Date  . NOSE SURGERY      Current Medications: No outpatient medications have been marked as taking for the 07/06/19 encounter (Appointment) with Imogene Burn, PA-C.     Allergies:   Heparin   Social History   Socioeconomic History  . Marital status: Married    Spouse name: Not on file  . Number of children: Not on file  . Years of education: Not on file  . Highest education level: Not on file  Occupational History  . Occupation: Retired  Tobacco Use  . Smoking status: Never Smoker  . Smokeless  tobacco: Never Used  Substance and Sexual Activity  . Alcohol use: Never  . Drug use: Never  . Sexual activity: Not on file  Other Topics Concern  . Not on file  Social History Narrative   Pt and wife are [place], [place]. Wife speaks a little [place] and [place], a little [place], mostly [place].   Husband speaks more [place] and [place].   Social Determinants of Health   Financial Resource Strain:   . Difficulty of Paying Living Expenses:   Food Insecurity:   . Worried About Charity fundraiser in the Last Year:   . Arboriculturist in the Last Year:   Transportation Needs:   . Film/video editor (Medical):   Marland Kitchen Lack of Transportation (Non-Medical):   Physical Activity:   . Days of Exercise per Week:   . Minutes of Exercise per Session:   Stress:   . Feeling of Stress :   Social Connections:   . Frequency of Communication with Friends and Family:   . Frequency of Social Gatherings with Friends and Family:   . Attends Religious Services:   . Active Member of Clubs or Organizations:   . Attends Archivist Meetings:   Marland Kitchen Marital Status:      Family History:  The patient's ***family history includes Unexplained death (age of onset: 13) in his father; Unexplained death (age of onset:  98) in his mother.   ROS:   Please see the history of present illness.    ROS All other systems reviewed and are negative.   PHYSICAL EXAM:   VS:  There were no vitals taken for this visit.  Physical Exam  GEN: Well nourished, well developed, in no acute distress  HEENT: normal  Neck: no JVD, carotid bruits, or masses Cardiac:RRR; no murmurs, rubs, or gallops  Respiratory:  clear to auscultation bilaterally, normal work of breathing GI: soft, nontender, nondistended, + BS Ext: without cyanosis, clubbing, or edema, Good distal pulses bilaterally MS: no deformity or atrophy  Skin: warm and dry, no rash Neuro:  Alert and Oriented x 3, Strength and sensation are intact Psych:  euthymic mood, full affect  Wt Readings from Last 3 Encounters:  06/08/19 188 lb 3.2 oz (85.4 kg)  03/25/18 184 lb 1.9 oz (83.5 kg)  01/23/18 182 lb (82.6 kg)      Studies/Labs Reviewed:   EKG:  EKG is*** ordered today.  The ekg ordered today demonstrates ***  Recent Labs: 06/08/2019: BUN 26; Creatinine, Ser 1.71; Potassium 3.5; Sodium 141   Lipid Panel No results found for: CHOL, TRIG, HDL, CHOLHDL, VLDL, LDLCALC, LDLDIRECT  Additional studies/ records that were reviewed today include:     Echocardiogram 03/23/2018: Study Conclusions   - Left ventricle: The cavity size was normal. Wall thickness was   increased in a pattern of severe LVH. Systolic function was   normal. The estimated ejection fraction was in the range of 50%   to 55%. Wall motion was normal; there were no regional wall   motion abnormalities. Features are consistent with a pseudonormal   left ventricular filling pattern, with concomitant abnormal   relaxation and increased filling pressure (grade 2 diastolic   dysfunction). - Aortic valve: There was mild regurgitation. - Aorta: Aortic root dimension: 38 mm (ED). Ascending aortic   diameter: 38 mm (S). - Aortic root: The aortic root was mildly dilated. - Ascending aorta: The ascending aorta was mildly dilated. - Mitral valve: There was mild regurgitation. - Left atrium: The atrium was severely dilated. - Tricuspid valve: There was trivial regurgitation. - Pulmonary arteries: PA peak pressure: 31 mm Hg (S).   Echocardiogram 12/18/2017: Study Conclusions   - Left ventricle: LVEF is approximately 30% with diffuse   hypokinesis. The cavity size was severely dilated. Wall thickness   was normal. - Aortic valve: There was mild regurgitation. - Mitral valve: There was mild to moderate regurgitation. - Left atrium: The atrium was severely dilated. - Right ventricle: The cavity size was mildly dilated. Systolic   function was moderately reduced. - Right  atrium: The atrium was mildly dilated.          ASSESSMENT:    1. Dilated cardiomyopathy ([place])   2. Essential hypertension   3. Hyperlipidemia, unspecified hyperlipidemia type   4. PVC (premature ventricular contraction)      PLAN:  In order of problems listed above:  Dilated cardiomyopathy with acute systolic and diastolic CHF in the setting of hypertensive urgency 2019.  LVEF 30% improved to 50 to 55% with grade 2 DD on echo 03/2018-no heart failure on exam.  He complains of dyspnea on exertion in cold weather but this has been stable.   Essential hypertension   Blood pressure running high LOV and   Diovan increased and asked to follow a low salt diet    HLD on lipitor managed by PCP.LDL 113 10/2018   PVC's-bigeminy  lov, asymptomatic.           Medication Adjustments/Labs and Tests Ordered: Current medicines are reviewed at length with the patient today.  Concerns regarding medicines are outlined above.  Medication changes, Labs and Tests ordered today are listed in the Patient Instructions below. There are no Patient Instructions on file for this visit.   Sumner Boast, PA-C  06/29/2019 11:10 AM    [facility] [place], [place], [place]  [address] Phone: [phone]; Fax: [phone]

## 2019-06-30 ENCOUNTER — Ambulatory Visit: Payer: Medicare Other | Admitting: Physician Assistant

## 2019-07-06 ENCOUNTER — Ambulatory Visit: Payer: Medicare Other | Admitting: Physician Assistant

## 2019-07-14 ENCOUNTER — Encounter: Payer: Self-pay | Admitting: Cardiology

## 2019-07-14 ENCOUNTER — Ambulatory Visit (INDEPENDENT_AMBULATORY_CARE_PROVIDER_SITE_OTHER): Payer: Medicare Other | Admitting: Cardiology

## 2019-07-14 ENCOUNTER — Other Ambulatory Visit: Payer: Self-pay

## 2019-07-14 VITALS — BP 150/82 | HR 56 | Ht 65.0 in | Wt 186.0 lb

## 2019-07-14 DIAGNOSIS — I110 Hypertensive heart disease with heart failure: Secondary | ICD-10-CM | POA: Diagnosis not present

## 2019-07-14 DIAGNOSIS — I428 Other cardiomyopathies: Secondary | ICD-10-CM

## 2019-07-14 DIAGNOSIS — I5043 Acute on chronic combined systolic (congestive) and diastolic (congestive) heart failure: Secondary | ICD-10-CM | POA: Diagnosis not present

## 2019-07-14 NOTE — Progress Notes (Signed)
Cardiology Office Note    Date:  07/14/2019   ID:  Daniel Obrien, DOB 1950/05/02, MRN [id number]  PCP:  [facility]  Cardiologist: Ena Dawley, MD EPS: None  Reason for visit: 1 month follow-up  History of Present Illness:  Daniel Obrien is a 69 y.o. male  Was diagnosed dilated cardiomyopathy/combined systolic and diastolic heart failure in the setting of hypertensive urgency in 2019 echo LVEF 30 to 35%.  Also had acute kidney injury at that time which resolved.  Follow-up echo 03/23/2018 LVEF improved to 50 to 55% with no wall motion abnormality and grade 2 DD with mild MR AI.  06/08/2019 - patient comes in today accompanied by interpreter and his son. Complains of dyspnea on exertion when the weather is cold. Does exercises at home and walks some 5-10 min. BP up today. They have a BP cuff at home but they don't check it often.Wife cooks with a lot of salt. Meds changed recently by PCP but son doesn't know what he's taking but we called the pharmacy to verify and Lasix, Diovan and lipitor added.   Denies chest pain, palpitations, dizziness or edema.  07/14/2019 -the patient is coming after 4 weeks, he is feeling slightly better, denies any orthopnea, proximal nocturnal dyspnea or lower extremity edema, he states that he has better and worse days but he is able to perform all activities of daily living.  He is able to perform activities such as grocery shopping and walk around.  He has no palpitations, no chest pain.  He has been compliant with his medications and does not have side effects.   Past Medical History:  Diagnosis Date  . Acute CHF (congestive heart failure) ([place]) 12/17/2017  . ARF (acute renal failure) ([place]) 12/17/2017  . Cardiomyopathy ([place]), new, EF 30-35% 12/18/2017  . Chest pain 12/17/2017  . Demand ischemia ([place])   . Diabetes ([place])   . Elevated troponin 12/18/2017  . Hydroureteronephrosis, left, mild to moderate 12/18/2017  . Hypertension   .  Hyperthyroidism 12/18/2017  . Penile pain 12/18/2017    Past Surgical History:  Procedure Laterality Date  . NOSE SURGERY      Current Medications: No outpatient medications have been marked as taking for the 07/14/19 encounter (Office Visit) with Dorothy Spark, MD.     Allergies:   Heparin   Social History   Socioeconomic History  . Marital status: Married    Spouse name: Not on file  . Number of children: Not on file  . Years of education: Not on file  . Highest education level: Not on file  Occupational History  . Occupation: Retired  Tobacco Use  . Smoking status: Never Smoker  . Smokeless tobacco: Never Used  Substance and Sexual Activity  . Alcohol use: Never  . Drug use: Never  . Sexual activity: Not on file  Other Topics Concern  . Not on file  Social History Narrative   Pt and wife are [place], [place]. Wife speaks a little [place] and [place], a little [place], mostly [place].   Husband speaks more [place] and [place].   Social Determinants of Health   Financial Resource Strain:   . Difficulty of Paying Living Expenses:   Food Insecurity:   . Worried About Charity fundraiser in the Last Year:   . Arboriculturist in the Last Year:   Transportation Needs:   . Film/video editor (Medical):   Marland Kitchen Lack of Transportation (Non-Medical):  Physical Activity:   . Days of Exercise per Week:   . Minutes of Exercise per Session:   Stress:   . Feeling of Stress :   Social Connections:   . Frequency of Communication with Friends and Family:   . Frequency of Social Gatherings with Friends and Family:   . Attends Religious Services:   . Active Member of Clubs or Organizations:   . Attends Archivist Meetings:   Marland Kitchen Marital Status:      Family History:  The patient's family history includes Unexplained death (age of onset: 29) in his father; Unexplained death (age of onset: 33) in his mother.   ROS:   Please see the history of present illness.     ROS All other systems reviewed and are negative.   PHYSICAL EXAM:   VS:  BP (!) 150/82   Pulse (!) 56   Ht 5\' 5"  (1.651 m)   Wt 186 lb (84.4 kg)   SpO2 96%   BMI 30.95 kg/m   Physical Exam  GEN: Well nourished, well developed, in no acute distress  Neck: no JVD, carotid bruits, or masses Cardiac:RRR; no murmurs, rubs, or gallops  Respiratory:  clear to auscultation bilaterally, normal work of breathing GI: soft, nontender, nondistended, + BS Ext: without cyanosis, clubbing, or edema, Good distal pulses bilaterally Neuro:  Alert and Oriented x 3 Psych: euthymic mood, full affect  Wt Readings from Last 3 Encounters:  07/14/19 186 lb (84.4 kg)  06/08/19 188 lb 3.2 oz (85.4 kg)  03/25/18 184 lb 1.9 oz (83.5 kg)      Studies/Labs Reviewed:   EKG:  EKG is  ordered today.  The ekg ordered today demonstrates normal sinus rhythm with bigeminy, nonspecific ST-T wave changes, bigeminy is new from last EKG tracing  Recent Labs: 06/08/2019: BUN 26; Creatinine, Ser 1.71; Potassium 3.5; Sodium 141   Lipid Panel No results found for: CHOL, TRIG, HDL, CHOLHDL, VLDL, LDLCALC, LDLDIRECT  Additional studies/ records that were reviewed today include:    Echocardiogram 03/23/2018: Study Conclusions   - Left ventricle: The cavity size was normal. Wall thickness was   increased in a pattern of severe LVH. Systolic function was   normal. The estimated ejection fraction was in the range of 50%   to 55%. Wall motion was normal; there were no regional wall   motion abnormalities. Features are consistent with a pseudonormal   left ventricular filling pattern, with concomitant abnormal   relaxation and increased filling pressure (grade 2 diastolic   dysfunction). - Aortic valve: There was mild regurgitation. - Aorta: Aortic root dimension: 38 mm (ED). Ascending aortic   diameter: 38 mm (S). - Aortic root: The aortic root was mildly dilated. - Ascending aorta: The ascending aorta was mildly  dilated. - Mitral valve: There was mild regurgitation. - Left atrium: The atrium was severely dilated. - Tricuspid valve: There was trivial regurgitation. - Pulmonary arteries: PA peak pressure: 31 mm Hg (S).   Echocardiogram 12/18/2017: Study Conclusions   - Left ventricle: LVEF is approximately 30% with diffuse   hypokinesis. The cavity size was severely dilated. Wall thickness   was normal. - Aortic valve: There was mild regurgitation. - Mitral valve: There was mild to moderate regurgitation. - Left atrium: The atrium was severely dilated. - Right ventricle: The cavity size was mildly dilated. Systolic   function was moderately reduced. - Right atrium: The atrium was mildly dilated.      ASSESSMENT:    1.  NICM (nonischemic cardiomyopathy) ([place])   2. Acute on chronic combined systolic and diastolic CHF (congestive heart failure) ([place])   3. Hypertensive heart disease with congestive heart failure, unspecified heart failure type ([place])      PLAN:  In order of problems listed above:  Dilated cardiomyopathy with acute systolic and diastolic CHF in the setting of hypertensive urgency 2019.  LVEF 30% improved to 50 to 55% with grade 2 DD on echo 03/2018-no heart failure on exam.  Repeated blood pressure today 124/74, will continue the same regimen.  Essential hypertension -as above.  HLD on lipitor managed by PCP.LDL 113 10/2018  PVC's-bigeminy today, asymptomatic.   Medication Adjustments/Labs and Tests Ordered: Current medicines are reviewed at length with the patient today.  Concerns regarding medicines are outlined above.  Medication changes, Labs and Tests ordered today are listed in the Patient Instructions below. Patient Instructions  Medication Instructions:   Your physician recommends that you continue on your current medications as directed. Please refer to the Current Medication list given to you today.  *If you need a refill on your cardiac medications before your  next appointment, please call your pharmacy*   Follow-Up: At [facility], you and your health needs are our priority.  As part of our continuing mission to provide you with exceptional heart care, we have created designated Provider Care Teams.  These Care Teams include your primary Cardiologist (physician) and Advanced Practice Providers (APPs -  Physician Assistants and Nurse Practitioners) who all work together to provide you with the care you need, when you need it.  We recommend signing up for the patient portal called "MyChart".  Sign up information is provided on this After Visit Summary.  MyChart is used to connect with patients for Virtual Visits (Telemedicine).  Patients are able to view lab/test results, encounter notes, upcoming appointments, etc.  Non-urgent messages can be sent to your provider as well.   To learn more about what you can do with MyChart, go to [email or web address].    Your next appointment:   6 month(s)  The format for your next appointment:   In Person  Provider:   Ena Dawley, MD       Signed, Ena Dawley, MD  07/14/2019 7:38 PM    [address], [place], [place]  [address] Phone: [phone]; Fax: [phone]

## 2019-07-14 NOTE — Patient Instructions (Signed)

## 2019-07-30 DIAGNOSIS — Z23 Encounter for immunization: Secondary | ICD-10-CM | POA: Diagnosis not present

## 2019-08-27 DIAGNOSIS — Z23 Encounter for immunization: Secondary | ICD-10-CM | POA: Diagnosis not present

## 2019-08-30 IMAGING — US US RENAL
1 series · 14 of 22 positions shown · non-contrast
Comparison: None.

CLINICAL DATA: Elevated creatinine

EXAM:
RENAL / URINARY TRACT ULTRASOUND COMPLETE

[Series 1: us renal · 0.24mm/px · 14 of 22 slices shown]
[im 1/22]
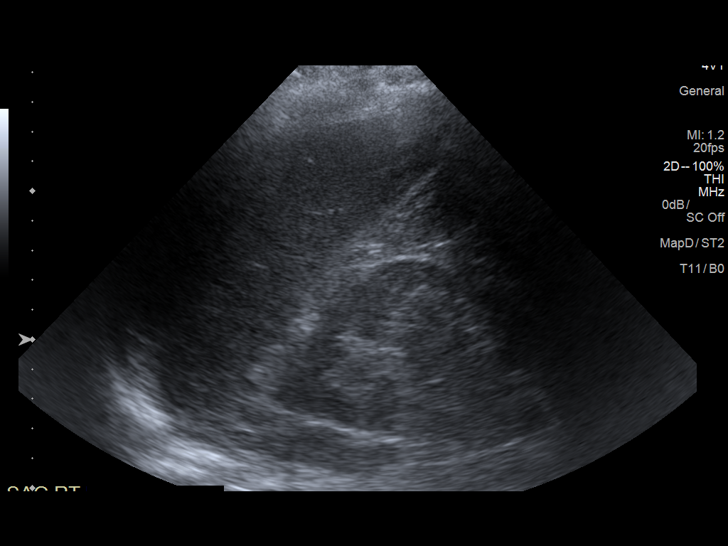
[im 3/22]
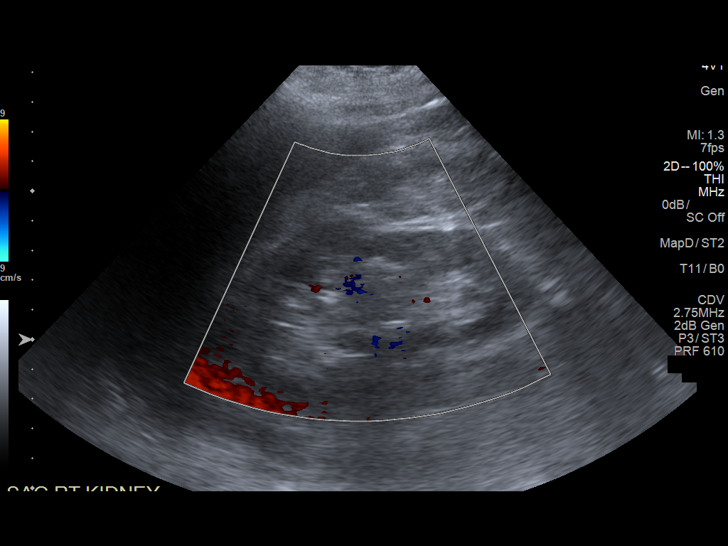
[im 4/22]
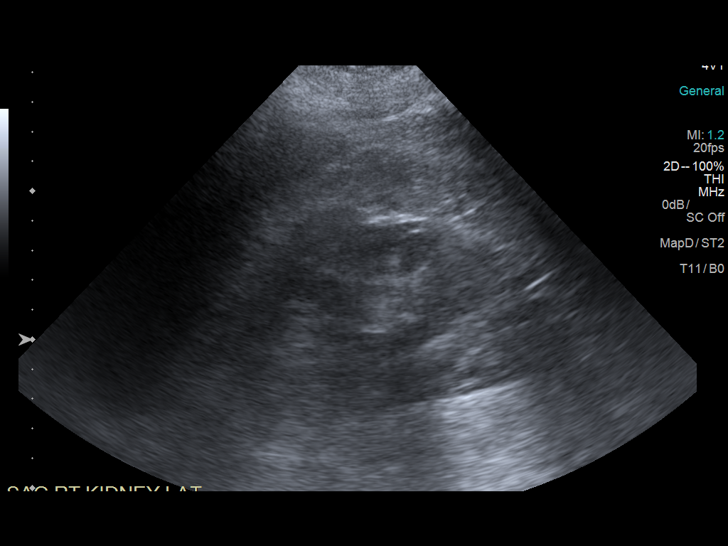
[im 6/22]
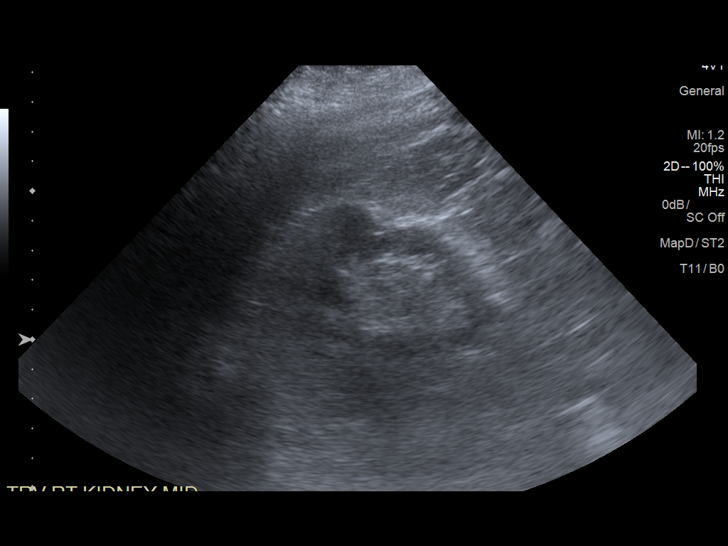
[im 8/22]
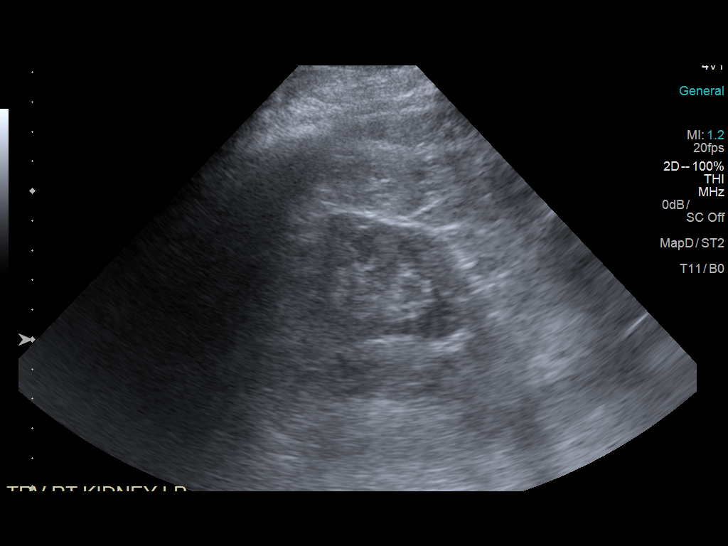
[im 9/22]
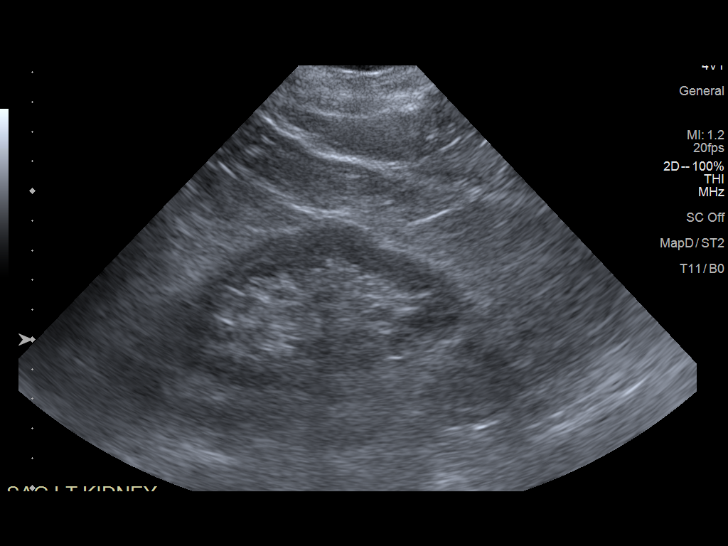
[im 11/22]
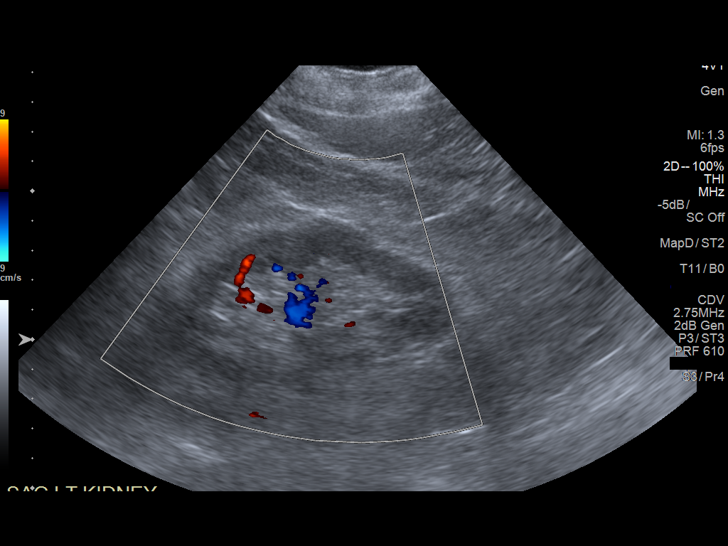
[im 12/22]
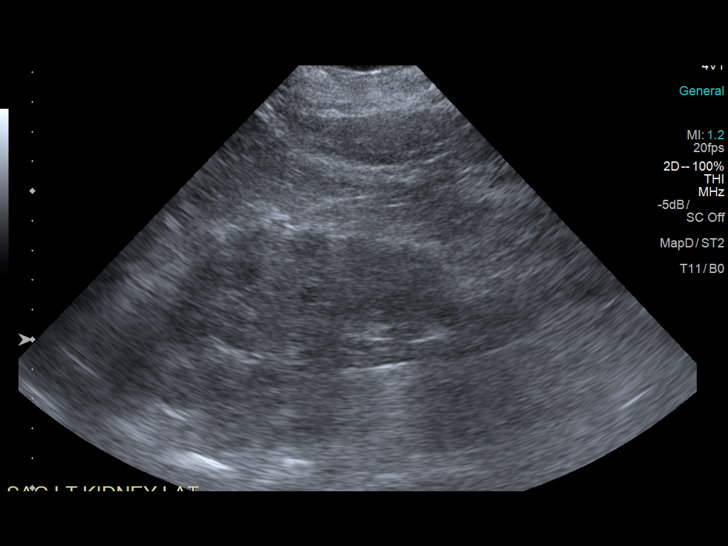
[im 14/22]
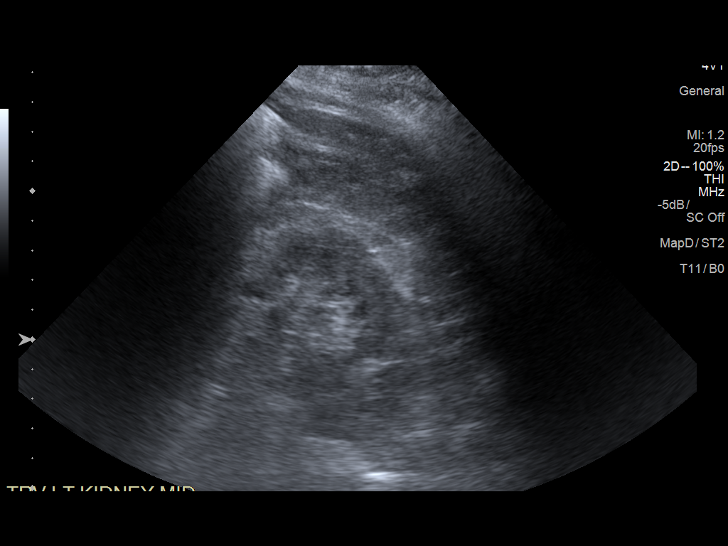
[im 15/22]
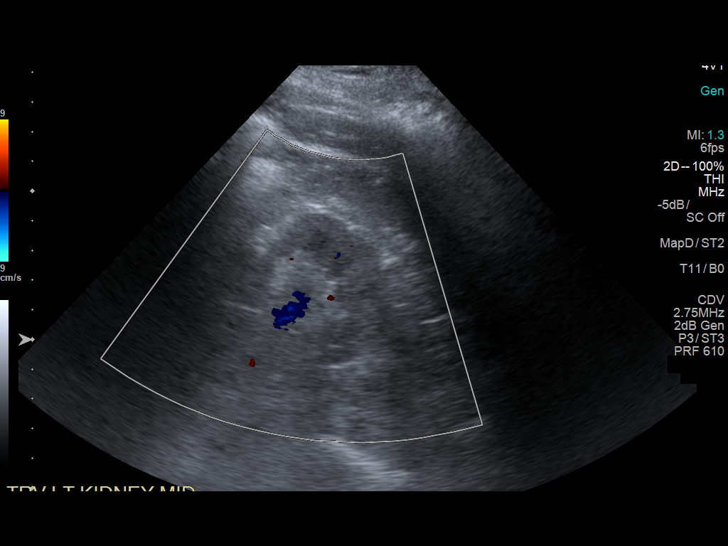
[im 17/22]
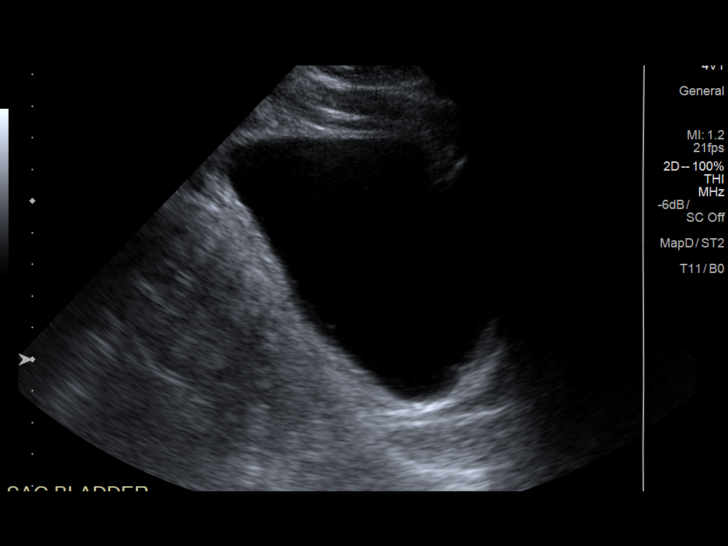
[im 19/22]
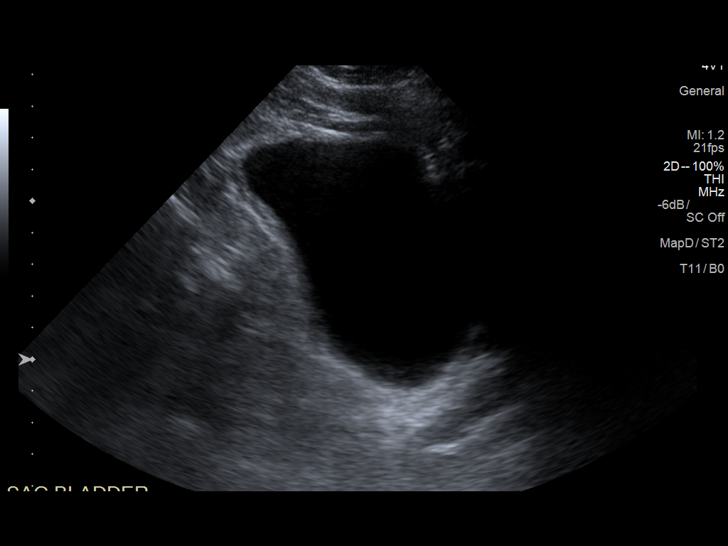
[im 20/22]
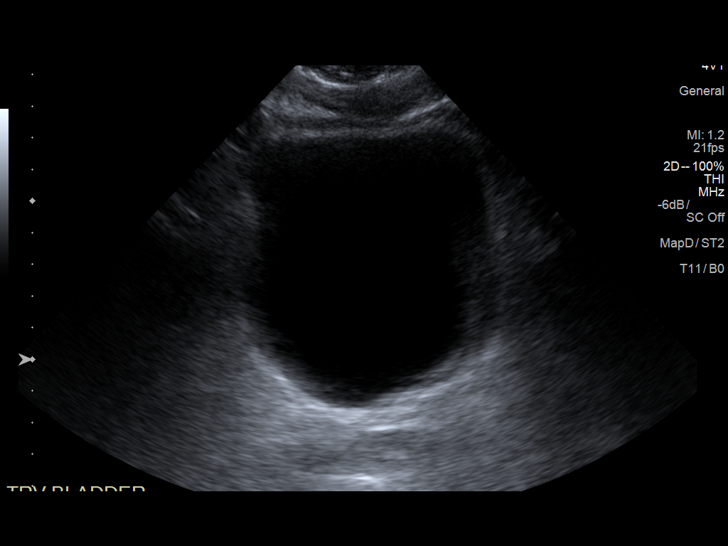
[im 22/22]
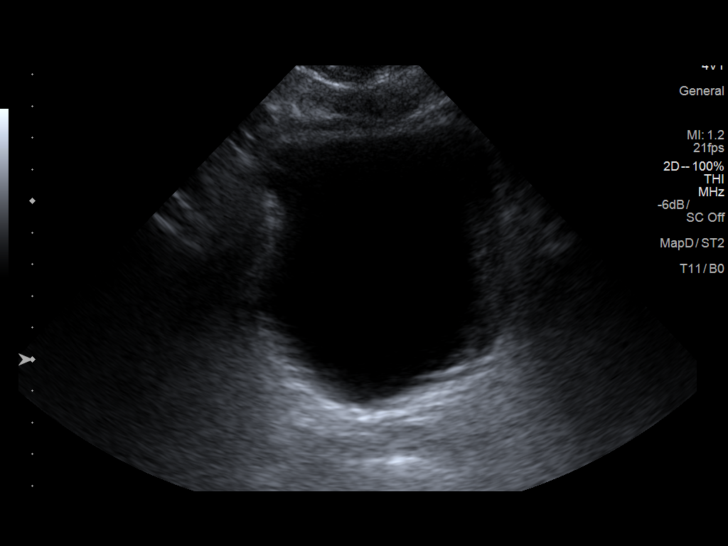

[14 of 22 positions shown; findings below may reference images not displayed]

FINDINGS: Right Kidney:

Length: 9.9 cm. Mild diffuse parenchymal atrophy. No focal mass
lesion. No hydronephrosis.

Left Kidney:

Length: 9.4 cm. Mild diffuse parenchymal atrophy. No focal mass
lesion. No hydronephrosis.

Bladder:

Appears normal for degree of bladder distention.
IMPRESSION: Mild renal parenchymal atrophy bilaterally.  No hydronephrosis.

## 2019-11-24 DIAGNOSIS — I5022 Chronic systolic (congestive) heart failure: Secondary | ICD-10-CM | POA: Diagnosis not present

## 2019-11-24 DIAGNOSIS — Z125 Encounter for screening for malignant neoplasm of prostate: Secondary | ICD-10-CM | POA: Diagnosis not present

## 2019-11-24 DIAGNOSIS — Z1159 Encounter for screening for other viral diseases: Secondary | ICD-10-CM | POA: Diagnosis not present

## 2019-11-24 DIAGNOSIS — Z79899 Other long term (current) drug therapy: Secondary | ICD-10-CM | POA: Diagnosis not present

## 2019-11-24 DIAGNOSIS — Z0001 Encounter for general adult medical examination with abnormal findings: Secondary | ICD-10-CM | POA: Diagnosis not present

## 2019-11-24 DIAGNOSIS — N1831 Chronic kidney disease, stage 3a: Secondary | ICD-10-CM | POA: Diagnosis not present

## 2019-11-24 DIAGNOSIS — I10 Essential (primary) hypertension: Secondary | ICD-10-CM | POA: Diagnosis not present

## 2019-11-24 DIAGNOSIS — E7800 Pure hypercholesterolemia, unspecified: Secondary | ICD-10-CM | POA: Diagnosis not present

## 2020-06-26 IMAGING — CT CT ANGIO CHEST-ABD-PELV FOR DISSECTION W/ AND WO/W CM
2 of 7 series · 13 of 46 positions shown, 15 images · IV contrast (ISOVUE)
Comparison: 12/17/2017

CLINICAL DATA: Hypertension, shortness of breath, chest pain

EXAM:
CT ANGIOGRAPHY CHEST, ABDOMEN AND PELVIS
TECHNIQUE: Multidetector CT imaging through the chest, abdomen and pelvis was
performed using the standard protocol during bolus administration of
intravenous contrast. Multiplanar reconstructed images and MIPs were
obtained and reviewed to evaluate the vascular anatomy.
CONTRAST:  80mL BPJFZV-DJU IOPAMIDOL (BPJFZV-DJU) INJECTION 76%

[Series 7: axial arterial · axial · arterial · 0.82mm/px · z∈[+1045,+1591]mm · 10 of 212 slices shown, 12 images]
[im 15/212  soft-tissue]
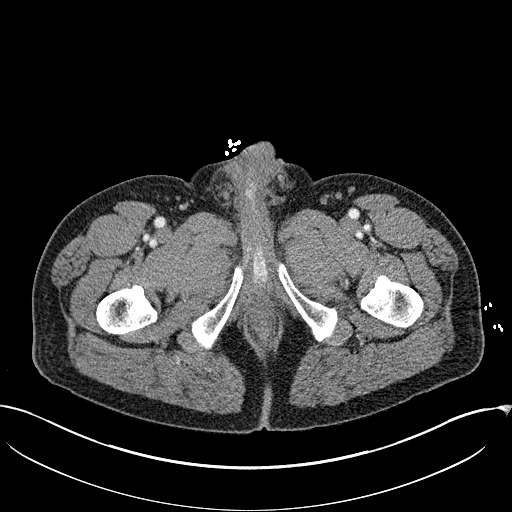
[im 15/212  bone]
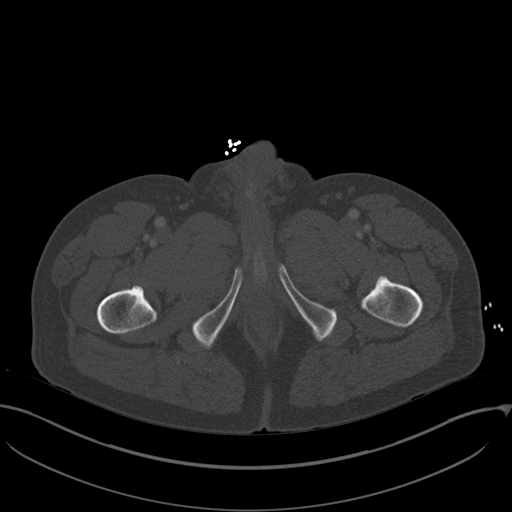
[im 43/212  soft-tissue]
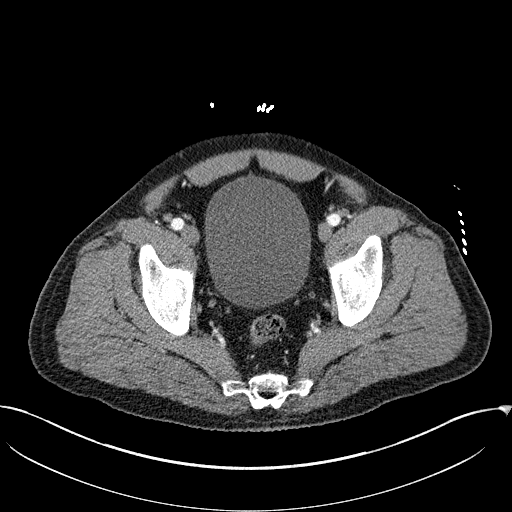
[im 57/212  soft-tissue]
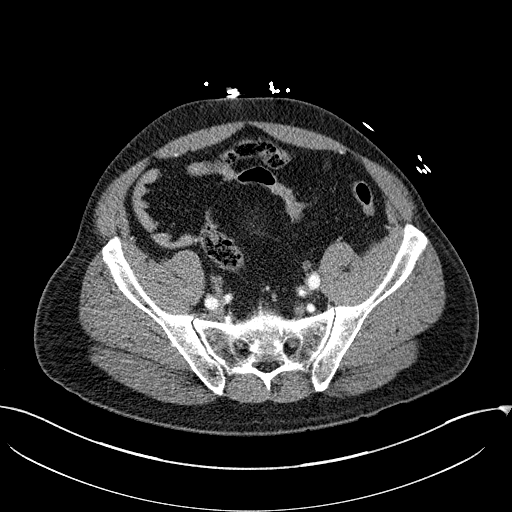
[im 71/212  soft-tissue]
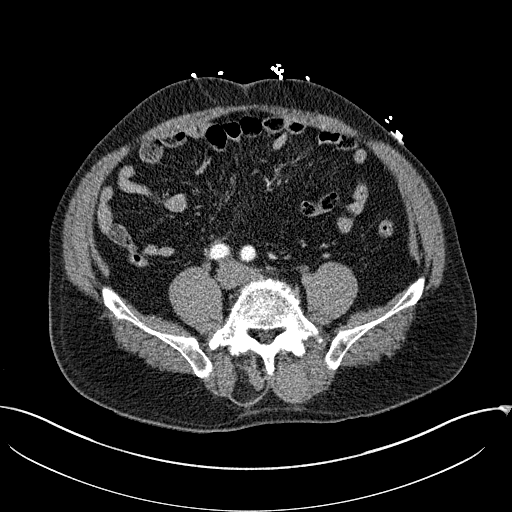
[im 99/212  soft-tissue]
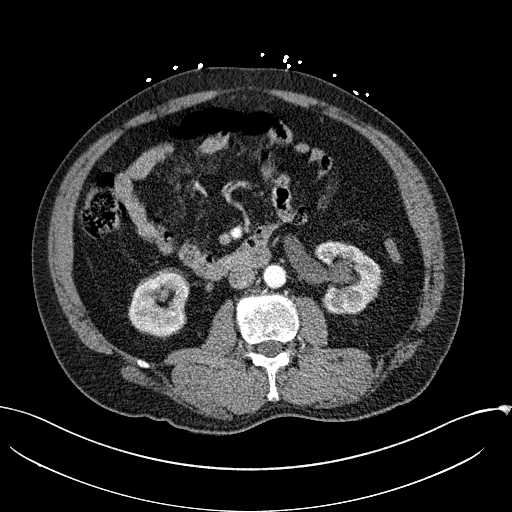
[im 113/212  soft-tissue]
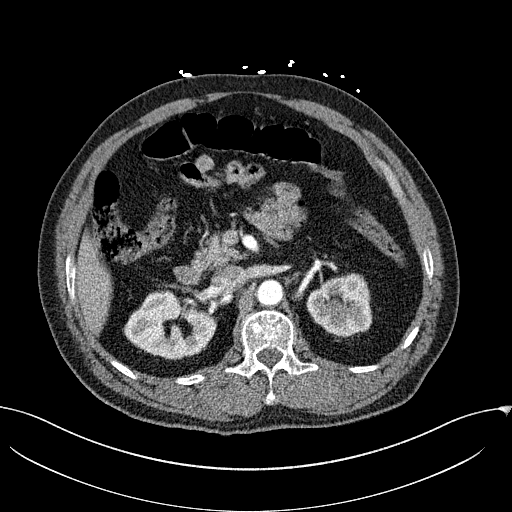
[im 141/212  soft-tissue]
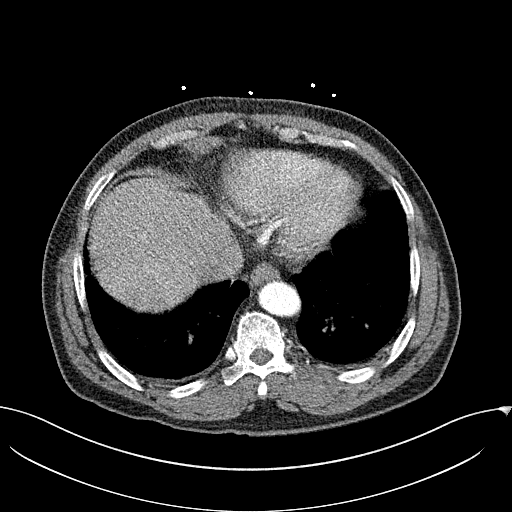
[im 155/212  soft-tissue]
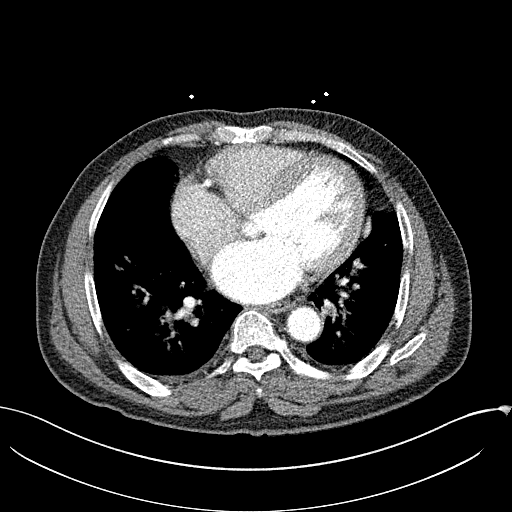
[im 169/212  soft-tissue]
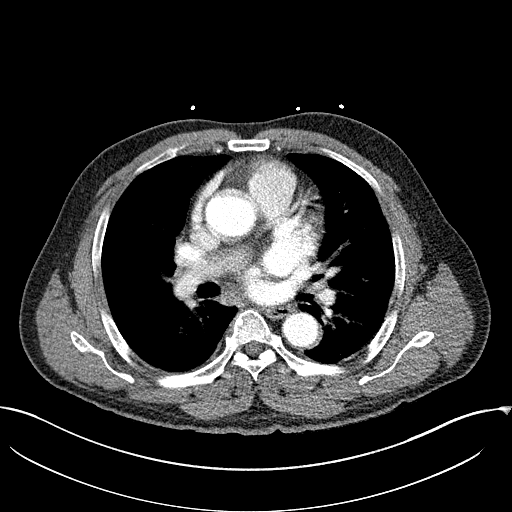
[im 169/212  bone]
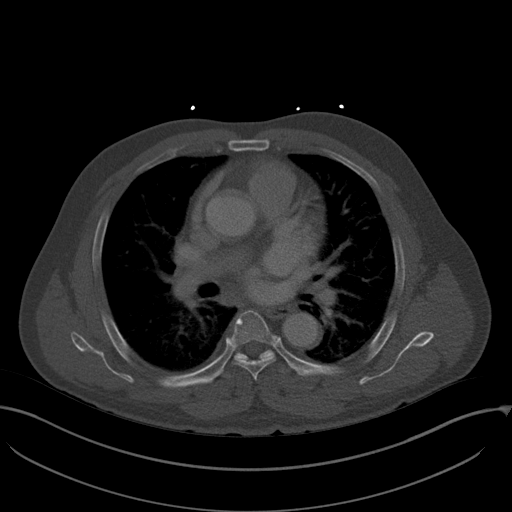
[im 197/212  soft-tissue]
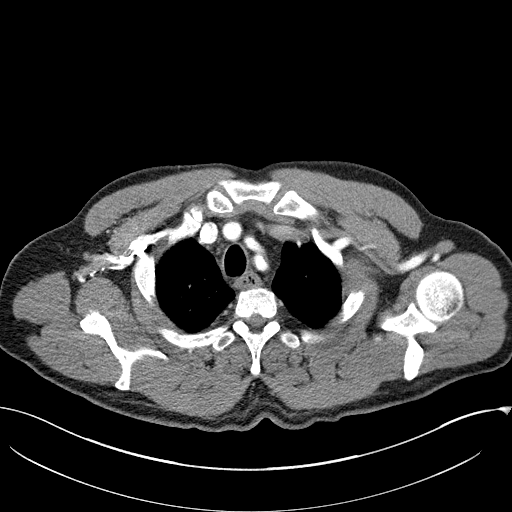

[Series 10: coronals · coronal · 0.73mm/px · 3 of 165 slices shown]
[im 42/165  soft-tissue]
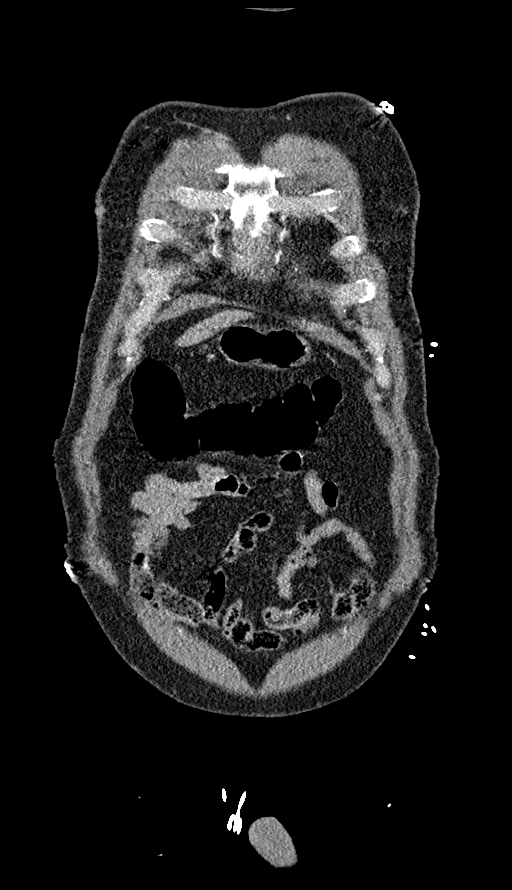
[im 83/165  soft-tissue]
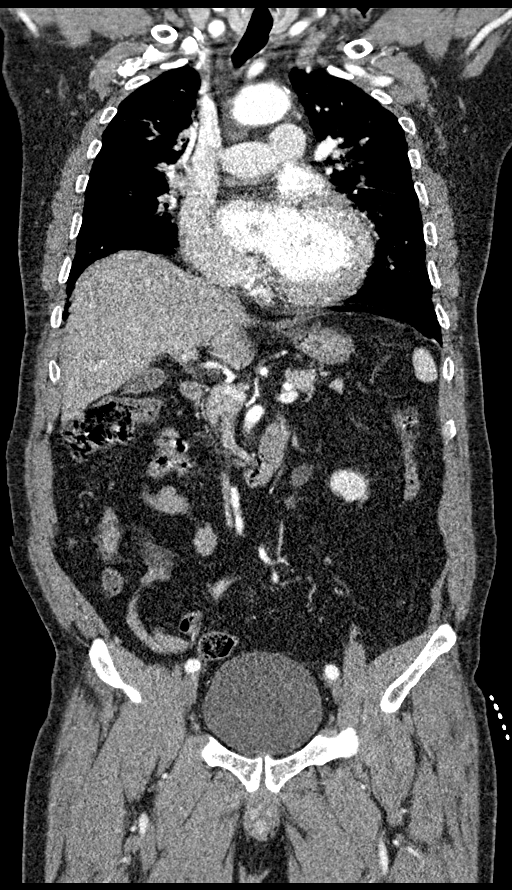
[im 124/165  soft-tissue]
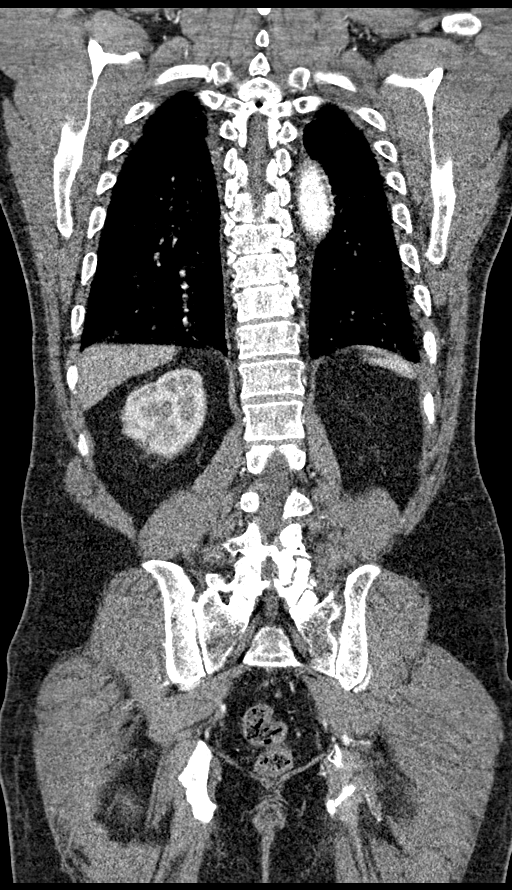

[13 of 46 positions shown; findings below may reference images not displayed]

FINDINGS: CTA CHEST FINDINGS

Cardiovascular: Atherosclerosis of the thoracic aorta. Negative for
significant dissection, aneurysm, or hyperdense intramural hematoma.
Patent tortuous three-vessel arch anatomy. No mediastinal hemorrhage
or hematoma. Central pulmonary arteries appear patent. Is mildly
enlarged. No pericardial effusion.

Mediastinum/Nodes: Thyroid mildly heterogeneous. Trachea and central
airways are patent. Esophagus is nondilated. No significant hernia.

No adenopathy.

Lungs/Pleura: Upper lobe interlobular septal thickening. Diffuse
respiratory motion artifact. Mild scattered ground-glass opacity
throughout both lungs. Low lung volumes with basilar vascular
crowding. Appearance suggest early interstitial edema. No definite
focal acute airspace process, collapse or consolidation. No
interstitial lung disease. No pleural abnormality, effusion, or
pneumothorax.

Musculoskeletal: No acute osseous finding. No compression fracture.
Sternum intact.

Review of the MIP images confirms the above findings.

CTA ABDOMEN AND PELVIS FINDINGS

VASCULAR

Aorta: Abdominal atherosclerosis and mild tortuosity without
aneurysm, dissection, occlusive process or acute vascular finding.
No retroperitoneal hemorrhage or hematoma.

Celiac: Widely patent including its branches

SMA: Widely patent including its branches

Renals: Both main renal arteries are patent.

IMA: Remains patent off the distal aorta including its branches

Inflow: Atherosclerosis and tortuosity of the iliac vessels. Iliac
vessels remain patent without inflow disease. The common, internal
and external iliac arteries are all patent.

Veins: Venous phase imaging not performed.

Review of the MIP images confirms the above findings.

NON-VASCULAR

Hepatobiliary: No focal liver abnormality is seen. No gallstones,
gallbladder wall thickening, or biliary dilatation.

Pancreas: Unremarkable. No pancreatic ductal dilatation or
surrounding inflammatory changes.

Spleen: Normal in size without focal abnormality.

Adrenals/Urinary Tract: Normal adrenal glands.

Right kidney and ureter demonstrate no acute process, obstructive
uropathy, hydroureter, or ureteral calculus.

Bladder unremarkable.

Left kidney demonstrates chronic appearing mild to moderate
hydronephrosis and proximal hydroureter without obstructing
radiopaque left ureteral calculus.

Stomach/Bowel: Negative for bowel obstruction, significant
dilatation, ileus, or free air. Appendix not visualized. No acute
inflammatory process, fluid collection, or abscess.

Lymphatic: No adenopathy

Reproductive: Unremarkable

Other: No abdominal wall hernia or abnormality. No abdominopelvic
ascites.

Musculoskeletal: Degenerative changes of the spine. No acute osseous
finding.

Review of the MIP images confirms the above findings.
IMPRESSION: Thoraco abdominal aortic atherosclerosis without acute dissection,
significant aneurysm, occlusive disease or other acute vascular
process.

Cardiomegaly with mild pulmonary interstitial edema pattern
suggesting early CHF or volume overload.

Mild-to-moderate left hydroureteronephrosis without obstructing left
ureteral calculus appearing more chronic than acute without
surrounding perinephric edema. Consider nonemergent urology
follow-up

No other acute intra-abdominal or pelvic finding.

## 2020-06-30 IMAGING — DX DG CHEST 1V PORT
1 series · 1 of 1 positions shown · non-contrast
Comparison: Radiographs and CT 12/17/2017

CLINICAL DATA: Respiratory distress.  Stridor.  Fever.

EXAM:
PORTABLE CHEST 1 VIEW

[chest]
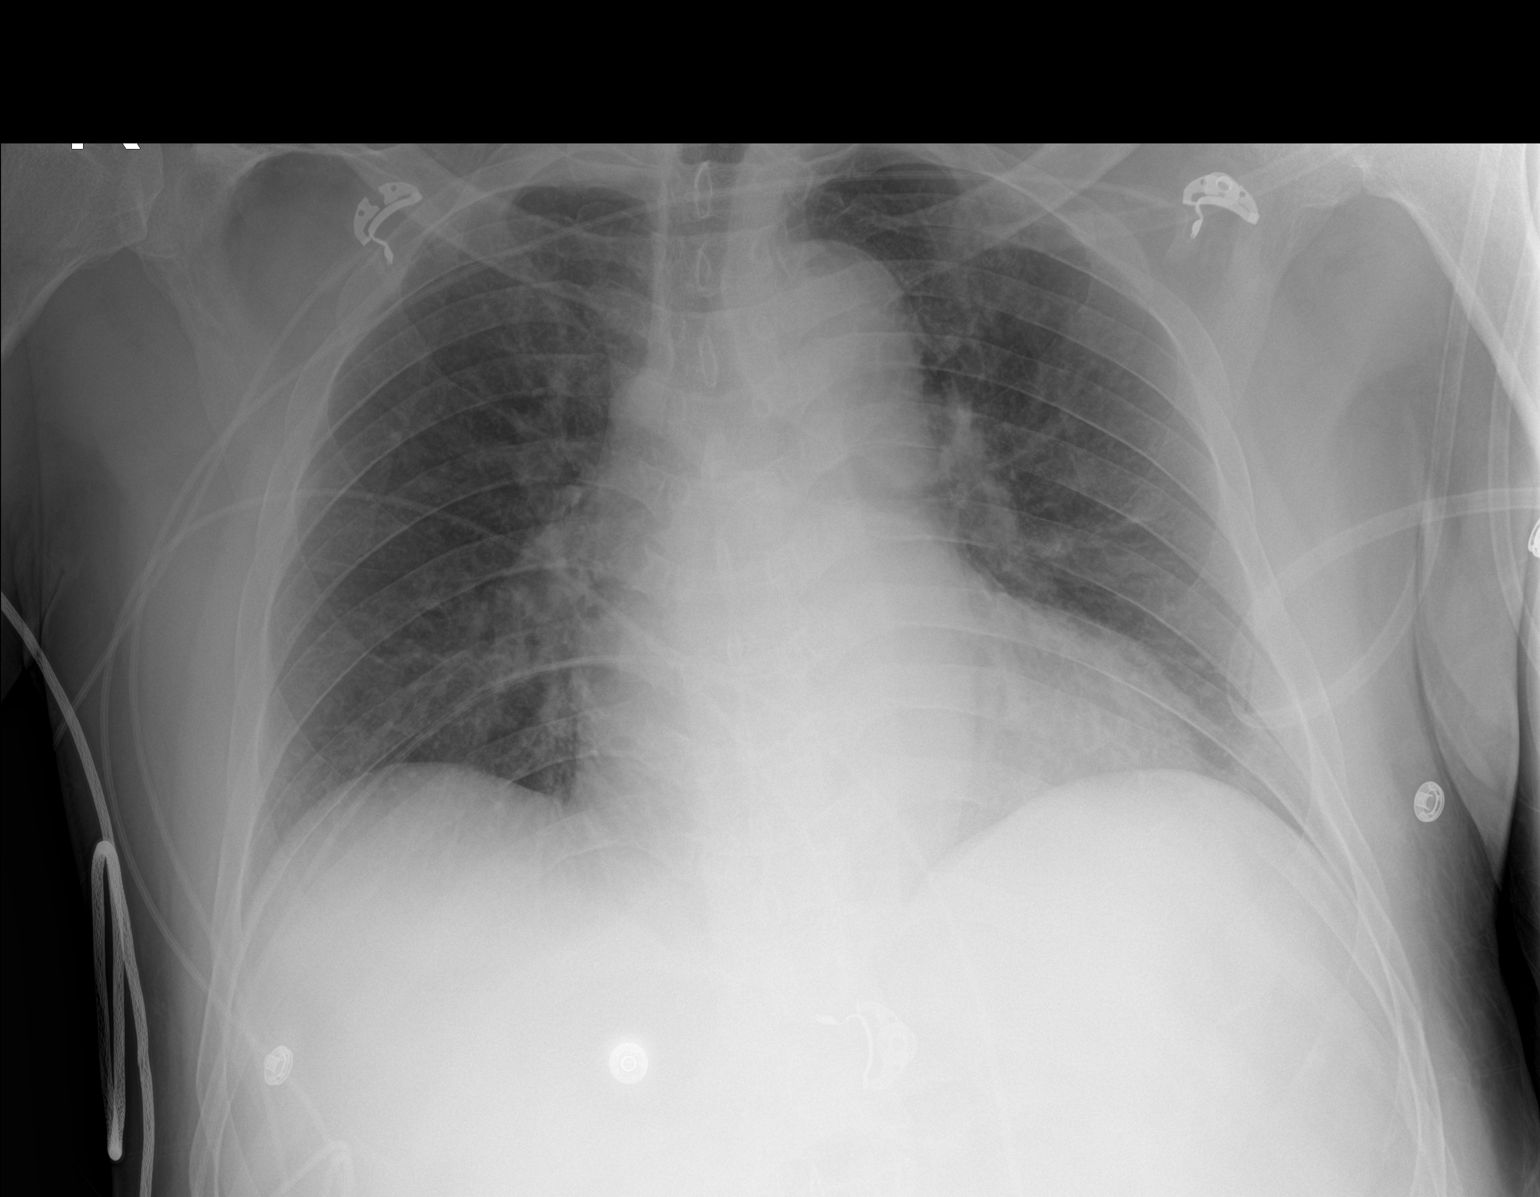

[1 of 1 positions shown; findings below may reference images not displayed]

FINDINGS: Lower lung volumes from prior exam. Cardiomegaly appears similar
allowing for differences in technique. Improved pulmonary edema. No
focal airspace disease. No pleural effusion or pneumothorax.
Unchanged osseous structures.
IMPRESSION: 1. Resolved CHF.  Mild cardiomegaly persists.
2. No focal airspace disease.

## 2020-09-22 DIAGNOSIS — Z20822 Contact with and (suspected) exposure to covid-19: Secondary | ICD-10-CM | POA: Diagnosis not present

## 2021-04-26 DIAGNOSIS — Z20822 Contact with and (suspected) exposure to covid-19: Secondary | ICD-10-CM | POA: Diagnosis not present

## 2021-05-18 DIAGNOSIS — Z20822 Contact with and (suspected) exposure to covid-19: Secondary | ICD-10-CM | POA: Diagnosis not present

## 2021-06-22 DIAGNOSIS — Z20822 Contact with and (suspected) exposure to covid-19: Secondary | ICD-10-CM | POA: Diagnosis not present

## 2021-06-29 DIAGNOSIS — Z20822 Contact with and (suspected) exposure to covid-19: Secondary | ICD-10-CM | POA: Diagnosis not present

## 2021-10-04 ENCOUNTER — Encounter (HOSPITAL_COMMUNITY): Payer: Self-pay | Admitting: Internal Medicine

## 2021-10-04 ENCOUNTER — Inpatient Hospital Stay (HOSPITAL_COMMUNITY): Payer: Medicare Other

## 2021-10-04 ENCOUNTER — Inpatient Hospital Stay (HOSPITAL_COMMUNITY)
Admission: EM | Admit: 2021-10-04 | Discharge: 2021-10-09 | DRG: 291 | Disposition: A | Discharge status: Home Health | Payer: Medicare Other | Attending: Internal Medicine | Admitting: Internal Medicine

## 2021-10-04 ENCOUNTER — Other Ambulatory Visit: Payer: Self-pay

## 2021-10-04 ENCOUNTER — Emergency Department (HOSPITAL_COMMUNITY): Payer: Medicare Other

## 2021-10-04 DIAGNOSIS — N1330 Unspecified hydronephrosis: Secondary | ICD-10-CM | POA: Diagnosis present

## 2021-10-04 DIAGNOSIS — E0590 Thyrotoxicosis, unspecified without thyrotoxic crisis or storm: Secondary | ICD-10-CM | POA: Diagnosis present

## 2021-10-04 DIAGNOSIS — Z91148 Patient's other noncompliance with medication regimen for other reason: Secondary | ICD-10-CM | POA: Diagnosis not present

## 2021-10-04 DIAGNOSIS — I110 Hypertensive heart disease with heart failure: Secondary | ICD-10-CM | POA: Diagnosis not present

## 2021-10-04 DIAGNOSIS — I420 Dilated cardiomyopathy: Secondary | ICD-10-CM | POA: Diagnosis present

## 2021-10-04 DIAGNOSIS — I5023 Acute on chronic systolic (congestive) heart failure: Secondary | ICD-10-CM

## 2021-10-04 DIAGNOSIS — I509 Heart failure, unspecified: Secondary | ICD-10-CM | POA: Diagnosis not present

## 2021-10-04 DIAGNOSIS — N1831 Chronic kidney disease, stage 3a: Secondary | ICD-10-CM | POA: Diagnosis present

## 2021-10-04 DIAGNOSIS — Z888 Allergy status to other drugs, medicaments and biological substances status: Secondary | ICD-10-CM

## 2021-10-04 DIAGNOSIS — I130 Hypertensive heart and chronic kidney disease with heart failure and stage 1 through stage 4 chronic kidney disease, or unspecified chronic kidney disease: Secondary | ICD-10-CM | POA: Diagnosis present

## 2021-10-04 DIAGNOSIS — Z20822 Contact with and (suspected) exposure to covid-19: Secondary | ICD-10-CM | POA: Diagnosis present

## 2021-10-04 DIAGNOSIS — Z79899 Other long term (current) drug therapy: Secondary | ICD-10-CM

## 2021-10-04 DIAGNOSIS — I248 Other forms of acute ischemic heart disease: Secondary | ICD-10-CM | POA: Diagnosis present

## 2021-10-04 DIAGNOSIS — E1122 Type 2 diabetes mellitus with diabetic chronic kidney disease: Secondary | ICD-10-CM | POA: Diagnosis present

## 2021-10-04 DIAGNOSIS — I5043 Acute on chronic combined systolic (congestive) and diastolic (congestive) heart failure: Secondary | ICD-10-CM | POA: Diagnosis present

## 2021-10-04 DIAGNOSIS — R778 Other specified abnormalities of plasma proteins: Secondary | ICD-10-CM | POA: Diagnosis not present

## 2021-10-04 DIAGNOSIS — I160 Hypertensive urgency: Secondary | ICD-10-CM | POA: Diagnosis not present

## 2021-10-04 DIAGNOSIS — I5021 Acute systolic (congestive) heart failure: Secondary | ICD-10-CM | POA: Diagnosis not present

## 2021-10-04 DIAGNOSIS — E785 Hyperlipidemia, unspecified: Secondary | ICD-10-CM | POA: Diagnosis present

## 2021-10-04 DIAGNOSIS — N179 Acute kidney failure, unspecified: Secondary | ICD-10-CM | POA: Diagnosis present

## 2021-10-04 DIAGNOSIS — R0789 Other chest pain: Secondary | ICD-10-CM | POA: Diagnosis not present

## 2021-10-04 DIAGNOSIS — I10 Essential (primary) hypertension: Secondary | ICD-10-CM | POA: Diagnosis present

## 2021-10-04 DIAGNOSIS — N183 Chronic kidney disease, stage 3 unspecified: Secondary | ICD-10-CM

## 2021-10-04 DIAGNOSIS — R0602 Shortness of breath: Secondary | ICD-10-CM | POA: Diagnosis not present

## 2021-10-04 LAB — URINALYSIS, ROUTINE W REFLEX MICROSCOPIC
Bacteria, UA: NONE SEEN
Bilirubin Urine: NEGATIVE
Glucose, UA: NEGATIVE mg/dL
Ketones, ur: NEGATIVE mg/dL
Leukocytes,Ua: NEGATIVE
Nitrite: NEGATIVE
Protein, ur: NEGATIVE mg/dL
Specific Gravity, Urine: 1.006 (ref 1.005–1.030)
pH: 6 (ref 5.0–8.0)

## 2021-10-04 LAB — SARS CORONAVIRUS 2 BY RT PCR: SARS Coronavirus 2 by RT PCR: NEGATIVE

## 2021-10-04 LAB — BASIC METABOLIC PANEL
Anion gap: 9 (ref 5–15)
Anion gap: 12 (ref 5–15)
BUN: 21 mg/dL (ref 8–23)
BUN: 22 mg/dL (ref 8–23)
CO2: 25 mmol/L (ref 22–32)
CO2: 25 mmol/L (ref 22–32)
Calcium: 8.8 mg/dL — ABNORMAL LOW (ref 8.9–10.3)
Calcium: 9.1 mg/dL (ref 8.9–10.3)
Chloride: 107 mmol/L (ref 98–111)
Chloride: 101 mmol/L (ref 98–111)
Creatinine, Ser: 1.93 mg/dL — ABNORMAL HIGH (ref 0.61–1.24)
Creatinine, Ser: 1.8 mg/dL — ABNORMAL HIGH (ref 0.61–1.24)
GFR, Estimated: 37 mL/min — ABNORMAL LOW (ref >60)
GFR, Estimated: 40 mL/min — ABNORMAL LOW (ref >60)
Glucose, Bld: 102 mg/dL — ABNORMAL HIGH (ref 70–99)
Glucose, Bld: 139 mg/dL — ABNORMAL HIGH (ref 70–99)
Potassium: 4.1 mmol/L (ref 3.5–5.1)
Potassium: 3.4 mmol/L — ABNORMAL LOW (ref 3.5–5.1)
Sodium: 141 mmol/L (ref 135–145)
Sodium: 138 mmol/L (ref 135–145)

## 2021-10-04 LAB — CBC WITH DIFFERENTIAL/PLATELET
Abs Immature Granulocytes: 0.04 10*3/uL (ref 0.00–0.07)
Basophils Absolute: 0.1 10*3/uL (ref 0.0–0.1)
Basophils Relative: 1 %
Eosinophils Absolute: 0.3 10*3/uL (ref 0.0–0.5)
Eosinophils Relative: 3 %
HCT: 41.6 % (ref 39.0–52.0)
Hemoglobin: 13.3 g/dL (ref 13.0–17.0)
Immature Granulocytes: 1 %
Lymphocytes Relative: 25 %
Lymphs Abs: 2.1 10*3/uL (ref 0.7–4.0)
MCH: 27.7 pg (ref 26.0–34.0)
MCHC: 32 g/dL (ref 30.0–36.0)
MCV: 86.5 fL (ref 80.0–100.0)
Monocytes Absolute: 0.7 10*3/uL (ref 0.1–1.0)
Monocytes Relative: 8 %
Neutro Abs: 5.3 10*3/uL (ref 1.7–7.7)
Neutrophils Relative %: 62 %
Platelets: 165 10*3/uL (ref 150–400)
RBC: 4.81 MIL/uL (ref 4.22–5.81)
RDW: 14.6 % (ref 11.5–15.5)
WBC: 8.5 10*3/uL (ref 4.0–10.5)
nRBC: 0 % (ref 0.0–0.2)

## 2021-10-04 LAB — LIPID PANEL
Cholesterol: 206 mg/dL — ABNORMAL HIGH (ref 0–200)
HDL: 52 mg/dL (ref >40)
LDL Cholesterol: 141 mg/dL — ABNORMAL HIGH (ref 0–99)
Total CHOL/HDL Ratio: 4 RATIO
Triglycerides: 67 mg/dL (ref <150)
VLDL: 13 mg/dL (ref 0–40)

## 2021-10-04 LAB — HEMOGLOBIN A1C
Hgb A1c MFr Bld: 6.4 % — ABNORMAL HIGH (ref 4.8–5.6)
Mean Plasma Glucose: 136.98 mg/dL

## 2021-10-04 LAB — TROPONIN I (HIGH SENSITIVITY)
Troponin I (High Sensitivity): 58 ng/L — ABNORMAL HIGH (ref <18)
Troponin I (High Sensitivity): 55 ng/L — ABNORMAL HIGH (ref <18)

## 2021-10-04 LAB — MAGNESIUM: Magnesium: 2 mg/dL (ref 1.7–2.4)

## 2021-10-04 LAB — BRAIN NATRIURETIC PEPTIDE: B Natriuretic Peptide: 1637.7 pg/mL — ABNORMAL HIGH (ref 0.0–100.0)

## 2021-10-04 MED ORDER — POLYETHYLENE GLYCOL 3350 17 G PO PACK
17.0000 g | PACK | Freq: Every day | ORAL | Status: DC | PRN
Start: 2021-10-04 — End: 2021-10-07

## 2021-10-04 MED ORDER — NITROGLYCERIN IN D5W 200-5 MCG/ML-% IV SOLN
0.0000 ug/min | INTRAVENOUS | Status: DC
  Administered 2021-10-04: 5 ug/min via INTRAVENOUS
  Filled 2021-10-04: qty 250

## 2021-10-04 MED ORDER — HYDRALAZINE HCL 20 MG/ML IJ SOLN
5.0000 mg | INTRAMUSCULAR | Status: DC | PRN
Start: 2021-10-04 — End: 2021-10-07

## 2021-10-04 MED ORDER — ACETAMINOPHEN 650 MG RE SUPP: 650.0000 mg | Freq: Four times a day (QID) | RECTAL | Status: DC | PRN

## 2021-10-04 MED ORDER — FUROSEMIDE 10 MG/ML IJ SOLN
80.0000 mg | Freq: Once | INTRAMUSCULAR | Status: AC
  Administered 2021-10-04: 80 mg via INTRAVENOUS
  Filled 2021-10-04: qty 8

## 2021-10-04 MED ORDER — HYDRALAZINE HCL 50 MG PO TABS
100.0000 mg | ORAL_TABLET | Freq: Three times a day (TID) | ORAL | Status: DC
  Administered 2021-10-04 – 2021-10-05 (×5): 100 mg via ORAL
  Filled 2021-10-04 (×6): qty 2

## 2021-10-04 MED ORDER — DOCUSATE SODIUM 100 MG PO CAPS
100.0000 mg | ORAL_CAPSULE | Freq: Two times a day (BID) | ORAL | Status: DC
  Administered 2021-10-05 – 2021-10-08 (×7): 100 mg via ORAL
  Filled 2021-10-04 (×10): qty 1

## 2021-10-04 MED ORDER — CARVEDILOL 12.5 MG PO TABS
12.5000 mg | ORAL_TABLET | Freq: Two times a day (BID) | ORAL | Status: DC
Start: 2021-10-04 — End: 2021-10-07
  Administered 2021-10-04 – 2021-10-07 (×6): 12.5 mg via ORAL
  Filled 2021-10-04 (×6): qty 1

## 2021-10-04 MED ORDER — ASPIRIN 81 MG PO TBEC
81.0000 mg | DELAYED_RELEASE_TABLET | Freq: Every day | ORAL | Status: DC
  Administered 2021-10-04 – 2021-10-09 (×6): 81 mg via ORAL
  Filled 2021-10-04 (×6): qty 1

## 2021-10-04 MED ORDER — OXYCODONE HCL 5 MG PO TABS
5.0000 mg | ORAL_TABLET | ORAL | Status: DC | PRN
  Administered 2021-10-04 – 2021-10-05 (×2): 5 mg via ORAL
  Filled 2021-10-04 (×2): qty 1

## 2021-10-04 MED ORDER — TRAZODONE HCL 50 MG PO TABS
25.0000 mg | ORAL_TABLET | Freq: Every evening | ORAL | Status: DC | PRN
  Administered 2021-10-07: 25 mg via ORAL
  Filled 2021-10-04: qty 1

## 2021-10-04 MED ORDER — ONDANSETRON HCL 4 MG PO TABS: 4.0000 mg | ORAL_TABLET | Freq: Four times a day (QID) | ORAL | Status: DC | PRN

## 2021-10-04 MED ORDER — ATORVASTATIN CALCIUM 10 MG PO TABS
10.0000 mg | ORAL_TABLET | Freq: Every day | ORAL | Status: DC
  Administered 2021-10-05 – 2021-10-09 (×5): 10 mg via ORAL
  Filled 2021-10-04 (×5): qty 1

## 2021-10-04 MED ORDER — ENOXAPARIN SODIUM 40 MG/0.4ML IJ SOSY
40.0000 mg | PREFILLED_SYRINGE | Freq: Every day | INTRAMUSCULAR | Status: DC
  Administered 2021-10-05 – 2021-10-06 (×2): 40 mg via SUBCUTANEOUS
  Filled 2021-10-04 (×2): qty 0.4

## 2021-10-04 MED ORDER — BISACODYL 5 MG PO TBEC: 5.0000 mg | DELAYED_RELEASE_TABLET | Freq: Every day | ORAL | Status: DC | PRN

## 2021-10-04 MED ORDER — ACETAMINOPHEN 325 MG PO TABS
650.0000 mg | ORAL_TABLET | Freq: Four times a day (QID) | ORAL | Status: DC | PRN
  Administered 2021-10-08 (×2): 650 mg via ORAL
  Filled 2021-10-04 (×2): qty 2

## 2021-10-04 MED ORDER — ONDANSETRON HCL 4 MG/2ML IJ SOLN: 4.0000 mg | Freq: Four times a day (QID) | INTRAMUSCULAR | Status: DC | PRN

## 2021-10-04 MED ORDER — SODIUM CHLORIDE 0.9% FLUSH
3.0000 mL | Freq: Two times a day (BID) | INTRAVENOUS | Status: DC
  Administered 2021-10-04 – 2021-10-09 (×11): 3 mL via INTRAVENOUS

## 2021-10-04 MED ORDER — HYDRALAZINE HCL 20 MG/ML IJ SOLN
20.0000 mg | Freq: Once | INTRAMUSCULAR | Status: AC
  Administered 2021-10-04: 20 mg via INTRAVENOUS
  Filled 2021-10-04: qty 1

## 2021-10-04 MED ORDER — MORPHINE SULFATE (PF) 2 MG/ML IV SOLN
2.0000 mg | INTRAVENOUS | Status: DC | PRN
  Administered 2021-10-04: 2 mg via INTRAVENOUS
  Filled 2021-10-04: qty 1

## 2021-10-04 MED ORDER — FUROSEMIDE 10 MG/ML IJ SOLN
40.0000 mg | Freq: Two times a day (BID) | INTRAMUSCULAR | Status: DC
  Administered 2021-10-04 – 2021-10-05 (×3): 40 mg via INTRAVENOUS
  Filled 2021-10-04 (×3): qty 4

## 2021-10-04 MED ORDER — MAGNESIUM SULFATE 2 GM/50ML IV SOLN
2.0000 g | Freq: Once | INTRAVENOUS | Status: AC
  Administered 2021-10-04: 2 g via INTRAVENOUS
  Filled 2021-10-04: qty 50

## 2021-10-04 NOTE — Progress Notes (Signed)
Heart Failure Navigator Progress Note  Following this hospitalization to assess for HV TOC readiness.   ECHO ordered Last EF 50-55% ( 03/2018) BNP 1,637 Stopped taking medication 4 months ago  Earnestine Leys, Copywriter, advertising, Clinical cytogeneticist Only

## 2021-10-04 NOTE — ED Provider Triage Note (Signed)
  Emergency Medicine Provider Triage Evaluation Note  MRN:  [id number]  Arrival date & time: 10/04/21    Medically screening exam initiated at 2:10 AM.   CC:   SOB  HPI:  Daniel Obrien is a 71 y.o. year-old male presents to the ED with chief complaint of SOB.  Has been worsening for the past few days.  Denies leg swelling, but there is swelling noted on exam.  HX of CHF.  Son says he's not taking medications.  History provided by patient. ROS:  -As included in HPI PE:   Vitals:   10/04/21 0157  BP: (!) 201/147  Pulse: (!) 59  Resp: (!) 24  Temp: 98 F (36.7 C)  SpO2: 95%    Non-toxic appearing No respiratory distress 2+ lower extremity edema MDM:  Based on signs and symptoms, CHF exacerbation is highest on my differential. I've ordered labs in triage to expedite lab/diagnostic workup.  Patient was informed that the remainder of the evaluation will be completed by another provider, this initial triage assessment does not replace that evaluation, and the importance of remaining in the ED until their evaluation is complete.    Montine Circle, PA-C 10/04/21 [phone]

## 2021-10-04 NOTE — ED Triage Notes (Signed)
Pt here from home for shob that has been going on for a week but got significantly worse today. Reports chest tightness, denies any injuries, fevers/nausea.

## 2021-10-04 NOTE — H&P (Signed)
History and Physical    Patient: Daniel [facility]rien RKY:[id number] DOB: Jul 17, 1950 DOA: 10/04/2021 DOS: the patient was seen and examined on 10/04/2021 PCP: [facility]  Patient coming from: Home - lives with wife, son, and his family; NOK: Son, [phone]   Chief Complaint: SOB  HPI: Daniel [facility]rien is a 71 y.o. male with medical history significant of chronic systolic CHF; DM; HTN; and hyperthyroidism presenting with SOB.  This is the 2nd occurrence.  He has been having difficulty breathing, previously diagnosed with CHF.  He has not been going to the bathroom, stopped taking medications several months ago.  No fever.  Some cough, nonproductive.  No sick contacts.  He is not sure about his weight, no apparent edema.  Recent orthopnea.  +PND.   Family reported that he has not had meds in maybe 3-4 months, but pharmacy indicates his last fills were >1 year ago.    ER Course:   Not taking meds x 4 months.  Increasing SOB, BP 200.  CHF on xray.  EF 30 -> 50 in 2021.  Troponin 58, 55.  On NTG drip, given Lasix and hydralazine.     Review of Systems: As mentioned in the history of present illness. All other systems reviewed and are negative. Past Medical History:  Diagnosis Date   Acute CHF (congestive heart failure) ([place]) 12/17/2017   ARF (acute renal failure) ([place]) 12/17/2017   Cardiomyopathy ([place]), new, EF 30-35% 12/18/2017   Chest pain 12/17/2017   Demand ischemia ([place])    Diabetes ([place])    Elevated troponin 12/18/2017   Hydroureteronephrosis, left, mild to moderate 12/18/2017   Hypertension    Hyperthyroidism 12/18/2017   Penile pain 12/18/2017   Past Surgical History:  Procedure Laterality Date   NOSE SURGERY     Social History:  reports that he has never smoked. He has never used smokeless tobacco. He reports that he does not drink alcohol and does not use drugs.  Allergies  Allergen Reactions   Heparin Shortness Of Breath and Other (See Comments)    Patient family  stated when heparin is given patient starts shaking, wheezing. [facility] notified.    Family History  Problem Relation Age of Onset   Unexplained death Mother 24   Unexplained death Father 109   Hypertension Neg Hx    Heart disease Neg Hx     Prior to Admission medications   Medication Sig Start Date End Date Taking? Authorizing Provider  atorvastatin (LIPITOR) 10 MG tablet Take 10 mg by mouth daily.    [provider]  carvedilol (COREG) 12.5 MG tablet Take 1 tablet (12.5 mg total) by mouth 2 (two) times daily with a meal. Please make yearly appt with Dr. Meda Coffee for January before anymore refills. 1st attempt 02/09/19   Jerline Pain, MD  furosemide (LASIX) 20 MG tablet Take 20 mg by mouth daily.    [provider]  hydrALAZINE (APRESOLINE) 100 MG tablet TAKE 1 TABLET BY MOUTH THREE TIMES DAILY 04/12/19   Burtis Junes, NP  isosorbide mononitrate (IMDUR) 60 MG 24 hr tablet Take 1 tablet (60 mg total) by mouth daily. Please make yearly appt with Dr. Meda Coffee for January before anymore refills. 1st attempt 02/09/19   Jerline Pain, MD  valsartan (DIOVAN) 160 MG tablet Take 1 tablet (160 mg total) by mouth daily. 06/08/19   Imogene Burn, PA-C    Physical Exam: Vitals:   10/04/21 1412 10/04/21 1438 10/04/21 1457 10/04/21  1553  BP:  (!) 169/96 137/69 128/68  Pulse:  (!) 101 96 87  Resp:  18  20  Temp: 98 F (36.7 C) (!) 97.5 F (36.4 C)  98.2 F (36.8 C)  TempSrc: Oral Oral  Oral  SpO2:  97%  97%  Weight:  80.2 kg    Height:  '5\' 7"'$  (1.702 m)     General:  Appears calm and comfortable and is in NAD, mildly flushed, on NTG drip Eyes:   EOMI, normal lids, iris ENT:  grossly normal hearing, lips & tongue, mmm Neck:  no LAD, masses or thyromegaly Cardiovascular:  RRR, no m/r/g. Tr LE edema.  Respiratory:   CTA bilaterally with no wheezes/rales/rhonchi.  Normal respiratory effort. Abdomen:  soft, NT, ND Skin:  no rash or induration seen on limited  exam Musculoskeletal:  grossly normal tone BUE/BLE, good ROM, no bony abnormality Lower extremity:  No LE edema.  Limited foot exam with no ulcerations.  2+ distal pulses. Psychiatric:  blunted mood and affect, speech fluent in [place] dialect Neurologic:  CN 2-12 grossly intact, moves all extremities in coordinated fashion   Radiological Exams on Admission: Independently reviewed - see discussion in A/P where applicable  DG Chest Portable 1 View  Result Date: 10/04/2021 CLINICAL DATA:  Shortness of breath, chest tightness. EXAM: PORTABLE CHEST 1 VIEW COMPARISON:  04/26/2019. FINDINGS: The heart is enlarged the mediastinal contour is stable. The pulmonary vasculature is mildly distended. Perihilar interstitial prominence is noted bilaterally. No consolidation, effusion, or pneumothorax. No acute osseous abnormality. IMPRESSION: 1. Cardiomegaly with pulmonary vascular congestion. 2. Perihilar interstitial thickening bilaterally, possible edema or infiltrate. Electronically Signed   By: Brett Fairy M.D.   On: 10/04/2021 02:31    EKG: Independently reviewed.  NSR with rate 72; LVH, IVCD; +PVCs; nonspecific ST changes with no evidence of acute ischemia   Labs on Admission: I have personally reviewed the available labs and imaging studies at the time of the admission.  Pertinent labs:    BUN 21/Creatinine 1.93/GFR 37; 26/1.71/41 in 2021 BNP 1637.7; 1450.7 HS troponin 58, 55 Normal CBC   Assessment and Plan: Principal Problem:   Acute on chronic systolic (congestive) heart failure (HCC)    Acute on chronic systolic CHF -Patient with prior admission for HTN emergency in 2019 presenting with SOB, uncontrolled HTN -12/2017 echo with EF 30% with global hypokinesis; repeat echo in 03/2018 showed recovered EF with severe LVH and grade 2 diastolic dysfunction -He stopped taking all medications perhaps a year ago -CXR consistent with mild pulmonary edema -Significantly elevated BNP  -With  elevated BNP and abnl CXR, acute decompensated CHF seems probable as diagnosis -Will admit to progressive care, as per the Emergency HF Mortality Risk Grade.  The patient has: hemodynamic instability -Will request echocardiogram -Will start ASA -ACE/ARB, beta blocker, and spironolactone are recommended as per guideline-directed medical therapy to reduce morbidity/mortality; renal function will not currently support ACE/ARB and he has decompensated CHF so will not yet resume carvedilol -CHF order set utilized -Cardiology consulted -Was given Lasix 40 mg x 1 in ER and will repeat with 40 mg IV BID -Continue [place] O2 prn for now -Elevated troponin with negative delta is likely related to demand ischemia -Check A1c and TSH  HTN -Patient was started on a NTG drip upon arrival in the ER and this is being weaned as tolerated -PO hydralazine has been resumed -Will need BB once able to tolerate -Will also add prn hydralazine  HLD -Resume Lipitor -  Check lipids  Urinary retention -Patient with c/o inability to void upon arrival on the floor (made plenty of urine in the ER) -Bladder scan done with 340 cc; I/O cath done with 450 cc return -He is now complaining of inner thigh cramping -Will recheck bladder scan -Cardiology checked a Mag++ level and ordered a Mag++ infusion -May need foley    Advance Care Planning:   Code Status: Full Code   Consults: Cardiology  DVT Prophylaxis: Lovenox (reported SOB with heparin but pharmacy thought it reasonable to give)  Family Communication: Wife, son, and daughter-in-law were present throughout evaluation  Severity of Illness: The appropriate patient status for this patient is INPATIENT. Inpatient status is judged to be reasonable and necessary in order to provide the required intensity of service to ensure the patient's safety. The patient's presenting symptoms, physical exam findings, and initial radiographic and laboratory data in the context of  their chronic comorbidities is felt to place them at high risk for further clinical deterioration. Furthermore, it is not anticipated that the patient will be medically stable for discharge from the hospital within 2 midnights of admission.   * I certify that at the point of admission it is my clinical judgment that the patient will require inpatient hospital care spanning beyond 2 midnights from the point of admission due to high intensity of service, high risk for further deterioration and high frequency of surveillance required.*  Author: Karmen Bongo, MD 10/04/2021 5:31 PM  For on call review [email or web address].

## 2021-10-04 NOTE — Consult Note (Addendum)
Cardiology Consultation:   Patient ID: Daniel Obrien MRN: [id number]; DOB: [birth year]  Admit date: 10/04/2021 Date of Consult: 10/04/2021  PCP:  Pa, [facility] Providers Cardiologist:  None     Patient Profile:   Daniel [facility] is a 71 y.o. male with a hx of chronic combined systolic and diastolic CHF (with improved EF), DM, HTN, SOB who is being seen 10/04/2021 for the evaluation of CHF at the request of Dr. Lorin Mercy.  History of Present Illness:   Daniel Obrien is a 71 year old male with above medical history.  Per chart review, patient first established care with cardiology in 2019.  Patient was admitted to the [facility] in 12/2017 for evaluation of hypertensive urgency, chest pain.  Underwent echocardiogram on 01/14/2018 that showed EF 30% with diffuse hypokinesis, severely dilated cavity.  There is also mild AI, mild-moderate MR, severe dilation of left atrium, mild dilation of the right ventricle, mild dilation of the right atrium.  Patient was fluid overloaded, attempted diuresis with Lasix however creatinine increased from 1.4 to-2.1. Found to have mild left hydronephrosis. Due to his kidney function, plan was to optimize medical management and reevaluate LV and RV function with echo after a few months (prior to proceeding with invasive ischemic eval.)  Repeat echocardiogram on 03/23/2018 showed that EF improved to 50-55%, no wall motion abnormalities, grade II diastolic dysfunction, severe LVH.    Patient was seen in the clinic on 06/08/2019 complaining of dyspnea on exertion and was found to have elevated BP. Adjusted home medications and encouraged low sodium diet. Was seen in follow up 4 weeks later on 07/14/2019 that and was doing well at that time.   Patient does not appear to have any recent admissions.   Patient presented to the ER on 7/20 complaining of shortness of breath that have been going on for about a week, but got some significantly worse that day.   Patient also complained of some chest tightness.  Reportedly, had not taken any of his medicines for the past 4 months nor has he followed up with his PCP. Labs in the ED showed Na 141, K 4.1, creatinine 1.93, WBC 8.5, hemoglobin 13.3, platelets 165. hsTn 58>>55. BNP elevated to 1637.  CXR showed cardiomegaly with pulmonary vascluar congestion, perihilar interstitial thickening bilaterally.  EKG showed sinus rhythm with frequent PVCs.  Cardiology was asked to consult, Echocardiogram pending.   Patient was interviewed with the use of an interpreter. When I entered the room, patient was attempting to use the bedside urinal. He reported that he felt like he "had to pee so bad but couldn't". Reports that he had never had this happen before. He was able to pee early this AM, but has not been able to since mid morning. Also complains of constipation.   Reports that he had been having SOB for the past week. Worse on exertion. Also has significant orthopnea. Denies chest pain. He has occasional palpitations and nonproductive cough.   Past Medical History:  Diagnosis Date   Acute CHF (congestive heart failure) ([place]) 12/17/2017   ARF (acute renal failure) ([place]) 12/17/2017   Cardiomyopathy ([place]), new, EF 30-35% 12/18/2017   Chest pain 12/17/2017   Demand ischemia ([place])    Diabetes ([place])    Elevated troponin 12/18/2017   Hydroureteronephrosis, left, mild to moderate 12/18/2017   Hypertension    Hyperthyroidism 12/18/2017   Penile pain 12/18/2017    Past Surgical History:  Procedure Laterality Date   NOSE SURGERY  Home Medications:  Prior to Admission medications   Medication Sig Start Date End Date Taking? Authorizing Provider  atorvastatin (LIPITOR) 10 MG tablet Take 10 mg by mouth daily.    [provider]  carvedilol (COREG) 12.5 MG tablet Take 1 tablet (12.5 mg total) by mouth 2 (two) times daily with a meal. Please make yearly appt with Dr. Meda Coffee for January before anymore refills.  1st attempt 02/09/19   Jerline Pain, MD  furosemide (LASIX) 20 MG tablet Take 20 mg by mouth daily.    [provider]  hydrALAZINE (APRESOLINE) 100 MG tablet TAKE 1 TABLET BY MOUTH THREE TIMES DAILY 04/12/19   Burtis Junes, NP  isosorbide mononitrate (IMDUR) 60 MG 24 hr tablet Take 1 tablet (60 mg total) by mouth daily. Please make yearly appt with Dr. Meda Coffee for January before anymore refills. 1st attempt 02/09/19   Jerline Pain, MD  valsartan (DIOVAN) 160 MG tablet Take 1 tablet (160 mg total) by mouth daily. 06/08/19   Imogene Burn, PA-C    Inpatient Medications: Scheduled Meds:  atorvastatin  10 mg Oral Daily   docusate sodium  100 mg Oral BID   enoxaparin (LOVENOX) injection  40 mg Subcutaneous Daily   furosemide  40 mg Intravenous BID   hydrALAZINE  100 mg Oral TID   sodium chloride flush  3 mL Intravenous Q12H   Continuous Infusions:  nitroGLYCERIN 40 mcg/min (10/04/21 1213)   PRN Meds: acetaminophen **OR** acetaminophen, bisacodyl, hydrALAZINE, morphine injection, ondansetron **OR** ondansetron (ZOFRAN) IV, oxyCODONE, polyethylene glycol, traZODone  Allergies:    Allergies  Allergen Reactions   Heparin Shortness Of Breath    Patient family stated when heparin is given patient starts shaking, wheezing. [facility] notified.    Social History:   Social History   Socioeconomic History   Marital status: Married    Spouse name: Not on file   Number of children: Not on file   Years of education: Not on file   Highest education level: Not on file  Occupational History   Occupation: Retired  Tobacco Use   Smoking status: Never   Smokeless tobacco: Never  Substance and Sexual Activity   Alcohol use: Never   Drug use: Never   Sexual activity: Not on file  Other Topics Concern   Not on file  Social History Narrative   Pt and wife are [place], [place]. Wife speaks a little [place] and [place], a little [place], mostly [place].   Husband speaks more  [place] and [place].   Social Determinants of Health   Financial Resource Strain: Not on file  Food Insecurity: Not on file  Transportation Needs: Not on file  Physical Activity: Not on file  Stress: Not on file  Social Connections: Not on file  Intimate Partner Violence: Not on file    Family History:    Family History  Problem Relation Age of Onset   Unexplained death Mother 70   Unexplained death Father 7   Hypertension Neg Hx    Heart disease Neg Hx      ROS:  Please see the history of present illness.   All other ROS reviewed and negative.     Physical Exam/Data:   Vitals:   10/04/21 1030 10/04/21 1115 10/04/21 1130 10/04/21 1245  BP: (!) 196/140 (!) 150/95 (!) 120/92 (!) 152/84  Pulse: 90 94 (!) 102 (!) 110  Resp: (!) 25 (!) 22 (!) 22 (!) 25  Temp:  TempSrc:      SpO2: 98% 97% 98% 99%    Intake/Output Summary (Last 24 hours) at 10/04/2021 1310 Last data filed at 10/04/2021 1029 Gross per 24 hour  Intake --  Output 750 ml  Net -750 ml      07/14/2019    8:27 AM 06/08/2019    9:54 AM 03/25/2018    9:27 AM  Last 3 Weights  Weight (lbs) 186 lb 188 lb 3.2 oz 184 lb 1.9 oz  Weight (kg) 84.369 kg 85.367 kg 83.516 kg     There is no height or weight on file to calculate BMI.  General:  Patient visibly uncomfortable, trying to the use the restroom  HEENT: normal Neck: no JVD Vascular: Radial pulses 2+ bilaterally Cardiac:  normal S1, S2; RRR; no murmur  Lungs:  crackles in bilateral lung bases, otherwise clear to auscultation bilaterally  Abd: distended, tender to palpation over suprapubic region  Ext: trace edema Musculoskeletal:  No deformities, BUE and BLE strength normal and equal Skin: warm and dry  Neuro:  CNs 2-12 intact, no focal abnormalities noted Psych:  Normal affect   EKG:  The EKG was personally reviewed and demonstrates:   Telemetry:  Telemetry was personally reviewed and demonstrates:  Sinus tachycardia with frequent PVCs,  ventricular bigeminy  Relevant CV Studies:   Laboratory Data:  High Sensitivity Troponin:   Recent Labs  Lab 10/04/21 0215 10/04/21 0808  TROPONINIHS 58* 55*     Chemistry Recent Labs  Lab 10/04/21 0215  NA 141  K 4.1  CL 107  CO2 25  GLUCOSE 102*  BUN 21  CREATININE 1.93*  CALCIUM 8.8*  GFRNONAA 37*  ANIONGAP 9    No results for input(s): "PROT", "ALBUMIN", "AST", "ALT", "ALKPHOS", "BILITOT" in the last 168 hours. Lipids No results for input(s): "CHOL", "TRIG", "HDL", "LABVLDL", "LDLCALC", "CHOLHDL" in the last 168 hours.  Hematology Recent Labs  Lab 10/04/21 0215  WBC 8.5  RBC 4.81  HGB 13.3  HCT 41.6  MCV 86.5  MCH 27.7  MCHC 32.0  RDW 14.6  PLT 165   Thyroid No results for input(s): "TSH", "FREET4" in the last 168 hours.  BNP Recent Labs  Lab 10/04/21 0215  BNP 1,637.7*    DDimer No results for input(s): "DDIMER" in the last 168 hours.   Radiology/Studies:  DG Chest Portable 1 View  Result Date: 10/04/2021 CLINICAL DATA:  Shortness of breath, chest tightness. EXAM: PORTABLE CHEST 1 VIEW COMPARISON:  04/26/2019. FINDINGS: The heart is enlarged the mediastinal contour is stable. The pulmonary vasculature is mildly distended. Perihilar interstitial prominence is noted bilaterally. No consolidation, effusion, or pneumothorax. No acute osseous abnormality. IMPRESSION: 1. Cardiomegaly with pulmonary vascular congestion. 2. Perihilar interstitial thickening bilaterally, possible edema or infiltrate. Electronically Signed   By: Brett Fairy M.D.   On: 10/04/2021 02:31     Assessment and Plan:   Acute on chronic combined CHF  - Historically, patient had an EF of 30% in 2019 - Patient has only been admitted one other time for CHF in 12/2017-- at that time, patient was started on lasix but quickly developed AKI. Due to renal function, patient did not have a cath to evaluate low EF - Most recent echocardiogram from 03/2018 showed EF improved to 50-55%, grade  II diastolic dysfunction with severe LVH  - Now, patient presented complaining of orthopnea, sob on exertion for the past week. Symptoms noticeably worsened yesterday evening  - BNP elevated to 1637 - CXR with  cardiomegaly and pulmonary vascular congestion, perihilar interstitial thickening bilaterally  - BP was significantly elevated to 201/147 on presentation-- possible he had flash pulmonary edema in the setting of HTN emergency?  - Patient was given one does of lasix 80 mg -- output 1.75 L urine so far - Now, patient complaining of inability to urinate stating "I need to pee so bad but I cannot". Reports feeling pain when peeing, feels like "pressure". Ordered UA and bladder scan  - Creatinine elevated to 1.93-- limits GDMT for now  - Echocardiogram pending  - Resume home hydralazine 100 mg TID - Start carvedilol 12.5 mg BID -- patient is tachycardic, not overly volume overloaded on exam  - Hold home valsartan for now given elevated creatinine - Start lasix 60 mg IV BID- pending UA and bladder scan   Elevated troponin  - hsTn 58>>55  - Denies chest pain - Low and flat trop- suspect secondary to demand ischemia in the setting of CHF, hypertensive urgency with AKI   Suspected AKI  - Patient presented with creatinine 1.93- no records since 2021  - Avoid nephrotoxic medications  - UA and bladder scan ordered as above due to patient's complaints of inability to urinate, painful urination   PVCs  Ventricular Bigeminy and trigeminy  - Seen on EKG and telemetry  - Start carvedilol as above  - K 4.1, ordered mag   HTN Urgency  - Start carvedilol, lasix, hydralazine as above  - Otherwise per primary   Risk Assessment/Risk Scores:    New York Heart Association (NYHA) Functional Class NYHA Class III        For questions or updates, please contact [facility] Please consult [email or web address] for contact info under    Signed, Margie Billet, PA-C  10/04/2021 1:10  PM  Patient seen and examined with KJ PA.  Agree as above, with the following exceptions and changes as noted below. Patient seen writhing in pain on my exam, having bilateral severe LE leg cramps.  The interpreter was called and I was able to connect briefly with the patient before he began screaming in pain.  Unable to obtain history due to patient's discomfort. Gen: severe pain in legs.  Extremities: No significant lower extremity edema neuro: Moves all extremities, nonfocal exam.  All available labs, radiology testing, previous records reviewed. Pt admitted with concerns for CHF, lasix effective but he has urinary retention requiring in and out cath.  Defer to internal medicine on management of urinary retention.  Reassess volume status and urine output tomorrow to redose Lasix if needed.  Nitroglycerin IV has been added for the management of chest pain, however I have discussed with the nurse to begin to wean down on nitro and assess for chest pain and the setting of 2 L of urine output and adequate blood pressure control.  Troponin is only minimally elevated, may need an outpatient ischemic evaluation if chest pain does not resolve with volume management.  I recommended 2 g of IV magnesium and repeat electrolytes for the management of his severe bilateral lower extremity cramping which may have been secondary to volume shift with diuresis.  Elouise Munroe, MD 10/04/21 8:02 PM

## 2021-10-04 NOTE — ED Provider Notes (Signed)
[facility] EMERGENCY DEPARTMENT Provider Note   CSN: [id number] Arrival date & time: 10/04/21  0153     History  Chief Complaint  Patient presents with   Shortness of Breath    Daniel Obrien is a 71 y.o. male.  HPI Level 5 caveat secondary to language barrier we are unable to have a translator with his language through our translator system.  Son is at bedside and provides translation 71 year old male, speaks Janai, dents today with increasing dyspnea and chest pain.  Patient has not been taking his medications for the past 4 months.  It appears that he had not followed up with primary Care physicians.  Patient has had increasing dyspnea with orthopnea.  Had some intermittent chest pain.  He denies current chest pain.  He denies fever, chills nausea, vomiting, diarrhea     Home Medications Prior to Admission medications   Medication Sig Start Date End Date Taking? Authorizing Provider  atorvastatin (LIPITOR) 10 MG tablet Take 10 mg by mouth daily.    [provider]  carvedilol (COREG) 12.5 MG tablet Take 1 tablet (12.5 mg total) by mouth 2 (two) times daily with a meal. Please make yearly appt with Dr. Meda Coffee for January before anymore refills. 1st attempt 02/09/19   Jerline Pain, MD  furosemide (LASIX) 20 MG tablet Take 20 mg by mouth daily.    [provider]  hydrALAZINE (APRESOLINE) 100 MG tablet TAKE 1 TABLET BY MOUTH THREE TIMES DAILY 04/12/19   Burtis Junes, NP  isosorbide mononitrate (IMDUR) 60 MG 24 hr tablet Take 1 tablet (60 mg total) by mouth daily. Please make yearly appt with Dr. Meda Coffee for January before anymore refills. 1st attempt 02/09/19   Jerline Pain, MD  valsartan (DIOVAN) 160 MG tablet Take 1 tablet (160 mg total) by mouth daily. 06/08/19   Imogene Burn, PA-C      Allergies    Heparin    Review of Systems   Review of Systems  Physical Exam Updated Vital Signs BP (!) 196/140   Pulse 90   Temp 98.1 F (36.7  C) (Oral)   Resp (!) 25   SpO2 98%  Physical Exam Vitals and nursing note reviewed.  Constitutional:      General: He is in acute distress.     Appearance: He is well-developed. He is ill-appearing.  HENT:     Head: Normocephalic.     Mouth/Throat:     Mouth: Mucous membranes are moist.  Cardiovascular:     Rate and Rhythm: Normal rate and regular rhythm.  Pulmonary:     Effort: Tachypnea present.     Breath sounds: Decreased breath sounds and rhonchi present.  Abdominal:     General: Bowel sounds are normal.     Palpations: Abdomen is soft.  Musculoskeletal:     Cervical back: Normal range of motion.     Right lower leg: No tenderness. Edema present.     Left lower leg: No tenderness. Edema present.  Skin:    General: Skin is warm.     Capillary Refill: Capillary refill takes less than 2 seconds.  Neurological:     General: No focal deficit present.     Mental Status: He is alert.  Psychiatric:        Mood and Affect: Mood normal.     ED Results / Procedures / Treatments   Labs (all labs ordered are listed, but only abnormal results are displayed) Labs Reviewed  BASIC METABOLIC PANEL - Abnormal; Notable for the following components:      Result Value   Glucose, Bld 102 (*)    Creatinine, Ser 1.93 (*)    Calcium 8.8 (*)    GFR, Estimated 37 (*)    All other components within normal limits  BRAIN NATRIURETIC PEPTIDE - Abnormal; Notable for the following components:   B Natriuretic Peptide 1,637.7 (*)    All other components within normal limits  TROPONIN I (HIGH SENSITIVITY) - Abnormal; Notable for the following components:   Troponin I (High Sensitivity) 58 (*)    All other components within normal limits  TROPONIN I (HIGH SENSITIVITY) - Abnormal; Notable for the following components:   Troponin I (High Sensitivity) 55 (*)    All other components within normal limits  SARS CORONAVIRUS 2 BY RT PCR  CBC WITH DIFFERENTIAL/PLATELET    EKG EKG  Interpretation  Date/Time:  Thursday October 04 2021 02:01:31 EDT Ventricular Rate:  72 PR Interval:  194 QRS Duration: 110 QT Interval:  428 QTC Calculation: 468 R Axis:   115 Text Interpretation: Sinus rhythm with frequent Premature ventricular complexes lvh with nonspecific intraventricular delay Abnormal ECG When compared with ECG of 21-Dec-2017 21:33, PVCs are new Confirmed by Pattricia Boss [phone]) on 10/04/2021 9:14:40 AM  Radiology DG Chest Portable 1 View  Result Date: 10/04/2021 CLINICAL DATA:  Shortness of breath, chest tightness. EXAM: PORTABLE CHEST 1 VIEW COMPARISON:  04/26/2019. FINDINGS: The heart is enlarged the mediastinal contour is stable. The pulmonary vasculature is mildly distended. Perihilar interstitial prominence is noted bilaterally. No consolidation, effusion, or pneumothorax. No acute osseous abnormality. IMPRESSION: 1. Cardiomegaly with pulmonary vascular congestion. 2. Perihilar interstitial thickening bilaterally, possible edema or infiltrate. Electronically Signed   By: Brett Fairy M.D.   On: 10/04/2021 02:31    Procedures Procedures    Medications Ordered in ED Medications  nitroGLYCERIN 50 mg in dextrose 5 % 250 mL (0.2 mg/mL) infusion (10 mcg/min Intravenous Rate/Dose Change 10/04/21 1030)  furosemide (LASIX) injection 80 mg (80 mg Intravenous Given 10/04/21 0941)  hydrALAZINE (APRESOLINE) injection 20 mg (20 mg Intravenous Given 10/04/21 1101)    ED Course/ Medical Decision Making/ A&P Clinical Course as of 10/04/21 1101  Thu Oct 04, 2021  0912 Chest x-Nas Wafer reviewed interpreted significant for cardiomegaly and vascular congestion consistent with pulmonary edema although cannot rule out infection and radiologist interpretation concurs [DR]  0914 CBC reviewed interpreted and within normal limits [DR]  2706 Basic metabolic panel reviewed interpreted and elevated creatinine although only mildly elevated from baseline [DR]  0915 BMP reviewed interpreted and  elevated at 1637 [DR]  0915 Troponin reviewed interpreted and elevated at 55 although was 58 several hours ago so does not appear to be trending upward [DR]    Clinical Course User Index [DR] Pattricia Boss, MD                           Medical Decision Making 71 year old male history of hypertension, CHF, presents today with noncompliance with medication and increasing dyspnea Differential diagnosis includes but is not limited to, hypertensive urgency, CHF, coronary artery disease, acute pulmonary infection, pulmonary emboli Patient is hypertensive here in the ED with systolic blood pressure of 190 on my initial evaluation.  With known hypertension and noncompliance with medication.  Patient started on nitro drip here Patient clinically appears to have volume overload this is supported with radiologic imaging of his chest and BNP He  is treated here with 80 mg of Lasix Doubt acute infection as patient has not had fever or chills or change in sputum Patient with some ongoing chest pain and elevated troponins although not trending up.  Does not appear to have acute MI.  Suspect that this is cardiac strain from his volume overload CKD with elevated creatinine 1 respiratory distress secondary to volume overload 2 known CHF with volume overload 3 noncompliance with medications 4 hypertensive urgency secondary to #3 5 elevated creatinine with prior history of AKI which was reported as resolved 6 elevated troponin without evidence of acute MI, likely secondary to heart strain with #2 and 4  Amount and/or Complexity of Data Reviewed External Data Reviewed: notes.    Details: Cardiology clinic note 2021 Jakorey Mcconathy is a 71 y.o. male  Was diagnosed dilated cardiomyopathy/combined systolic and diastolic heart failure in the setting of hypertensive urgency in 2019 echo LVEF 30 to 35%.  Also had acute kidney injury at that time which resolved.  Follow-up echo 03/23/2018 LVEF improved to 50 to 55% with no wall  motion abnormality and grade 2 DD with mild MR AI. Discussion of management or test interpretation with external provider(s): Care discussed with Dr. Lorin Mercy who will see for admission COVID test added  Risk Prescription drug management. Decision regarding hospitalization. Diagnosis or treatment significantly limited by social determinants of health. Risk Details: Patient managed here with IV medications for high blood pressure and congestive heart failure including IV nitroglycerin, IV Lasix, and IV hydralazine Patient was maintained on the monitor and close monitoring was in place Significant language barrier is in place and only current translator is son  Critical Care Total time providing critical care: 45 minutes           Final Clinical Impression(s) / ED Diagnoses Final diagnoses:  Acute on chronic congestive heart failure, unspecified heart failure type [facility])  Hypertensive urgency    Rx / DC Orders ED Discharge Orders     None         Pattricia Boss, MD 10/04/21 1101

## 2021-10-04 NOTE — Progress Notes (Signed)
2d echo attempted, patient in route to floor

## 2021-10-05 ENCOUNTER — Other Ambulatory Visit (HOSPITAL_COMMUNITY): Payer: Self-pay

## 2021-10-05 ENCOUNTER — Other Ambulatory Visit: Payer: Self-pay

## 2021-10-05 ENCOUNTER — Inpatient Hospital Stay (HOSPITAL_COMMUNITY): Payer: Medicare Other

## 2021-10-05 ENCOUNTER — Encounter (HOSPITAL_COMMUNITY): Payer: Self-pay | Admitting: Internal Medicine

## 2021-10-05 ENCOUNTER — Telehealth (HOSPITAL_COMMUNITY): Payer: Self-pay | Admitting: Pharmacy Technician

## 2021-10-05 DIAGNOSIS — I5023 Acute on chronic systolic (congestive) heart failure: Secondary | ICD-10-CM | POA: Diagnosis not present

## 2021-10-05 DIAGNOSIS — I5021 Acute systolic (congestive) heart failure: Secondary | ICD-10-CM | POA: Diagnosis not present

## 2021-10-05 DIAGNOSIS — I10 Essential (primary) hypertension: Secondary | ICD-10-CM

## 2021-10-05 LAB — BASIC METABOLIC PANEL
Anion gap: 11 (ref 5–15)
BUN: 26 mg/dL — ABNORMAL HIGH (ref 8–23)
CO2: 28 mmol/L (ref 22–32)
Calcium: 8.9 mg/dL (ref 8.9–10.3)
Chloride: 99 mmol/L (ref 98–111)
Creatinine, Ser: 1.97 mg/dL — ABNORMAL HIGH (ref 0.61–1.24)
GFR, Estimated: 36 mL/min — ABNORMAL LOW (ref >60)
Glucose, Bld: 158 mg/dL — ABNORMAL HIGH (ref 70–99)
Potassium: 3.3 mmol/L — ABNORMAL LOW (ref 3.5–5.1)
Sodium: 138 mmol/L (ref 135–145)

## 2021-10-05 LAB — ECHOCARDIOGRAM COMPLETE
Area-P 1/2: 3.37 cm2
Calc EF: 44.3 %
Height: 67 in
S' Lateral: 4.5 cm
Single Plane A2C EF: 42.9 %
Single Plane A4C EF: 45.8 %
Weight: 2770.39 oz

## 2021-10-05 LAB — CBC
HCT: 43.2 % (ref 39.0–52.0)
Hemoglobin: 13.9 g/dL (ref 13.0–17.0)
MCH: 26.9 pg (ref 26.0–34.0)
MCHC: 32.2 g/dL (ref 30.0–36.0)
MCV: 83.6 fL (ref 80.0–100.0)
Platelets: 171 10*3/uL (ref 150–400)
RBC: 5.17 MIL/uL (ref 4.22–5.81)
RDW: 14.6 % (ref 11.5–15.5)
WBC: 12.7 10*3/uL — ABNORMAL HIGH (ref 4.0–10.5)
nRBC: 0 % (ref 0.0–0.2)

## 2021-10-05 LAB — HIV ANTIBODY (ROUTINE TESTING W REFLEX): HIV Screen 4th Generation wRfx: NONREACTIVE

## 2021-10-05 LAB — TSH: TSH: 0.911 u[IU]/mL (ref 0.350–4.500)

## 2021-10-05 MED ORDER — POTASSIUM CHLORIDE CRYS ER 20 MEQ PO TBCR
40.0000 meq | EXTENDED_RELEASE_TABLET | Freq: Two times a day (BID) | ORAL | Status: DC
  Administered 2021-10-05 – 2021-10-06 (×3): 40 meq via ORAL
  Filled 2021-10-05 (×3): qty 2

## 2021-10-05 MED ORDER — CHLORHEXIDINE GLUCONATE CLOTH 2 % EX PADS
6.0000 | MEDICATED_PAD | Freq: Every day | CUTANEOUS | Status: DC
  Administered 2021-10-05 – 2021-10-07 (×3): 6 via TOPICAL

## 2021-10-05 MED ORDER — TAMSULOSIN HCL 0.4 MG PO CAPS
0.4000 mg | ORAL_CAPSULE | Freq: Every day | ORAL | Status: DC
Start: 2021-10-05 — End: 2021-10-09
  Administered 2021-10-05 – 2021-10-09 (×5): 0.4 mg via ORAL
  Filled 2021-10-05 (×5): qty 1

## 2021-10-05 MED ORDER — DAPAGLIFLOZIN PROPANEDIOL 10 MG PO TABS
10.0000 mg | ORAL_TABLET | Freq: Every day | ORAL | Status: DC
  Administered 2021-10-05: 10 mg via ORAL
  Filled 2021-10-05: qty 1

## 2021-10-05 NOTE — TOC Initial Note (Addendum)
Transition of Care [facility]) - Initial/Assessment Note    Patient Details  Name: Daniel Obrien MRN: [id number] Date of Birth: 01-03-1951  [facility] of [facility]) CM/SW Contact:    Erenest Rasher, RN Phone Number: [phone] 10/05/2021, 11:42 AM  Clinical Narrative:                 HF TOC CM spoke to pt and son, Loih at bedside. Son was able to translate for pt. Pt has missed his appt at PCP and has not been taking medications at home. Son states he will ensure pt follow up with PCP. Will request meds come up from [place] at dc. Pt may benefit from [facility] RN at dc. Will requested [facility] RN orders with [facility]. [facility] consult placed.   Arranged appt with [facility] Physician, appt arranged 10/11/2021 at 1115 am.   Provided information to son. Offered choice for HH (medicare.gov list with ratings given and placed on chart). Son agreeable to [employer], contacted rep, Caryl Pina with new referral.   Expected Discharge Plan: Home/Self Care Barriers to Discharge: Continued Medical Work up   Patient Goals and CMS Choice        Expected Discharge Plan and Services Expected Discharge Plan: Home/Self Care   Discharge Planning Services: CM Consult, Follow-up appt scheduled   Living arrangements for the past 2 months: Single Family Home                                      Prior Living Arrangements/Services Living arrangements for the past 2 months: Single Family Home Lives with:: Spouse, Adult Children   Do you feel safe going back to the place where you live?: Yes      Need for Family Participation in Patient Care: Yes (Comment) Care giver support system in place?: Yes (comment)   Criminal Activity/Legal Involvement Pertinent to Current Situation/Hospitalization: No - Comment as needed  Activities of Daily Living Home Assistive Devices/Equipment: Eyeglasses ADL Screening (condition at time of admission) Patient's cognitive ability adequate to safely complete daily activities?: Yes Is  the patient deaf or have difficulty hearing?: No Does the patient have difficulty seeing, even when wearing glasses/contacts?: No Does the patient have difficulty concentrating, remembering, or making decisions?: Yes Patient able to express need for assistance with ADLs?: Yes Does the patient have difficulty dressing or bathing?: No Independently performs ADLs?: Yes (appropriate for developmental age) Does the patient have difficulty walking or climbing stairs?: Yes Weakness of Legs: None Weakness of Arms/Hands: None  Permission Sought/Granted Permission sought to share information with : Case Manager, Family Supports, PCP Permission granted to share information with : Yes, Verbal Permission Granted  Share Information with NAME: Allie Gerhold     Permission granted to share info w Relationship: son  Permission granted to share info w Contact Information: [phone]  Emotional Assessment Appearance:: Appears stated age Attitude/Demeanor/Rapport: Gracious Affect (typically observed): Accepting Orientation: : Oriented to Self, Oriented to Place, Oriented to  Time, Oriented to Situation   Psych Involvement: No (comment)  Admission diagnosis:  Hypertensive urgency [I16.0] Acute on chronic systolic (congestive) heart failure (HCC) [I50.23] Acute on chronic congestive heart failure, unspecified heart failure type [facility]) [I50.9] Patient Active Problem List   Diagnosis Date Noted   Acute on chronic systolic (congestive) heart failure ([place]) 10/04/2021   Hypertension 05/04/2018   Hydroureteronephrosis, left, mild to moderate 12/18/2017   Elevated troponin 12/18/2017  Cardiomyopathy ([place]), new, EF 30-35% 12/18/2017   Penile pain 12/18/2017   Hyperthyroidism 12/18/2017   Demand ischemia [facility])    Hypertensive emergency 12/17/2017   Acute CHF (congestive heart failure) ([place]) 12/17/2017   Chest pain 12/17/2017   ARF (acute renal failure) ([place]) 12/17/2017   PCP:  Pa, [place]:   [place] ([place] - [address] [place] [address] Phone: [phone] Fax: [phone]     Social Determinants of Health ([place]) Interventions    Readmission Risk Interventions     No data to display

## 2021-10-05 NOTE — Progress Notes (Signed)
  Echocardiogram 2D Echocardiogram has been performed.  Daniel Obrien 10/05/2021, 2:41 PM

## 2021-10-05 NOTE — Discharge Instructions (Addendum)
Heart-Healthy Nutrition Therapy  A heart-healthy diet is recommended to reduce your unhealthy blood cholesterol levels, manage high blood pressure, and lower your risk for heart disease. To follow a heart-healthy diet, Eat a balanced diet with whole grains, fruits and vegetables, and lean protein sources. Achieve and maintain a healthy weight. Choose heart-healthy unsaturated fats. Limit saturated fats, trans fats, and cholesterol intake. Eat more plant-based or vegetarian meals using beans and soy foods for protein. Eat whole, unprocessed foods to limit the amount of sodium (salt) you eat. Limit refined carbohydrates especially sugar, sweets and sugar-sweetened beverages. If you drink alcohol, do so in moderation: one serving per day (women) and two servings per day (men). One serving is equivalent to 12 ounces beer, 5 ounces wine, or 1.5 ounces distilled spirits  Tips Tips for Choosing Heart-Healthy Fats Choose lean protein and low-fat dairy foods to reduce saturated fat intake. Saturated fat is usually found in animal-based protein and is associated with certain health risks. Saturated fat is the biggest contributor to raised low-density lipoprotein (LDL) cholesterol levels in the diet. Research shows that limiting saturated fat lowers unhealthy cholesterol levels. Eat no more than 5-6% of your total calories each day from saturated fat. Ask your registered dietitian nutritionist (RDN) to help you determine how much saturated fat is right for you. There are many foods that do not contain large amounts of saturated fats. Swapping these foods to replace foods high in saturated fats will help you limit the saturated fat you eat and improve your cholesterol levels. You can also try eating more plant-based or vegetarian meals. Instead of. Try:  Whole milk, cheese, yogurt, and ice cream 1%, %, or skim milk, low-fat cheese, non-fat yogurt, and low-fat ice cream  Fatty, marbled beef and pork Lean  beef, pork, or venison  Poultry with skin Poultry without skin  Butter, stick margarine Reduced-fat, whipped, or liquid spreads  Coconut oil, palm oil Liquid vegetable oils: corn, canola, olive, soybean and safflower oils   Avoid trans fats. Trans fats increase levels of LDL-cholesterol. Hydrogenated fat in processed foods is the main source of trans fats in foods.  Trans fats can be found in stick margarine, shortening, processed sweets, baked goods, some fried foods, and packaged foods made with hydrogenated oils. Avoid foods with "partially hydrogenated oil" on the ingredient list such as: cookies, pastries, baked goods, biscuits, crackers, microwave popcorn, and frozen dinners. Choose foods with heart healthy fats. Polyunsaturated and monounsaturated fat are unsaturated fats that may help lower your blood cholesterol level when used in place of saturated fat in your diet. Ask your RDN about taking a dietary supplement with plant sterols and stanols to help lower your cholesterol level. Research shows that substituting saturated fats with unsaturated fats is beneficial to cholesterol levels. Try these easy swaps:   Instead of. Try:  Butter, stick margarine, or solid shortening Reduced-fat, whipped, or liquid spreads  Beef, pork, or poultry with skin   Fish and seafood  Chips, crackers, snack foods Raw or unsalted nuts and seeds or nut butters Hummus with vegetables Avocado on toast  Coconut oil, palm oil Liquid vegetable oils: corn, canola, olive, soybean and safflower oils    Limit the amount of cholesterol you eat to less than 200 milligrams per day. Cholesterol is a substance carried through the bloodstream via lipoproteins, which are known as "transporters" of fat. Some body functions need cholesterol to work properly, but too much cholesterol in the bloodstream can damage arteries and build up blood   vessel linings (which can lead to heart attack and stroke). You should eat less than  200 milligrams cholesterol per day. People respond differently to eating cholesterol. There is no test available right now that can figure out which people will respond more to dietary cholesterol and which will respond less. For individuals with high intake of dietary cholesterol, different types of increase (none, small, moderate, large) in LDL-cholesterol levels are all possible.   Food sources of cholesterol include egg yolks and organ meats such as liver, gizzards.  Limit egg yolks to two to four per week and avoid organ meats like liver and gizzards to control cholesterol intake.  Tips for Choosing Heart-Healthy Carbohydrates Consume foods rich in viscous (soluble) fiber Viscous, or soluble, fiber is found in the walls of plant cells. Viscous fiber is found only in plant-based foods--animal-based foods like meat or dairy products do not contain fiber. In the stomach, viscous fibers absorb water and swell to form a thick, jelly-like mass. This helps to lower your unhealthy cholesterol Rich sources of viscous fiber include asparagus, Brussels sprouts, sweet potatoes, turnips, apricots, mangoes, oranges, legumes, barley, oats, and oat bran. Eat at least 5 to 10 grams of viscous fiber each day. As you increase your fiber intake gradually, also increase the amount of water you drink. This will help prevent constipation. If you have difficulty achieving this goal, ask your RDN about fiber laxatives. Choose fiber supplements made with viscous fibers such as psyllium seed husks or methylcellulose to help lower unhealthy cholesterol. Limit refined carbohydrates There are three types of carbohydrates: starches, sugar, and fiber. Some carbohydrates occur naturally in food, like the starches in rice or corn or the sugars in fruits and milk. Refined carbohydrates--foods with high amounts of simple sugars--can raise triglyceride levels. High triglyceride levels are associated with coronary heart disease. Some  examples of refined carbohydrate foods are table sugar, sweets, and beverages sweetened with added sugar. Tips for Reducing Sodium (Salt) Although sodium is important for your body to function, too much sodium can be harmful for people with high blood pressure.  As sodium and fluid buildup in your tissues and bloodstream, your blood pressure increases. High blood pressure may cause damage to other organs and increase your risk for a stroke. Keep your salt intake to 2300 milligrams or less per day. Even if you take a pill for blood pressure or a water pill (diuretic) to remove fluid, it is still important to have less salt in your diet. Ask your RDN what amount of sodium is right for you. Avoid processed foods.  Eat more fresh foods. Fresh fruits and vegetables are naturally low in sodium, as well as frozen vegetables and fruits that have no added juices or sauces. Fresh meats are lower in sodium than processed meats, such as bacon, sausage, and hotdogs.  Read the nutrition label or ask your butcher to help you find a fresh meat that is low in sodium. Eat less salt--at the table and when cooking. A single teaspoon of table salt has 2,300 mg of sodium. Leave the salt out of recipes for pasta, casseroles, and soups. Ask your RDN how to cook your favorite recipes without sodium Be a smart shopper. Look for food packages that say "salt-free" or "sodium-free." These items contain less than 5 milligrams of sodium per serving. "Very low-sodium" products contain less than 35 milligrams of sodium per serving. "Low-sodium" products contain less than 140 milligrams of sodium per serving.  Beware of "reduced salt" or "reduced   sodium" products.  These items may still be high in sodium. Check the nutrition label.  Add flavors to your food without adding sodium. Try lemon juice, lime juice, fruit juice or vinegar.   Dry or fresh herbs add flavor. Try basil, bay leaf, dill, rosemary, parsley, sage, dry mustard,  nutmeg, thyme, and paprika. Pepper, red pepper flakes, and cayenne pepper can add spice to your meals without adding sodium. Hot sauce contains sodium, but if you use just a drop or two, it will not add up to much. Buy a sodium-free seasoning blend or make your own at home.    Additional Lifestyle Tips Achieve and maintain a healthy weight. Talk with your RDN or your doctor about what is a healthy weight for you. Set goals to reach and maintain that weight.  To lose weight, reduce your calorie intake along with increasing your physical activity. A weight loss of 10 to 15 pounds could reduce LDL-cholesterol by 5 milligrams per deciliter. Participate in physical activity. Talk with your health care team to find out what types of physical activity are best for you. Set a plan to get about 30 minutes of exercise on most days.   Foods to Choose or to Limit Food Group Foods to Choose Foods to Limit  Grains Whole grain breads and cereals, including whole wheat, barley, rye, buckwheat, corn, teff, quinoa, millet, amaranth, brown or wild rice, sorghum, and oats Pasta, especially whole wheat or other whole grain types Brown rice, quinoa or wild rice Whole grain crackers, bread, rolls, pitas Home-made bread with reduced-sodium baking soda Breads or crackers topped with salt Cereals (hot or cold) with more than 300 mg sodium per serving Biscuits, cornbread, and other "quick" breads prepared with baking soda Bread crumbs or stuffing mix from a store High-fat bakery products, such as doughnuts, biscuits, croissants, danish pastries, pies, cookies Instant cooking foods to which you add hot water and stir--potatoes, noodles, rice, etc. Packaged starchy foods--seasoned noodle or rice dishes, stuffing mix, macaroni and cheese dinner Snacks made with partially hydrogenated oils, including chips, cheese puffs, snack mixes, regular crackers, butter-flavored popcorn  Protein Foods Lean cuts of beef and pork  (loin, leg, round, extra lean hamburger) Skinless poultry Fish Venison and other wild game Dried beans and peas Nuts and nut butters (unsalted) Seeds and seed butters (unsalted) Meat alternatives made with soy or textured vegetable protein Egg whites or egg substitute Cold cuts made with lean meat or soy protein Higher-fat cuts of meats (ribs, t-bone steak, regular hamburger) Bacon, sausage, or hot dogs Cold cuts, such as salami or bologna, deli meats, cured meats, corned beef Organ meats (liver, brains, gizzards, sweetbreads) Poultry with skin Fried or smoked meat, poultry, and fish Whole eggs and egg yolks (more than 2-4 per week) Salted legumes, nuts, seeds, or nut/seed butters Meat alternatives with high levels of sodium (>300 mg per serving) or saturated fat (>5 g per serving)  Dairy and Dairy Alternatives Nonfat (skim), low-fat, or 1%-fat milk Nonfat or low-fat yogurt or cottage cheese Fat-free and low-fat cheese Fortified non-dairy milk: almond, cashew, pea, and soy Whole milk, 2% fat milk, buttermilk Whole milk yogurt or ice cream Cream, half-&-half Cream cheese Sour cream Cheese  Vegetables Fresh, frozen, or canned vegetables without added fat or salt Avocados Canned or frozen vegetables with salt, fresh vegetables prepared with salt, butter, cheese, or cream sauce Fried vegetables Pickled vegetables such as olives, pickles, or sauerkraut  Fruits Fresh, frozen, canned, or dried fruit Fried fruits Fruits   served with butter or cream  Oils Unsaturated oils (corn, olive, peanut, soy, sunflower, canola) Soft or liquid margarines and vegetable oil spreads Salad dressings made from saturated fats Butter, stick margarine, shortening Partially hydrogenated oils or trans fats Tropical oils (coconut, palm, palm kernel oils)  Other Prepared or homemade foods, including soups, casseroles, and salads made from recommended ingredients and contain <600 mg sodium. Low-sodium seasonings  (ketchup, barbeque sauce) Spices, herbs, Salt-free seasoning mixes and marinades Vinegar Lemon or lime juice Prepared foods, including soups, casseroles, and salads made from recommended ingredients and contain >600 mg sodium. Frozen meals and prepared sides that are >600 mg of sodium Sugary and/or fatty desserts, candy, and other sweets Salts:  sea salt, kosher salt, onion salt, and garlic salt, seasoning mixes containing salt Flavorings: bouillon cubes, catsup or ketchup, barbeque sauce, Worcestershire sauce, soy sauce, salsa, relish, teriyaki sauce   Heart-Healthy Sample 1-Day Menu View Nutrient Info Breakfast 1 cup oatmeal 1 cup fat-free milk 1 cup blueberries 1 ounce almonds, unsalted 1 cup brewed coffee  Lunch 2 slices whole-wheat bread 2 ounces lean turkey breast 1 ounce low-fat Swiss cheese 2 slices tomato 2 lettuce leaves 1 pear 1 cup skim milk  Afternoon Snack 1 ounce trail mix with unsalted nuts, seeds, and raisins  Evening Meal 3 ounces broiled salmon 2/3 cup brown rice 1 teaspoon margarine, soft tub  cup broccoli, cooked  cup carrots, cooked 1 cup tossed salad 1 teaspoon olive oil and vinegar dressing 1 small whole-wheat roll 1 teaspoon margarine, soft tub 1 cup tea  Evening Snack 1 small banana  Daily Sum Nutrient Unit Value  Macronutrients  Energy kcal 2069  Energy kJ 8655  Protein g 146  Total lipid (fat) g 69  Carbohydrate, by difference g 228  Fiber, total dietary g 33  Sugars, total g 85  Minerals  Calcium, Ca mg 1292  Iron, Fe mg 14  Sodium, Na mg 1751  Vitamins  Vitamin C, total ascorbic acid mg 85  Vitamin A, IU IU 17756  Vitamin D IU 231  Lipids  Fatty acids, total saturated g 12  Fatty acids, total monounsaturated g 27  Fatty acids, total polyunsaturated g 24  Cholesterol mg 270     Heart-Healthy Vegan 1-Day Sample Menu View Nutrient Info Breakfast 1 slice whole wheat toast 2 tablespoons peanut butter without salt Tofu scramble  made with:  cup calcium-set tofu  cup green pepper  cup spinach  cup tomatoes  cup white mushrooms 1 teaspoon canola oil 1 cup soymilk fortified with calcium, vitamin B12, and vitamin D  Lunch 1 cup reduced sodium split pea soup 1 whole wheat dinner roll 1 medium apple  Dinner Salad made with: 1 cup lentils  cup cooked broccoli  cup cooked carrots 2 tablespoons hummus 1 cup sliced strawberries 1 cup soymilk fortified with calcium, vitamin B12, and vitamin D  Evening Snack 1 cup soy yogurt  cup mixed nuts  Daily Sum Nutrient Unit Value  Macronutrients  Energy kcal 1672  Energy kJ 7000  Protein g 86  Total lipid (fat) g 65  Carbohydrate, by difference g 205  Fiber, total dietary g 47  Sugars, total g 73  Minerals  Calcium, Ca mg 1443  Iron, Fe mg 19  Sodium, Na mg 1148  Vitamins  Vitamin C, total ascorbic acid mg 253  Vitamin A, IU IU 15451  Vitamin D IU 361  Lipids  Fatty acids, total saturated g 11  Fatty acids, total monounsaturated g   29  Fatty acids, total polyunsaturated g 19  Cholesterol mg 5     Heart-Healthy Vegetarian (Lacto-Ovo) Sample 1-Day Menu View Nutrient Info Breakfast 1 cup cooked oatmeal 1 tablespoon ground flaxseed 1 cup blueberries 2 scrambled egg whites with 1 teaspoon canola oil 2 tablespoons salsa 1 cup fat-free milk 1 cup coffee Oil, canola  Lunch 2 slices whole wheat bread 2 tablespoons peanut butter without salt 1 small banana 6 ounces fat-free plain yogurt 1 cup sliced red pepper 2 tablespoons hummus 1 cup fat-free milk  Evening Meal Stir fry made with:  cup tofu 1 cup brown rice  cup cooked broccoli  cup cooked carrots  cup cooked green beans 1 teaspoon peanut oil  Evening Snack 1 slice low-fat mozzarella cheese 1 medium apple  Daily Sum Nutrient Unit Value  Macronutrients  Energy kcal 1764  Energy kJ 7375  Protein g 87  Total lipid (fat) g 53  Carbohydrate, by difference g 254  Fiber, total dietary g 35   Sugars, total g 98  Minerals  Calcium, Ca mg 1609  Iron, Fe mg 12  Sodium, Na mg 1434  Vitamins  Vitamin C, total ascorbic acid mg 210  Vitamin A, IU IU 16317  Vitamin D IU 234  Lipids  Fatty acids, total saturated g 12  Fatty acids, total monounsaturated g 21  Fatty acids, total polyunsaturated g 15  Cholesterol mg 31    Copyright 2020  [employer]. All rights reserved   Additional Discharge Instructions   Please get your medications reviewed and adjusted by your Primary MD.  Please request your Primary MD to go over all Hospital Tests and Procedure/Radiological results at the follow up, please get all Hospital records sent to your Prim MD by signing hospital release before you go home.  If you had Pneumonia of Lung problems at the Hospital: Please get a 2 view Chest X ray done in approximately 4 weeks after hospital discharge or sooner if instructed by your Primary MD.  If you have Congestive Heart Failure: Please call your Cardiologist or Primary MD anytime you have any of the following symptoms:  1) 3 pound weight gain in 24 hours or 5 pounds in 1 week  2) shortness of breath, with or without a dry hacking cough  3) swelling in the hands, feet or stomach  4) if you have to sleep on extra pillows at night in order to breathe  Follow cardiac low salt diet and 1.5 lit/day fluid restriction.  If you have diabetes Accuchecks 4 times/day, Once in AM empty stomach and then before each meal. Log in all results and show them to your primary doctor at your next visit. If any glucose reading is under 80 or above 300 call your primary MD immediately.  If you have Seizure/Convulsions/Epilepsy: Please do not drive, operate heavy machinery, participate in activities at heights or participate in high speed sports until you have seen by Primary MD or a Neurologist and advised to do so again. Per [place] DMV statutes, patients with seizures are not  allowed to drive until they have been seizure-free for six months.  Use caution when using heavy equipment or power tools. Avoid working on ladders or at heights. Take showers instead of baths. Ensure the water temperature is not too high on the home water heater. Do not go swimming alone. Do not lock yourself in a room alone (i.e. bathroom). When caring for infants or small children, sit down when   holding, feeding, or changing them to minimize risk of injury to the child in the event you have a seizure. Maintain good sleep hygiene. Avoid alcohol.   If you had Gastrointestinal Bleeding: Please ask your Primary MD to check a complete blood count within one week of discharge or at your next visit. Your endoscopic/colonoscopic biopsies that are pending at the time of discharge, will also need to followed by your Primary MD.  Get Medicines reviewed and adjusted. Please take all your medications with you for your next visit with your Primary MD  Please request your Primary MD to go over all hospital tests and procedure/radiological results at the follow up, please ask your Primary MD to get all Hospital records sent to his/her office.  If you experience worsening of your admission symptoms, develop shortness of breath, life threatening emergency, suicidal or homicidal thoughts you must seek medical attention immediately by calling 911 or calling your MD immediately  if symptoms less severe.  You must read complete instructions/literature along with all the possible adverse reactions/side effects for all the Medicines you take and that have been prescribed to you. Take any new Medicines after you have completely understood and accpet all the possible adverse reactions/side effects.   Do not drive or operate heavy machinery when taking Pain medications.   Do not take more than prescribed Pain, Sleep and Anxiety Medications  Special Instructions: If you have smoked or chewed Tobacco  in the last 2 yrs  please stop smoking, stop any regular Alcohol  and or any Recreational drug use.  Wear Seat belts while driving.  Please note You were cared for by a hospitalist during your hospital stay. If you have any questions about your discharge medications or the care you received while you were in the hospital after you are discharged, you can call the unit and asked to speak with the hospitalist on call if the hospitalist that took care of you is not available. Once you are discharged, your primary care physician will handle any further medical issues. Please note that NO REFILLS for any discharge medications will be authorized once you are discharged, as it is imperative that you return to your primary care physician (or establish a relationship with a primary care physician if you do not have one) for your aftercare needs so that they can reassess your need for medications and monitor your lab values.  You can reach the hospitalist office at phone [phone] or fax [phone]   If you do not have a primary care physician, you can call [phone] for a physician referral.  

## 2021-10-05 NOTE — Progress Notes (Signed)
PROGRESS NOTE    Daniel Obrien  AQT:[id number] DOB: 1951/01/25 DOA: 10/04/2021 PCP: [facility]  71/M with history of chronic systolic CHF, diabetes, hypertension, hypothyroidism presented to the ED with shortness of breath, he has been out of his medicines for approximately 1 year, was unable to get transportation to see his cardiologist. -In the ED BP was in the 200s, chest x-ray was suggestive of CHF, troponin 55, 58   Subjective: -Feels better, breathing is improving, not back to baseline  Assessment and Plan:  Acute on chronic systolic, diastolic CHF -Previously EF of 30% in 2019, never had ischemic eval, echo in 03/2018 improved to 50-55% with grade 2 DD, severe LVH -Repeat echo pending -Volume status improving, continue IV Lasix today -Add SGLT2i -GDMT limited by CKD -Appreciate cardiology input  Urinary retention, -History suggestive of BPH -Required Foley catheter placement in the ED yesterday evening -Start Flomax, voiding trial tomorrow  Elevated troponin -55, 58, flat trend, suspect demand ischemia  Hypertensive urgency -BP improving, continue hydralazine Coreg and Lasix  PVCs, bigeminy -Replace K, continue Coreg  DVT prophylaxis: Lovenox Code Status: Full code Family Communication: Discussed with patient, wife at bedside and son on the telephone who translated Disposition Plan: Home likely 24 to 48 hours  Consultants:    Procedures:   Antimicrobials:    Objective: Vitals:   10/04/21 1955 10/05/21 0028 10/05/21 0515 10/05/21 0925  BP: (!) 134/91 (!) 146/90 132/81 135/74  Pulse: 77 (!) 39 78 94  Resp: '18  18 16  '$ Temp: 98 F (36.7 C) 98.4 F (36.9 C) 98.1 F (36.7 C) 98.4 F (36.9 C)  TempSrc: Oral Oral Oral Oral  SpO2: 100% 94% 96% 96%  Weight:   79.9 kg 78.5 kg  Height:        Intake/Output Summary (Last 24 hours) at 10/05/2021 1107 Last data filed at 10/05/2021 0500 Gross per 24 hour  Intake 575.13 ml  Output 2650 ml   Net -2074.87 ml   Filed Weights   10/04/21 1438 10/05/21 0515 10/05/21 0925  Weight: 80.2 kg 79.9 kg 78.5 kg    Examination:  General exam: Pleasant male sitting up in bed appears calm and comfortable  HEENT: Positive JVD CVS: S1-S2, regular rhythm Lungs: Poor air movement bilaterally, few expiratory wheezes Abdomen: Soft, obese, nontender, bowel sounds present Extremities: No edema Skin: No rashes Psychiatry:  Mood & affect appropriate.     Data Reviewed:   CBC: Recent Labs  Lab 10/04/21 0215 10/05/21 0330  WBC 8.5 12.7*  NEUTROABS 5.3  --   HGB 13.3 13.9  HCT 41.6 43.2  MCV 86.5 83.6  PLT 165 562   Basic Metabolic Panel: Recent Labs  Lab 10/04/21 0215 10/04/21 1702 10/05/21 0330  NA 141 138 138  K 4.1 3.4* 3.3*  CL 107 101 99  CO2 '25 25 28  '$ GLUCOSE 102* 139* 158*  BUN 21 22 26*  CREATININE 1.93* 1.80* 1.97*  CALCIUM 8.8* 9.1 8.9  MG  --  2.0  --    GFR: Estimated Creatinine Clearance: 33.1 mL/min (A) (by C-G formula based on SCr of 1.97 mg/dL (H)). Liver Function Tests: No results for input(s): "AST", "ALT", "ALKPHOS", "BILITOT", "PROT", "ALBUMIN" in the last 168 hours. No results for input(s): "LIPASE", "AMYLASE" in the last 168 hours. No results for input(s): "AMMONIA" in the last 168 hours. Coagulation Profile: No results for input(s): "INR", "PROTIME" in the last 168 hours. Cardiac Enzymes: No results for input(s): "CKTOTAL", "CKMB", "CKMBINDEX", "  TROPONINI" in the last 168 hours. BNP (last 3 results) No results for input(s): "PROBNP" in the last 8760 hours. HbA1C: Recent Labs    10/04/21 1702  HGBA1C 6.4*   CBG: No results for input(s): "GLUCAP" in the last 168 hours. Lipid Profile: Recent Labs    10/04/21 1702  CHOL 206*  HDL 52  LDLCALC 141*  TRIG 67  CHOLHDL 4.0   Thyroid Function Tests: Recent Labs    10/05/21 0330  TSH 0.911   Anemia Panel: No results for input(s): "VITAMINB12", "FOLATE", "FERRITIN", "TIBC", "IRON",  "RETICCTPCT" in the last 72 hours. Urine analysis:    Component Value Date/Time   COLORURINE STRAW (A) 10/04/2021 1502   APPEARANCEUR CLEAR 10/04/2021 1502   LABSPEC 1.006 10/04/2021 1502   PHURINE 6.0 10/04/2021 1502   GLUCOSEU NEGATIVE 10/04/2021 1502   HGBUR MODERATE (A) 10/04/2021 1502   BILIRUBINUR NEGATIVE 10/04/2021 1502   KETONESUR NEGATIVE 10/04/2021 1502   PROTEINUR NEGATIVE 10/04/2021 1502   NITRITE NEGATIVE 10/04/2021 1502   LEUKOCYTESUR NEGATIVE 10/04/2021 1502   Sepsis Labs: '@LABRCNTIP'$ (procalcitonin:4,lacticidven:4)  ) Recent Results (from the past 240 hour(s))  SARS Coronavirus 2 by RT PCR (hospital order, performed in [place] hospital lab) *cepheid single result test* Anterior Nasal Swab     Status: None   Collection Time: 10/04/21 10:58 AM   Specimen: Anterior Nasal Swab  Result Value Ref Range Status   SARS Coronavirus 2 by RT PCR NEGATIVE NEGATIVE Final    Comment: (NOTE) SARS-CoV-2 target nucleic acids are NOT DETECTED.  The SARS-CoV-2 RNA is generally detectable in upper and lower respiratory specimens during the acute phase of infection. The lowest concentration of SARS-CoV-2 viral copies this assay can detect is 250 copies / mL. A negative result does not preclude SARS-CoV-2 infection and should not be used as the sole basis for treatment or other patient management decisions.  A negative result may occur with improper specimen collection / handling, submission of specimen other than nasopharyngeal swab, presence of viral mutation(s) within the areas targeted by this assay, and inadequate number of viral copies (<250 copies / mL). A negative result must be combined with clinical observations, patient history, and epidemiological information.  Fact Sheet for Patients:   https://www.patel.info/  Fact Sheet for Healthcare Providers: https://hall.com/  This test is not yet approved or  cleared by the  [place] FDA and has been authorized for detection and/or diagnosis of SARS-CoV-2 by FDA under an Emergency Use Authorization (EUA).  This EUA will remain in effect (meaning this test can be used) for the duration of the COVID-19 declaration under Section 564(b)(1) of the Act, 21 U.S.C. section 360bbb-3(b)(1), unless the authorization is terminated or revoked sooner.  Performed at [facility], [place] [address] [place], [place] [address]      Radiology Studies: DG Chest Portable 1 View  Result Date: 10/04/2021 CLINICAL DATA:  Shortness of breath, chest tightness. EXAM: PORTABLE CHEST 1 VIEW COMPARISON:  04/26/2019. FINDINGS: The heart is enlarged the mediastinal contour is stable. The pulmonary vasculature is mildly distended. Perihilar interstitial prominence is noted bilaterally. No consolidation, effusion, or pneumothorax. No acute osseous abnormality. IMPRESSION: 1. Cardiomegaly with pulmonary vascular congestion. 2. Perihilar interstitial thickening bilaterally, possible edema or infiltrate. Electronically Signed   By: Brett Fairy M.D.   On: 10/04/2021 02:31     Scheduled Meds:  aspirin EC  81 mg Oral Daily   atorvastatin  10 mg Oral Daily   carvedilol  12.5 mg Oral BID  WC   Chlorhexidine Gluconate Cloth  6 each Topical Q0600   dapagliflozin propanediol  10 mg Oral Daily   docusate sodium  100 mg Oral BID   enoxaparin (LOVENOX) injection  40 mg Subcutaneous Daily   furosemide  40 mg Intravenous BID   hydrALAZINE  100 mg Oral TID   potassium chloride  40 mEq Oral BID   sodium chloride flush  3 mL Intravenous Q12H   tamsulosin  0.4 mg Oral Daily   Continuous Infusions:   LOS: 1 day    Time spent: 12mn  PDomenic Polite MD [facility]   10/05/2021, 11:07 AM

## 2021-10-05 NOTE — Evaluation (Signed)
Occupational Therapy Evaluation Patient Details Name: Daniel Obrien MRN: [id number] DOB: [birth year] Today's Date: 10/05/2021   History of Present Illness 71 y.o. male presents to [facility] hospital on 10/04/2021 with SOB, has not been taking CHF medications per family. PMH includes CHF, ARF, DM, HTN.   Clinical Impression   Pt admitted with the above. Pt overall S- min A with ADL activity and family will provide A at home. Pt did well with OT.       Recommendations for follow up therapy are one component of a multi-disciplinary discharge planning process, led by the attending physician.  Recommendations may be updated based on patient status, additional functional criteria and insurance authorization.   Follow Up Recommendations       Assistance Recommended at Discharge Intermittent Supervision/Assistance  Patient can return home with the following A little help with walking and/or transfers;A lot of help with bathing/dressing/bathroom;Assistance with cooking/housework    Functional Status Assessment           Precautions / Restrictions Precautions Precautions: Other (comment);Fall Precaution Comments: monitor sats and RR Restrictions Weight Bearing Restrictions: No      Mobility Bed Mobility Overal bed mobility: Needs Assistance Bed Mobility: Supine to Sit     Supine to sit: Min guard          Transfers Overall transfer level: Needs assistance Equipment used: 1 person hand held assist Transfers: Sit to/from Stand, Bed to chair/wheelchair/BSC Sit to Stand: Min assist Stand pivot transfers: Min assist                Balance Overall balance assessment: Mild deficits observed, not formally tested                                         ADL either performed or assessed with clinical judgement   ADL Overall ADL's : Needs assistance/impaired Eating/Feeding: Set up;Sitting   Grooming: Wash/dry face;Wash/dry hands;Sitting;Standing;Minimal assistance    Upper Body Bathing: Minimal assistance;Sitting   Lower Body Bathing: Moderate assistance;Sit to/from stand;Cueing for safety;Cueing for sequencing   Upper Body Dressing : Minimal assistance;Sitting   Lower Body Dressing: Moderate assistance;Sit to/from stand;Cueing for safety;Cueing for sequencing   Toilet Transfer: Minimal assistance;Stand-pivot   Toileting- Clothing Manipulation and Hygiene: Minimal assistance       Functional mobility during ADLs: Minimal assistance General ADL Comments: family will be available 24/7     Vision   Additional Comments: pt states glasses are old. pt says he does not see well     Perception   Praxis    Pertinent Vitals/Pain Pain Assessment Pain Assessment: No/denies pain     Hand Dominance Right   Extremity/Trunk Assessment Upper Extremity Assessment Upper Extremity Assessment: Defer to OT evaluation   Lower Extremity Assessment Lower Extremity Assessment: Overall WFL for tasks assessed   Cervical / Trunk Assessment Cervical / Trunk Assessment: Normal   Communication Communication Communication: Prefers language other than English ([place] Mike Gip, unable to obtain video or audio interpreter, pt's son interpeting via phone)   Cognition Arousal/Alertness: Awake/alert Behavior During Therapy: WFL for tasks assessed/performed Overall Cognitive Status: Difficult to assess                                 General Comments: follows commands well and responds appropriately to questions with interpretation via son, does understand some  English     General Comments  VSS on RA, pt with mild wheezing and increased RR with mobility however sats stable            Home Living Family/patient expects to be discharged to:: Private residence Living Arrangements: Spouse/significant other;Children Available Help at Discharge: Family Type of Home: House Home Access: Stairs to enter Technical brewer of Steps: 5 Entrance  Stairs-Rails: Right;Left Home Layout: One level     Bathroom Shower/Tub: Teacher, early years/pre: Standard     Home Equipment: None   Additional Comments: history obtained from son via phone      Prior Functioning/Environment Prior Level of Function : Independent/Modified Independent             Mobility Comments: does not drive          OT Problem List:        OT Treatment/Interventions:      OT Goals(Current goals can be found in the care plan section) Acute Rehab OT Goals Patient Stated Goal: home  OT Frequency:            End of Session Nurse Communication: Mobility status  Activity Tolerance: Patient tolerated treatment well Patient left: in chair  OT Visit Diagnosis: Unsteadiness on feet (R26.81);Muscle weakness (generalized) (M62.81)                Time: 9924-2683 OT Time Calculation (min): 15 min Charges:  OT General Charges $OT Visit: 1 Visit OT Evaluation $OT Eval Low Complexity: 1 Low  [place], Daniel Obrien 10/05/2021, 1:11 PM

## 2021-10-05 NOTE — Progress Notes (Signed)
Heart Failure Nurse Navigator Progress Note  PCP: [facility] PCP-Cardiologist: [place] Admission Diagnosis: Acute on chronic congestive heart failure, Hypertensive emergency.  Admitted from: Home  Presentation:   Daniel Obrien presented with shortness of breath for a week that became worse, some chest tightness. Patient stopped taking his medications almost a year ago, because of cost and patient stated he had no ride to doctors or pharmacy. Patient lives with his son, daughter in [place]. On admission BP 196/140, HR 90, bilateral lower extremity edema, BNP 1,637, troponin 58, IV lasix, hydralazine, and nitroGLYCERIN were given.   Thru interpretor services, patient and son were educated on the sign and symptoms of heart failure, daily weights, when to call his doctor or go to the ER. Diet/ fluid restrictions,importance of taking all medications as prescribed and attending medical appointments. Patient son informed CSW that he would be able to drive him to his appointments. Patient voiced his understanding of education.  A hospital HF TOC follow up appointment is scheduled for 10/16/2021 @ 2 pm.   ECHO/ LVEF: EF  Last 50-55 % (03/2018)  Clinical Course:  Past Medical History:  Diagnosis Date   Acute CHF (congestive heart failure) ([place]) 12/17/2017   ARF (acute renal failure) ([place]) 12/17/2017   Cardiomyopathy ([place]), new, EF 30-35% 12/18/2017   Chest pain 12/17/2017   Demand ischemia ([place])    Diabetes ([place])    Elevated troponin 12/18/2017   Hydroureteronephrosis, left, mild to moderate 12/18/2017   Hypertension    Hyperthyroidism 12/18/2017   Penile pain 12/18/2017     Social History   Socioeconomic History   Marital status: Married    Spouse name: Not on file   Number of children: Not on file   Years of education: Not on file   Highest education level: Not on file  Occupational History   Occupation: Retired  Tobacco Use   Smoking status: Never   Smokeless tobacco: Never   Vaping Use   Vaping Use: Never used  Substance and Sexual Activity   Alcohol use: Never   Drug use: Never   Sexual activity: Not on file  Other Topics Concern   Not on file  Social History Narrative   Pt and wife are [place], [place]. Wife speaks a little [place] and [place], a little [place], mostly [place].   Husband speaks more [place] and [place].   Social Determinants of Health   Financial Resource Strain: Not on file  Food Insecurity: No Food Insecurity (10/05/2021)   Hunger Vital Sign    Worried About Running Out of Food in the Last Year: Never true    Ran Out of Food in the Last Year: Never true  Transportation Needs: Not on file  Physical Activity: Not on file  Stress: Not on file  Social Connections: Not on file   Education Assessment and Provision:  Detailed education and instructions provided on heart failure disease management including the following:  Signs and symptoms of Heart Failure When to call the physician Importance of daily weights Low sodium diet Fluid restriction Medication management Anticipated future follow-up appointments  Patient education given on each of the above topics.  Patient acknowledges understanding via teach back method and acceptance of all instructions.  Education Materials:  "Living Better With Heart Failure" Booklet, HF zone tool, & Daily Weight Tracker Tool.  Patient has scale at home: No, son states he will get one.  Patient has pill box at home: NA    High Risk Criteria for  Readmission and/or Poor Patient Outcomes: Heart failure hospital admissions (last 6 months): 0  No Show rate: 0 Difficult social situation: No Demonstrates medication adherence: No, stopped his medication almost a year ago.  Primary Language: [place] Jacelyn Pi) Pharmacist, hospital) son speaks english Literacy level: states he can read and write. ([place])  Barriers of Care:   Medication compliance Daily weights Diet/ fluid  restrictions  Considerations/Referrals:   Referral made to Heart Failure Pharmacist Stewardship: yes Referral made to Heart Failure CSW/NCM TOC: yes, med costs, transportation Referral made to Heart & Vascular TOC clinic: yes, 10/16/2021 @ 2 pm.   Items for Follow-up on DC/TOC: Medication costs/transportation to get meds (son)  Diet/ fluid restrictions Daily weights   Earnestine Leys, BSN, RN Heart Failure Transport planner Only

## 2021-10-05 NOTE — Addendum Note (Signed)
Addended by: Arville Care on: 10/05/2021 03:24 PM   Modules accepted: Orders

## 2021-10-05 NOTE — Evaluation (Signed)
Physical Therapy Evaluation Patient Details Name: Daniel Obrien MRN: [id number] DOB: 31-Aug-1950 Today's Date: 10/05/2021  History of Present Illness  71 y.o. male presents to [facility] hospital on 10/04/2021 with SOB, has not been taking CHF medications per family. PMH includes CHF, ARF, DM, HTN.  Clinical Impression  Pt presents to PT with mild deficits in activity tolerance and balance, but is not far from his baseline per patient report. Pt with some instability noted with stair negotiation, although no physical assistance to require initial loss of balance. Pt is able to ambulate for household distances and maintains sats well, denying SOB. Pt will benefit from frequent mobilization during this admission in an effort to restore his prior level of function. Acute PT will continue to follow.       Recommendations for follow up therapy are one component of a multi-disciplinary discharge planning process, led by the attending physician.  Recommendations may be updated based on patient status, additional functional criteria and insurance authorization.  Follow Up Recommendations No PT follow up      Assistance Recommended at Discharge PRN  Patient can return home with the following  Assistance with cooking/housework;Assist for transportation;Help with stairs or ramp for entrance    Equipment Recommendations None recommended by PT  Recommendations for Other Services       Functional Status Assessment Patient has had a recent decline in their functional status and demonstrates the ability to make significant improvements in function in a reasonable and predictable amount of time.     Precautions / Restrictions Precautions Precautions: Other (comment);Fall Precaution Comments: monitor sats and RR Restrictions Weight Bearing Restrictions: No      Mobility  Bed Mobility                    Transfers Overall transfer level: Needs assistance Equipment used: None Transfers: Sit to/from  Stand Sit to Stand: Supervision                Ambulation/Gait Ambulation/Gait assistance: Supervision Gait Distance (Feet): 300 Feet Assistive device: None Gait Pattern/deviations: Step-through pattern Gait velocity: functional     General Gait Details: pt with steady step-through gait, mild lateral drift  Stairs Stairs: Yes Stairs assistance: Min guard Stair Management: One rail Right, Step to pattern Number of Stairs: 10    Wheelchair Mobility    Modified Rankin (Stroke Patients Only)       Balance Overall balance assessment: Needs assistance Sitting-balance support: No upper extremity supported, Feet supported Sitting balance-Leahy Scale: Good     Standing balance support: No upper extremity supported, During functional activity Standing balance-Leahy Scale: Good                               Pertinent Vitals/Pain Pain Assessment Pain Assessment: No/denies pain    Home Living Family/patient expects to be discharged to:: Private residence Living Arrangements: Spouse/significant other;Children Available Help at Discharge: Family Type of Home: House Home Access: Stairs to enter Entrance Stairs-Rails: Psychiatric nurse of Steps: 5   Home Layout: One level Home Equipment: None Additional Comments: history obtained from son via phone    Prior Function Prior Level of Function : Independent/Modified Independent             Mobility Comments: does not drive       Hand Dominance   Dominant Hand: Right    Extremity/Trunk Assessment   Upper Extremity Assessment Upper Extremity Assessment: Defer  to OT evaluation    Lower Extremity Assessment Lower Extremity Assessment: Overall WFL for tasks assessed    Cervical / Trunk Assessment Cervical / Trunk Assessment: Normal  Communication   Communication: Prefers language other than English ([place] Mike Gip, unable to obtain video or audio interpreter, pt's son  interpeting via phone)  Cognition Arousal/Alertness: Awake/alert Behavior During Therapy: WFL for tasks assessed/performed Overall Cognitive Status: Difficult to assess                                 General Comments: follows commands well and responds appropriately to questions with interpretation via son, does understand some English        General Comments General comments (skin integrity, edema, etc.): VSS on RA, pt with mild wheezing and increased RR with mobility however sats stable    Exercises     Assessment/Plan    PT Assessment Patient needs continued PT services  PT Problem List Cardiopulmonary status limiting activity;Decreased activity tolerance;Decreased balance;Decreased mobility       PT Treatment Interventions Gait training;Stair training;DME instruction;Therapeutic activities;Therapeutic exercise;Balance training;Patient/family education    PT Goals (Current goals can be found in the Care Plan section)  Acute Rehab PT Goals Patient Stated Goal: to go home PT Goal Formulation: With patient/family Time For Goal Achievement: 10/19/21 Potential to Achieve Goals: Good Additional Goals Additional Goal #1: Pt will score >19/24 on the DGI to indicate a reduced risk for falls    Frequency Min 3X/week     Co-evaluation               AM-PAC PT "6 Clicks" Mobility  Outcome Measure Help needed turning from your back to your side while in a flat bed without using bedrails?: None Help needed moving from lying on your back to sitting on the side of a flat bed without using bedrails?: None Help needed moving to and from a bed to a chair (including a wheelchair)?: A Little Help needed standing up from a chair using your arms (e.g., wheelchair or bedside chair)?: A Little Help needed to walk in hospital room?: A Little Help needed climbing 3-5 steps with a railing? : A Little 6 Click Score: 20    End of Session   Activity Tolerance: Patient  tolerated treatment well Patient left: in chair;with call bell/phone within reach;with family/visitor present Nurse Communication: Mobility status PT Visit Diagnosis: Other abnormalities of gait and mobility (R26.89)    Time: 1779-3903 PT Time Calculation (min) (ACUTE ONLY): 18 min   Charges:   PT Evaluation $PT Eval Low Complexity: Prentiss, PT, DPT Acute Rehabilitation Office [phone]   Zenaida Niece 10/05/2021, 10:45 AM

## 2021-10-05 NOTE — Progress Notes (Signed)
Initial Nutrition Assessment  DOCUMENTATION CODES:   Not applicable  INTERVENTION:  Encourage adequate PO intake Ensure Enlive po BID, each supplement provides 350 kcal and 20 grams of protein. "Heart Healthy Nutrition Therapy" handout added to AVS  NUTRITION DIAGNOSIS:   Increased nutrient needs related to acute illness as evidenced by estimated needs.  GOAL:   Patient will meet greater than or equal to 90% of their needs  MONITOR:   PO intake, Labs, Weight trends  REASON FOR ASSESSMENT:   Consult Other (Comment) (nutrition goals)  ASSESSMENT:   Pt admitted with SOB related to acute on chronic CHF. PMH significant for CHF, DM, HTN and hyperthyroidism.  Pt's son at bedside. Unable to provide details regarding his nutrition or wt but denies changes to his intake. Utilized iPad interpreter services, although pt able to understand some [place]. Pt denies changes to his recent intake. Reports eating a variety of foods 1-2 meals per day. Denies difficulty chewing/swallowing.  No documented meal completions on file, however pt reports eating well during admission. Observed lunch tray 100% complete   Reports usual wt of 172 lbs. Pt did not report changes to wt. Unfortunately, there is limited documentation of wt history to review. Current admit wt is 78.5 kg.   Medications: farxiga, colace, lasix, klor-con  Labs: potassium 3.3, BUN 26, Cr 1.97, cholesterol 206, LDL 141, HgbA1c 6.4%  NUTRITION - FOCUSED PHYSICAL EXAM:  Flowsheet Row Most Recent Value  Orbital Region No depletion  Upper Arm Region Mild depletion  Thoracic and Lumbar Region No depletion  Buccal Region No depletion  Temple Region Mild depletion  Clavicle Bone Region No depletion  Clavicle and Acromion Bone Region No depletion  Scapular Bone Region No depletion  Dorsal Hand Mild depletion  Patellar Region Mild depletion  Anterior Thigh Region Mild depletion  Posterior Calf Region No depletion  Edema (RD  Assessment) None  Hair Reviewed  Eyes Reviewed  Mouth Reviewed  Skin Reviewed  Nails Reviewed       Diet Order:   Diet Order             Diet Heart Room service appropriate? Yes; Fluid consistency: Thin; Fluid restriction: 1500 mL Fluid  Diet effective now                   EDUCATION NEEDS:   Education needs have been addressed  Skin:  Skin Assessment: Reviewed RN Assessment  Last BM:  7/20 (type 4)  Height:   Ht Readings from Last 1 Encounters:  10/04/21 '5\' 7"'$  (1.702 m)    Weight:   Wt Readings from Last 1 Encounters:  10/05/21 78.5 kg   BMI:  Body mass index is 27.12 kg/m.  Estimated Nutritional Needs:   Kcal:  1900-2100  Protein:  95-110g  Fluid:  >/=1.9L  Clayborne Dana, RDN, LDN Clinical Nutrition

## 2021-10-05 NOTE — Telephone Encounter (Signed)
Pharmacy Patient Advocate Encounter  Insurance verification completed.    The patient does not have prescription coverage.  The patient is currently admitted and ran test claims for the following: Farxiga, Jardiance.  Copays and coinsurance results were relayed to Inpatient clinical team.

## 2021-10-05 NOTE — Progress Notes (Addendum)
Progress Note  Patient Name: Daniel Obrien Date of Encounter: 10/05/2021  [facility] Cardiologist: previously Dr. Ena Dawley  Subjective   Denies any CP, breathing improved, overall feeling well.   Speak [place] dialect "Mike Gip"  (when using [employer] interpretor service: select Audio only, pressor 6 to select Other, when the representative come online after mispronunciate the name of language twice, spell out [place] - no Mike Gip interpretor was available when I interviewed the patient, but nurse was able to get one earlier.   Inpatient Medications    Scheduled Meds:  aspirin EC  81 mg Oral Daily   atorvastatin  10 mg Oral Daily   carvedilol  12.5 mg Oral BID WC   Chlorhexidine Gluconate Cloth  6 each Topical Q0600   docusate sodium  100 mg Oral BID   enoxaparin (LOVENOX) injection  40 mg Subcutaneous Daily   furosemide  40 mg Intravenous BID   hydrALAZINE  100 mg Oral TID   potassium chloride  40 mEq Oral BID   sodium chloride flush  3 mL Intravenous Q12H   tamsulosin  0.4 mg Oral Daily   Continuous Infusions:  PRN Meds: acetaminophen **OR** acetaminophen, bisacodyl, hydrALAZINE, morphine injection, ondansetron **OR** ondansetron (ZOFRAN) IV, oxyCODONE, polyethylene glycol, traZODone   Vital Signs    Vitals:   10/04/21 1955 10/05/21 0028 10/05/21 0515 10/05/21 0925  BP: (!) 134/91 (!) 146/90 132/81 135/74  Pulse: 77 (!) 39 78 94  Resp: '18  18 16  '$ Temp: 98 F (36.7 C) 98.4 F (36.9 C) 98.1 F (36.7 C) 98.4 F (36.9 C)  TempSrc: Oral Oral Oral Oral  SpO2: 100% 94% 96% 96%  Weight:   79.9 kg   Height:        Intake/Output Summary (Last 24 hours) at 10/05/2021 0932 Last data filed at 10/05/2021 0500 Gross per 24 hour  Intake 575.13 ml  Output 3400 ml  Net -2824.87 ml      10/05/2021    5:15 AM 10/04/2021    2:38 PM 07/14/2019    8:27 AM  Last 3 Weights  Weight (lbs) 176 lb 2.4 oz 176 lb 12.9 oz 186 lb  Weight (kg) 79.9 kg 80.2 kg 84.369 kg       Telemetry     - Personally Reviewed  ECG    NSR with PVCs - Personally Reviewed  Physical Exam   GEN: No acute distress.   Neck: No JVD Cardiac: RRR, no murmurs, rubs, or gallops.  Respiratory: Clear to auscultation bilaterally. Mildly diminished breath sound in R base of lung GI: Soft, nontender, non-distended  MS: No edema; No deformity. Neuro:  Nonfocal  Psych: Normal affect   Labs    High Sensitivity Troponin:   Recent Labs  Lab 10/04/21 0215 10/04/21 0808  TROPONINIHS 58* 55*     Chemistry Recent Labs  Lab 10/04/21 0215 10/04/21 1702 10/05/21 0330  NA 141 138 138  K 4.1 3.4* 3.3*  CL 107 101 99  CO2 '25 25 28  '$ GLUCOSE 102* 139* 158*  BUN 21 22 26*  CREATININE 1.93* 1.80* 1.97*  CALCIUM 8.8* 9.1 8.9  MG  --  2.0  --   GFRNONAA 37* 40* 36*  ANIONGAP '9 12 11    '$ Lipids  Recent Labs  Lab 10/04/21 1702  CHOL 206*  TRIG 67  HDL 52  LDLCALC 141*  CHOLHDL 4.0    Hematology Recent Labs  Lab 10/04/21 0215 10/05/21 0330  WBC 8.5 12.7*  RBC  4.81 5.17  HGB 13.3 13.9  HCT 41.6 43.2  MCV 86.5 83.6  MCH 27.7 26.9  MCHC 32.0 32.2  RDW 14.6 14.6  PLT 165 171   Thyroid  Recent Labs  Lab 10/05/21 0330  TSH 0.911    BNP Recent Labs  Lab 10/04/21 0215  BNP 1,637.7*    DDimer No results for input(s): "DDIMER" in the last 168 hours.   Radiology    DG Chest Portable 1 View  Result Date: 10/04/2021 CLINICAL DATA:  Shortness of breath, chest tightness. EXAM: PORTABLE CHEST 1 VIEW COMPARISON:  04/26/2019. FINDINGS: The heart is enlarged the mediastinal contour is stable. The pulmonary vasculature is mildly distended. Perihilar interstitial prominence is noted bilaterally. No consolidation, effusion, or pneumothorax. No acute osseous abnormality. IMPRESSION: 1. Cardiomegaly with pulmonary vascular congestion. 2. Perihilar interstitial thickening bilaterally, possible edema or infiltrate. Electronically Signed   By: Brett Fairy M.D.   On:  10/04/2021 02:31    Cardiac Studies   Echo 03/23/2018 LV EF: 50% -   55%   -------------------------------------------------------------------  Indications:      (I42.0).   -------------------------------------------------------------------  History:   PMH:  Dilated cardiomyopathy. Acquired from the patient  and from the patient&'s chart.  Risk factors:  Hypertension.  Diabetes mellitus.   -------------------------------------------------------------------  Study Conclusions   - Left ventricle: The cavity size was normal. Wall thickness was    increased in a pattern of severe LVH. Systolic function was    normal. The estimated ejection fraction was in the range of 50%    to 55%. Wall motion was normal; there were no regional wall    motion abnormalities. Features are consistent with a pseudonormal    left ventricular filling pattern, with concomitant abnormal    relaxation and increased filling pressure (grade 2 diastolic    dysfunction).  - Aortic valve: There was mild regurgitation.  - Aorta: Aortic root dimension: 38 mm (ED). Ascending aortic    diameter: 38 mm (S).  - Aortic root: The aortic root was mildly dilated.  - Ascending aorta: The ascending aorta was mildly dilated.  - Mitral valve: There was mild regurgitation.  - Left atrium: The atrium was severely dilated.  - Tricuspid valve: There was trivial regurgitation.  - Pulmonary arteries: PA peak pressure: 31 mm Hg (S).   Patient Profile     71 y.o. male with PMH of chronic combined systolic and diastolic CHF with improved EF, HTN, and DM II presented with SOB for several weeks after missing his medication for month.  Assessment & Plan    Acute on chronic combined CHF  - EF 30% on echo in 12/2017, improved to 50-55% by 03/2018  - presented with hypertensive emergency after not taking any medication for month. Was in CHF exacerbation on arrival, possibly flash pulmonary edema in the setting of uncontrolled BP  -  appears to be euvolemic today. Cr up slightly after diuresis. Patient appears to be euvolemic on exam. Consider D/C IV lasix, unless EF is severely low consider change lasix to '40mg'$  PRN only.   - as long as EF is stable, patient likely can be discharged either later today or tomorrow.   Elevated troponin: suspect demand ischemia in the setting of CHF  CKD: arrived with Cr 1.9 --> 1.8 -->1.97. Suspect more CKD rather than AKI  PVCs: frequent PVCs, continue coreg, may consider holter monitor as outpatient at some point to assess PVC burden, not urgent.   Hypertensive urgency: Off of all  BP meds for at least several month, need better follow up. Started on coreg and hydralazine. BP normalized. Continue on the current therapy. Home valsartan held, switched to hydralazine.       For questions or updates, please contact [place] Please consult [email or web address] for contact info under        Signed, Almyra Deforest, [place]  10/05/2021, 9:32 AM    INTERPRETER PHONE NUMBER: [phone] for Dion Body Patient seen and examined with Almyra Deforest PA.  Agree as above, with the following exceptions and changes as noted below.  Patient feels well today and no longer has chest pain.  I recommend medical management given renal dysfunction, but recommend outpatient nuclear stress test given renal dysfunction we may not be able to get a CT in the near future. Gen: NAD, CV: RRR, no murmurs, Lungs: clear, Abd: soft, Extrem: Warm, well perfused, no edema, Neuro/Psych: alert and oriented x 3, normal mood and affect. All available labs, radiology testing, previous records reviewed.  There are issues with obtaining his medications due to lack of PCP follow-up, neither he nor his wife drive and they rely on family for transportation.  I have recommended to the patient's nurse that we involve social work/case management and he may be able to utilize [facility] transportation services.  Recommend outpatient social work through our  heart care office.  We will arrange follow-up for him in 3 to 4 weeks and strongly encourage PCP follow-up within 1 week of dismissal.  Consider [facility] for PCP care.  He appears euvolemic and may do well on as needed Lasix home-going as noted above.  Patient has an echocardiogram scheduled for today, if primary service feels he is stable for discharge can consider obtaining this as an outpatient.  Otherwise follow-up echocardiogram and if no change from prior, patient does not require further cardiovascular care as inpatient.  Elouise Munroe, MD 10/05/21 12:48 PM

## 2021-10-06 DIAGNOSIS — N183 Chronic kidney disease, stage 3 unspecified: Secondary | ICD-10-CM

## 2021-10-06 DIAGNOSIS — I5023 Acute on chronic systolic (congestive) heart failure: Secondary | ICD-10-CM | POA: Diagnosis not present

## 2021-10-06 LAB — CBC
HCT: 42.6 % (ref 39.0–52.0)
Hemoglobin: 14.1 g/dL (ref 13.0–17.0)
MCH: 27.4 pg (ref 26.0–34.0)
MCHC: 33.1 g/dL (ref 30.0–36.0)
MCV: 82.9 fL (ref 80.0–100.0)
Platelets: 164 10*3/uL (ref 150–400)
RBC: 5.14 MIL/uL (ref 4.22–5.81)
RDW: 14.6 % (ref 11.5–15.5)
WBC: 12 10*3/uL — ABNORMAL HIGH (ref 4.0–10.5)
nRBC: 0 % (ref 0.0–0.2)

## 2021-10-06 LAB — BASIC METABOLIC PANEL
Anion gap: 13 (ref 5–15)
BUN: 41 mg/dL — ABNORMAL HIGH (ref 8–23)
CO2: 25 mmol/L (ref 22–32)
Calcium: 8.5 mg/dL — ABNORMAL LOW (ref 8.9–10.3)
Chloride: 98 mmol/L (ref 98–111)
Creatinine, Ser: 2.82 mg/dL — ABNORMAL HIGH (ref 0.61–1.24)
GFR, Estimated: 23 mL/min — ABNORMAL LOW (ref >60)
Glucose, Bld: 122 mg/dL — ABNORMAL HIGH (ref 70–99)
Potassium: 3.5 mmol/L (ref 3.5–5.1)
Sodium: 136 mmol/L (ref 135–145)

## 2021-10-06 MED ORDER — HYDRALAZINE HCL 10 MG PO TABS
10.0000 mg | ORAL_TABLET | Freq: Three times a day (TID) | ORAL | Status: DC
Start: 2021-10-06 — End: 2021-10-07
  Administered 2021-10-06 (×2): 10 mg via ORAL
  Filled 2021-10-06 (×2): qty 1

## 2021-10-06 MED ORDER — HYDRALAZINE HCL 25 MG PO TABS: 25.0000 mg | ORAL_TABLET | Freq: Three times a day (TID) | ORAL | Status: DC

## 2021-10-06 MED ORDER — ENOXAPARIN SODIUM 30 MG/0.3ML IJ SOSY
30.0000 mg | PREFILLED_SYRINGE | Freq: Every day | INTRAMUSCULAR | Status: DC
  Administered 2021-10-07 – 2021-10-09 (×3): 30 mg via SUBCUTANEOUS
  Filled 2021-10-06 (×3): qty 0.3

## 2021-10-06 NOTE — Progress Notes (Signed)
PROGRESS NOTE    Daniel Obrien  QZR:[id number] DOB: 08/05/1950 DOA: 10/04/2021 PCP: [facility]  71/M with history of chronic systolic CHF, diabetes, hypertension, hypothyroidism presented to the ED with shortness of breath, he has been out of his medicines for approximately 1 year, was unable to get transportation to see his cardiologist. -In the ED BP was in the 200s, chest x-ray was suggestive of CHF, troponin 55, 58 -Improved with diuresis, cardiology consulted, echo with EF of 40-45%  Subjective: -Feels better, breathing has improved  Assessment and Plan:  Acute on chronic systolic, diastolic CHF -Previously EF of 30% in 2019, never had ischemic eval, echo in 03/2018 improved to 50-55% with grade 2 DD, severe LVH -Repeat echo with EF of 45%, appreciate cardiology input -Unfortunately with worsening AKI, hold Lasix and SGLT2i -GDMT limited by CKD -Appreciate cardiology input  AKI on CKD3a -Baseline creatinine around 1.8-1.9, creatinine up to 2.8 today -Hold Lasix and SGLT2i -Continue Foley catheter for urinary retention -Monitor urine output, BMP in a.m.  Urinary retention, -History suggestive of BPH -Required Foley catheter placement in the ED on admission -Continue Flomax, will attempt voiding trial tomorrow if kidney function improves  Elevated troponin -55, 58, flat trend, suspect demand ischemia  Hypertensive urgency -BP improving, cut down hydralazine, continue Coreg   PVCs, bigeminy -Replace K, continue Coreg  DVT prophylaxis: Lovenox Code Status: Full code Family Communication: Discussed with patient, wife at bedside and son on the telephone who translated Disposition Plan: Home likely 24 to 48 hours  Consultants:    Procedures:   Antimicrobials:    Objective: Vitals:   10/06/21 0059 10/06/21 0420 10/06/21 0500 10/06/21 0947  BP: (!) 115/55 (!) 114/59  (!) 110/43  Pulse: 64 72  69  Resp: 16 18    Temp: 97.8 F (36.6 C) 97.8  F (36.6 C)    TempSrc: Oral Oral    SpO2: 95% 96%    Weight:   78.4 kg   Height:        Intake/Output Summary (Last 24 hours) at 10/06/2021 1302 Last data filed at 10/06/2021 3545 Gross per 24 hour  Intake 240 ml  Output 900 ml  Net -660 ml   Filed Weights   10/05/21 0515 10/05/21 0925 10/06/21 0500  Weight: 79.9 kg 78.5 kg 78.4 kg    Examination:  General exam: Pleasant male sitting up in bed, AAOx3, no distress HEENT: No JVD CVS: S1-S2, regular rhythm Lungs: Improved air movement, no wheezes today Abdomen: Soft, nontender, bowel sounds present Extremities: No edema Skin: No rashes Psychiatry:  Mood & affect appropriate.     Data Reviewed:   CBC: Recent Labs  Lab 10/04/21 0215 10/05/21 0330 10/06/21 0301  WBC 8.5 12.7* 12.0*  NEUTROABS 5.3  --   --   HGB 13.3 13.9 14.1  HCT 41.6 43.2 42.6  MCV 86.5 83.6 82.9  PLT 165 171 625   Basic Metabolic Panel: Recent Labs  Lab 10/04/21 0215 10/04/21 1702 10/05/21 0330 10/06/21 0301  NA 141 138 138 136  K 4.1 3.4* 3.3* 3.5  CL 107 101 99 98  CO2 '25 25 28 25  '$ GLUCOSE 102* 139* 158* 122*  BUN 21 22 26* 41*  CREATININE 1.93* 1.80* 1.97* 2.82*  CALCIUM 8.8* 9.1 8.9 8.5*  MG  --  2.0  --   --    GFR: Estimated Creatinine Clearance: 23.1 mL/min (A) (by C-G formula based on SCr of 2.82 mg/dL (H)). Liver Function Tests:  No results for input(s): "AST", "ALT", "ALKPHOS", "BILITOT", "PROT", "ALBUMIN" in the last 168 hours. No results for input(s): "LIPASE", "AMYLASE" in the last 168 hours. No results for input(s): "AMMONIA" in the last 168 hours. Coagulation Profile: No results for input(s): "INR", "PROTIME" in the last 168 hours. Cardiac Enzymes: No results for input(s): "CKTOTAL", "CKMB", "CKMBINDEX", "TROPONINI" in the last 168 hours. BNP (last 3 results) No results for input(s): "PROBNP" in the last 8760 hours. HbA1C: Recent Labs    10/04/21 1702  HGBA1C 6.4*   CBG: No results for input(s): "GLUCAP" in  the last 168 hours. Lipid Profile: Recent Labs    10/04/21 1702  CHOL 206*  HDL 52  LDLCALC 141*  TRIG 67  CHOLHDL 4.0   Thyroid Function Tests: Recent Labs    10/05/21 0330  TSH 0.911   Anemia Panel: No results for input(s): "VITAMINB12", "FOLATE", "FERRITIN", "TIBC", "IRON", "RETICCTPCT" in the last 72 hours. Urine analysis:    Component Value Date/Time   COLORURINE STRAW (A) 10/04/2021 1502   APPEARANCEUR CLEAR 10/04/2021 1502   LABSPEC 1.006 10/04/2021 1502   PHURINE 6.0 10/04/2021 1502   GLUCOSEU NEGATIVE 10/04/2021 1502   HGBUR MODERATE (A) 10/04/2021 1502   BILIRUBINUR NEGATIVE 10/04/2021 1502   KETONESUR NEGATIVE 10/04/2021 1502   PROTEINUR NEGATIVE 10/04/2021 1502   NITRITE NEGATIVE 10/04/2021 1502   LEUKOCYTESUR NEGATIVE 10/04/2021 1502   Sepsis Labs: '@LABRCNTIP'$ (procalcitonin:4,lacticidven:4)  ) Recent Results (from the past 240 hour(s))  SARS Coronavirus 2 by RT PCR (hospital order, performed in [place] hospital lab) *cepheid single result test* Anterior Nasal Swab     Status: None   Collection Time: 10/04/21 10:58 AM   Specimen: Anterior Nasal Swab  Result Value Ref Range Status   SARS Coronavirus 2 by RT PCR NEGATIVE NEGATIVE Final    Comment: (NOTE) SARS-CoV-2 target nucleic acids are NOT DETECTED.  The SARS-CoV-2 RNA is generally detectable in upper and lower respiratory specimens during the acute phase of infection. The lowest concentration of SARS-CoV-2 viral copies this assay can detect is 250 copies / mL. A negative result does not preclude SARS-CoV-2 infection and should not be used as the sole basis for treatment or other patient management decisions.  A negative result may occur with improper specimen collection / handling, submission of specimen other than nasopharyngeal swab, presence of viral mutation(s) within the areas targeted by this assay, and inadequate number of viral copies (<250 copies / mL). A negative result must be  combined with clinical observations, patient history, and epidemiological information.  Fact Sheet for Patients:   https://www.patel.info/  Fact Sheet for Healthcare Providers: https://hall.com/  This test is not yet approved or  cleared by the [place] FDA and has been authorized for detection and/or diagnosis of SARS-CoV-2 by FDA under an Emergency Use Authorization (EUA).  This EUA will remain in effect (meaning this test can be used) for the duration of the COVID-19 declaration under Section 564(b)(1) of the Act, 21 U.S.C. section 360bbb-3(b)(1), unless the authorization is terminated or revoked sooner.  Performed at [facility], [place] [address], [place], [place] [address]      Radiology Studies: ECHOCARDIOGRAM COMPLETE  Result Date: 10/05/2021    ECHOCARDIOGRAM REPORT   Patient Name:   Daniel Obrien  Date of Exam: 10/05/2021 Medical Rec #:  [id number]  Height:       67.0 in Accession #:    [id number] Weight:       176.1 lb Date of Birth:  01/06/1951  BSA:          1.916 m Patient Age:    20 years   BP:           132/81 mmHg Patient Gender: M          HR:           75 bpm. Exam Location:  Inpatient Procedure: 2D Echo Indications:    acute systolic chf  History:        Patient has prior history of Echocardiogram examinations, most                 recent 03/23/2018. Cardiomyopathy; Risk Factors:Diabetes and                 Hypertension.  Sonographer:    Johny Chess RDCS Referring Phys: [place]  1. Global hypokinesis worse in the inferior base. Left ventricular ejection fraction, by estimation, is 45 to 50%. The left ventricle has mildly decreased function. The left ventricle has no regional wall motion abnormalities. The left ventricular internal cavity size was mildly dilated. There is mild left ventricular hypertrophy. Left ventricular diastolic parameters were normal.  2. Right ventricular systolic function is  normal. The right ventricular size is normal.  3. Left atrial size was moderately dilated.  4. The mitral valve is normal in structure. No evidence of mitral valve regurgitation. No evidence of mitral stenosis.  5. The aortic valve is tricuspid. There is mild calcification of the aortic valve. There is mild thickening of the aortic valve. Aortic valve regurgitation is mild. Aortic valve sclerosis is present, with no evidence of aortic valve stenosis.  6. The inferior vena cava is normal in size with greater than 50% respiratory variability, suggesting right atrial pressure of 3 mmHg. FINDINGS  Left Ventricle: Global hypokinesis worse in the inferior base. Left ventricular ejection fraction, by estimation, is 45 to 50%. The left ventricle has mildly decreased function. The left ventricle has no regional wall motion abnormalities. The left ventricular internal cavity size was mildly dilated. There is mild left ventricular hypertrophy. Left ventricular diastolic parameters were normal. Right Ventricle: The right ventricular size is normal. No increase in right ventricular wall thickness. Right ventricular systolic function is normal. Left Atrium: Left atrial size was moderately dilated. Right Atrium: Right atrial size was normal in size. Pericardium: There is no evidence of pericardial effusion. Mitral Valve: The mitral valve is normal in structure. No evidence of mitral valve regurgitation. No evidence of mitral valve stenosis. Tricuspid Valve: The tricuspid valve is normal in structure. Tricuspid valve regurgitation is mild . No evidence of tricuspid stenosis. Aortic Valve: The aortic valve is tricuspid. There is mild calcification of the aortic valve. There is mild thickening of the aortic valve. Aortic valve regurgitation is mild. Aortic valve sclerosis is present, with no evidence of aortic valve stenosis. Pulmonic Valve: The pulmonic valve was normal in structure. Pulmonic valve regurgitation is not visualized.  No evidence of pulmonic stenosis. Aorta: The aortic root is normal in size and structure. Venous: The inferior vena cava is normal in size with greater than 50% respiratory variability, suggesting right atrial pressure of 3 mmHg. IAS/Shunts: No atrial level shunt detected by color flow Doppler.  LEFT VENTRICLE PLAX 2D LVIDd:         6.00 cm      Diastology LVIDs:         4.50 cm      LV e' medial:    4.57  cm/s LV PW:         1.10 cm      LV E/e' medial:  12.7 LV IVS:        1.20 cm      LV e' lateral:   10.40 cm/s                             LV E/e' lateral: 5.6  LV Volumes (MOD) LV vol d, MOD A2C: 143.0 ml LV vol d, MOD A4C: 144.0 ml LV vol s, MOD A2C: 81.6 ml LV vol s, MOD A4C: 78.1 ml LV SV MOD A2C:     61.4 ml LV SV MOD A4C:     144.0 ml LV SV MOD BP:      65.0 ml RIGHT VENTRICLE             IVC RV S prime:     11.40 cm/s  IVC diam: 2.10 cm TAPSE (M-mode): 2.0 cm LEFT ATRIUM             Index        RIGHT ATRIUM           Index LA diam:        4.90 cm 2.56 cm/m   RA Area:     17.60 cm LA Vol (A2C):   90.9 ml 47.44 ml/m  RA Volume:   44.80 ml  23.38 ml/m LA Vol (A4C):   79.8 ml 41.65 ml/m LA Biplane Vol: 86.6 ml 45.20 ml/m  AORTIC VALVE LVOT Vmax:   86.50 cm/s LVOT Vmean:  57.100 cm/s LVOT VTI:    0.165 m  AORTA Ao Asc diam: 3.70 cm MITRAL VALVE MV Area (PHT): 3.37 cm    SHUNTS MV Decel Time: 225 msec    Systemic VTI: 0.16 m MV E velocity: 58.20 cm/s MV A velocity: 58.70 cm/s MV E/A ratio:  0.99 Jenkins Rouge MD Electronically signed by Jenkins Rouge MD Signature Date/Time: 10/05/2021/3:28:45 PM    Final      Scheduled Meds:  aspirin EC  81 mg Oral Daily   atorvastatin  10 mg Oral Daily   carvedilol  12.5 mg Oral BID WC   Chlorhexidine Gluconate Cloth  6 each Topical Q0600   docusate sodium  100 mg Oral BID   enoxaparin (LOVENOX) injection  40 mg Subcutaneous Daily   hydrALAZINE  10 mg Oral TID   potassium chloride  40 mEq Oral BID   sodium chloride flush  3 mL Intravenous Q12H   tamsulosin  0.4  mg Oral Daily   Continuous Infusions:   LOS: 2 days    Time spent: 52mn  PDomenic Polite MD [facility]   10/06/2021, 1:02 PM

## 2021-10-06 NOTE — Plan of Care (Signed)
°  Problem: Education: °Goal: Ability to demonstrate management of disease process will improve °Outcome: Progressing °  °Problem: Education: °Goal: Ability to verbalize understanding of medication therapies will improve °Outcome: Progressing °  °Problem: Education: °Goal: Individualized Educational Video(s) °Outcome: Progressing °  °Problem: Activity: °Goal: Capacity to carry out activities will improve °Outcome: Progressing °  °

## 2021-10-07 DIAGNOSIS — I5023 Acute on chronic systolic (congestive) heart failure: Secondary | ICD-10-CM | POA: Diagnosis not present

## 2021-10-07 LAB — BASIC METABOLIC PANEL
Anion gap: 10 (ref 5–15)
BUN: 55 mg/dL — ABNORMAL HIGH (ref 8–23)
CO2: 25 mmol/L (ref 22–32)
Calcium: 8.4 mg/dL — ABNORMAL LOW (ref 8.9–10.3)
Chloride: 102 mmol/L (ref 98–111)
Creatinine, Ser: 2.71 mg/dL — ABNORMAL HIGH (ref 0.61–1.24)
GFR, Estimated: 25 mL/min — ABNORMAL LOW (ref >60)
Glucose, Bld: 113 mg/dL — ABNORMAL HIGH (ref 70–99)
Potassium: 3.7 mmol/L (ref 3.5–5.1)
Sodium: 137 mmol/L (ref 135–145)

## 2021-10-07 LAB — CBC
HCT: 42.3 % (ref 39.0–52.0)
Hemoglobin: 14.1 g/dL (ref 13.0–17.0)
MCH: 27.6 pg (ref 26.0–34.0)
MCHC: 33.3 g/dL (ref 30.0–36.0)
MCV: 82.8 fL (ref 80.0–100.0)
Platelets: 181 10*3/uL (ref 150–400)
RBC: 5.11 MIL/uL (ref 4.22–5.81)
RDW: 14.6 % (ref 11.5–15.5)
WBC: 10.6 10*3/uL — ABNORMAL HIGH (ref 4.0–10.5)
nRBC: 0 % (ref 0.0–0.2)

## 2021-10-07 MED ORDER — CARVEDILOL 6.25 MG PO TABS
6.2500 mg | ORAL_TABLET | Freq: Two times a day (BID) | ORAL | Status: DC
  Administered 2021-10-07 – 2021-10-09 (×4): 6.25 mg via ORAL
  Filled 2021-10-07 (×4): qty 1

## 2021-10-07 MED ORDER — POLYETHYLENE GLYCOL 3350 17 G PO PACK
17.0000 g | PACK | Freq: Every day | ORAL | Status: DC
  Administered 2021-10-07 – 2021-10-08 (×2): 17 g via ORAL
  Filled 2021-10-07 (×3): qty 1

## 2021-10-07 NOTE — Progress Notes (Signed)
PROGRESS NOTE    Daniel Obrien  CVE:[id number] DOB: April 04, 1950 DOA: 10/04/2021 PCP: [facility]  71/M with history of chronic systolic CHF, diabetes, hypertension, hypothyroidism presented to the ED with shortness of breath, he has been out of his medicines for approximately 1 year, was unable to get transportation to see his cardiologist. -In the ED BP was in the 200s, chest x-ray was suggestive of CHF, troponin 55, 58 -Improved with diuresis, cardiology consulted, echo with EF of 40-45% -Hospital course complicated by worsening AKI  Subjective: -Has some abdominal discomfort, unable to have a bowel movement, breathing better, denies any other complaints  Assessment and Plan:  Acute on chronic systolic, diastolic CHF -Previously EF of 30% in 2019, never had ischemic eval, echo in 03/2018 improved to 50-55% with grade 2 DD, severe LVH -Repeat echo with EF of 45%, appreciate cardiology input -Unfortunately with worsening AKI, held Lasix and SGLT2i -GDMT limited by CKD -Appreciate cardiology input, BMP in a.m.  AKI on CKD3a -Baseline creatinine around 1.8-1.9, creatinine up to 2.8 yesterday, now 2.7 -Hold Lasix and SGLT2i, suspect hemodynamically mediated, discontinued hydralazine -Continue Foley catheter for urinary retention -Monitor urine output, BMP in a.m.  Urinary retention, -History suggestive of BPH -Required Foley catheter placement in the ED on admission -Continue Flomax, attempt voiding trial tomorrow if kidney function improves  Elevated troponin -55, 58, flat trend, suspect demand ischemia  Hypertensive urgency -BP improving, stop hydralazine, decrease Coreg dose    PVCs, bigeminy -Replace K, continue Coreg  DVT prophylaxis: Lovenox Code Status: Full code Family Communication: Discussed with patient, wife at bedside and son on the telephone who translated Disposition Plan: Home likely 24 to 48 hours  Consultants:  Cards  Procedures:    Antimicrobials:    Objective: Vitals:   10/06/21 2030 10/07/21 0005 10/07/21 0553 10/07/21 0916  BP: (!) 94/46 (!) 99/47 (!) 131/54 (!) 123/46  Pulse: 67 (!) 34 (!) 34 60  Resp: '18 18 18 18  '$ Temp: 97.8 F (36.6 C) 97.8 F (36.6 C) 97.8 F (36.6 C)   TempSrc: Oral Oral Oral   SpO2: 93% 93% 100% 96%  Weight:   81.7 kg   Height:        Intake/Output Summary (Last 24 hours) at 10/07/2021 1247 Last data filed at 10/07/2021 0500 Gross per 24 hour  Intake 240 ml  Output 1000 ml  Net -760 ml   Filed Weights   10/05/21 0925 10/06/21 0500 10/07/21 0553  Weight: 78.5 kg 78.4 kg 81.7 kg    Examination:  General exam: Pleasant male, sitting up in bed, AAOx3, no distress HEENT: No JVD CVS: S1-S2, regular rhythm Lungs: Improving air movement, no wheezes Abdomen: Soft, nontender, bowel sounds present Extremities: No edema Skin: No rashes Psychiatry:  Mood & affect appropriate.     Data Reviewed:   CBC: Recent Labs  Lab 10/04/21 0215 10/05/21 0330 10/06/21 0301 10/07/21 0450  WBC 8.5 12.7* 12.0* 10.6*  NEUTROABS 5.3  --   --   --   HGB 13.3 13.9 14.1 14.1  HCT 41.6 43.2 42.6 42.3  MCV 86.5 83.6 82.9 82.8  PLT 165 171 164 025   Basic Metabolic Panel: Recent Labs  Lab 10/04/21 0215 10/04/21 1702 10/05/21 0330 10/06/21 0301 10/07/21 0450  NA 141 138 138 136 137  K 4.1 3.4* 3.3* 3.5 3.7  CL 107 101 99 98 102  CO2 '25 25 28 25 25  '$ GLUCOSE 102* 139* 158* 122* 113*  BUN 21  22 26* 41* 55*  CREATININE 1.93* 1.80* 1.97* 2.82* 2.71*  CALCIUM 8.8* 9.1 8.9 8.5* 8.4*  MG  --  2.0  --   --   --    GFR: Estimated Creatinine Clearance: 26.3 mL/min (A) (by C-G formula based on SCr of 2.71 mg/dL (H)). Liver Function Tests: No results for input(s): "AST", "ALT", "ALKPHOS", "BILITOT", "PROT", "ALBUMIN" in the last 168 hours. No results for input(s): "LIPASE", "AMYLASE" in the last 168 hours. No results for input(s): "AMMONIA" in the last 168 hours. Coagulation  Profile: No results for input(s): "INR", "PROTIME" in the last 168 hours. Cardiac Enzymes: No results for input(s): "CKTOTAL", "CKMB", "CKMBINDEX", "TROPONINI" in the last 168 hours. BNP (last 3 results) No results for input(s): "PROBNP" in the last 8760 hours. HbA1C: Recent Labs    10/04/21 1702  HGBA1C 6.4*   CBG: No results for input(s): "GLUCAP" in the last 168 hours. Lipid Profile: Recent Labs    10/04/21 1702  CHOL 206*  HDL 52  LDLCALC 141*  TRIG 67  CHOLHDL 4.0   Thyroid Function Tests: Recent Labs    10/05/21 0330  TSH 0.911   Anemia Panel: No results for input(s): "VITAMINB12", "FOLATE", "FERRITIN", "TIBC", "IRON", "RETICCTPCT" in the last 72 hours. Urine analysis:    Component Value Date/Time   COLORURINE STRAW (A) 10/04/2021 1502   APPEARANCEUR CLEAR 10/04/2021 1502   LABSPEC 1.006 10/04/2021 1502   PHURINE 6.0 10/04/2021 1502   GLUCOSEU NEGATIVE 10/04/2021 1502   HGBUR MODERATE (A) 10/04/2021 1502   BILIRUBINUR NEGATIVE 10/04/2021 1502   KETONESUR NEGATIVE 10/04/2021 1502   PROTEINUR NEGATIVE 10/04/2021 1502   NITRITE NEGATIVE 10/04/2021 1502   LEUKOCYTESUR NEGATIVE 10/04/2021 1502   Sepsis Labs: '@LABRCNTIP'$ (procalcitonin:4,lacticidven:4)  ) Recent Results (from the past 240 hour(s))  SARS Coronavirus 2 by RT PCR (hospital order, performed in [facility] lab) *cepheid single result test* Anterior Nasal Swab     Status: None   Collection Time: 10/04/21 10:58 AM   Specimen: Anterior Nasal Swab  Result Value Ref Range Status   SARS Coronavirus 2 by RT PCR NEGATIVE NEGATIVE Final    Comment: (NOTE) SARS-CoV-2 target nucleic acids are NOT DETECTED.  The SARS-CoV-2 RNA is generally detectable in upper and lower respiratory specimens during the acute phase of infection. The lowest concentration of SARS-CoV-2 viral copies this assay can detect is 250 copies / mL. A negative result does not preclude SARS-CoV-2 infection and should not be  used as the sole basis for treatment or other patient management decisions.  A negative result may occur with improper specimen collection / handling, submission of specimen other than nasopharyngeal swab, presence of viral mutation(s) within the areas targeted by this assay, and inadequate number of viral copies (<250 copies / mL). A negative result must be combined with clinical observations, patient history, and epidemiological information.  Fact Sheet for Patients:   https://www.patel.info/  Fact Sheet for Healthcare Providers: https://hall.com/  This test is not yet approved or  cleared by the [place] FDA and has been authorized for detection and/or diagnosis of SARS-CoV-2 by FDA under an Emergency Use Authorization (EUA).  This EUA will remain in effect (meaning this test can be used) for the duration of the COVID-19 declaration under Section 564(b)(1) of the Act, 21 U.S.C. section 360bbb-3(b)(1), unless the authorization is terminated or revoked sooner.  Performed at [facility], [place] [address], [place], [place] [address]      Radiology Studies: ECHOCARDIOGRAM COMPLETE  Result Date: 10/05/2021    ECHOCARDIOGRAM REPORT   Patient Name:   FORNEY KLEINPETER  Date of Exam: 10/05/2021 Medical Rec #:  [id number]  Height:       67.0 in Accession #:    [id number] Weight:       176.1 lb Date of Birth:  1950-08-23  BSA:          1.916 m Patient Age:    71 years   BP:           132/81 mmHg Patient Gender: M          HR:           75 bpm. Exam Location:  Inpatient Procedure: 2D Echo Indications:    acute systolic chf  History:        Patient has prior history of Echocardiogram examinations, most                 recent 03/23/2018. Cardiomyopathy; Risk Factors:Diabetes and                 Hypertension.  Sonographer:    Johny Chess RDCS Referring Phys: [place]  1. Global hypokinesis worse in the inferior base. Left  ventricular ejection fraction, by estimation, is 45 to 50%. The left ventricle has mildly decreased function. The left ventricle has no regional wall motion abnormalities. The left ventricular internal cavity size was mildly dilated. There is mild left ventricular hypertrophy. Left ventricular diastolic parameters were normal.  2. Right ventricular systolic function is normal. The right ventricular size is normal.  3. Left atrial size was moderately dilated.  4. The mitral valve is normal in structure. No evidence of mitral valve regurgitation. No evidence of mitral stenosis.  5. The aortic valve is tricuspid. There is mild calcification of the aortic valve. There is mild thickening of the aortic valve. Aortic valve regurgitation is mild. Aortic valve sclerosis is present, with no evidence of aortic valve stenosis.  6. The inferior vena cava is normal in size with greater than 50% respiratory variability, suggesting right atrial pressure of 3 mmHg. FINDINGS  Left Ventricle: Global hypokinesis worse in the inferior base. Left ventricular ejection fraction, by estimation, is 45 to 50%. The left ventricle has mildly decreased function. The left ventricle has no regional wall motion abnormalities. The left ventricular internal cavity size was mildly dilated. There is mild left ventricular hypertrophy. Left ventricular diastolic parameters were normal. Right Ventricle: The right ventricular size is normal. No increase in right ventricular wall thickness. Right ventricular systolic function is normal. Left Atrium: Left atrial size was moderately dilated. Right Atrium: Right atrial size was normal in size. Pericardium: There is no evidence of pericardial effusion. Mitral Valve: The mitral valve is normal in structure. No evidence of mitral valve regurgitation. No evidence of mitral valve stenosis. Tricuspid Valve: The tricuspid valve is normal in structure. Tricuspid valve regurgitation is mild . No evidence of tricuspid  stenosis. Aortic Valve: The aortic valve is tricuspid. There is mild calcification of the aortic valve. There is mild thickening of the aortic valve. Aortic valve regurgitation is mild. Aortic valve sclerosis is present, with no evidence of aortic valve stenosis. Pulmonic Valve: The pulmonic valve was normal in structure. Pulmonic valve regurgitation is not visualized. No evidence of pulmonic stenosis. Aorta: The aortic root is normal in size and structure. Venous: The inferior vena cava is normal in size with greater than 50% respiratory variability, suggesting right atrial pressure of 3 mmHg.  IAS/Shunts: No atrial level shunt detected by color flow Doppler.  LEFT VENTRICLE PLAX 2D LVIDd:         6.00 cm      Diastology LVIDs:         4.50 cm      LV e' medial:    4.57 cm/s LV PW:         1.10 cm      LV E/e' medial:  12.7 LV IVS:        1.20 cm      LV e' lateral:   10.40 cm/s                             LV E/e' lateral: 5.6  LV Volumes (MOD) LV vol d, MOD A2C: 143.0 ml LV vol d, MOD A4C: 144.0 ml LV vol s, MOD A2C: 81.6 ml LV vol s, MOD A4C: 78.1 ml LV SV MOD A2C:     61.4 ml LV SV MOD A4C:     144.0 ml LV SV MOD BP:      65.0 ml RIGHT VENTRICLE             IVC RV S prime:     11.40 cm/s  IVC diam: 2.10 cm TAPSE (M-mode): 2.0 cm LEFT ATRIUM             Index        RIGHT ATRIUM           Index LA diam:        4.90 cm 2.56 cm/m   RA Area:     17.60 cm LA Vol (A2C):   90.9 ml 47.44 ml/m  RA Volume:   44.80 ml  23.38 ml/m LA Vol (A4C):   79.8 ml 41.65 ml/m LA Biplane Vol: 86.6 ml 45.20 ml/m  AORTIC VALVE LVOT Vmax:   86.50 cm/s LVOT Vmean:  57.100 cm/s LVOT VTI:    0.165 m  AORTA Ao Asc diam: 3.70 cm MITRAL VALVE MV Area (PHT): 3.37 cm    SHUNTS MV Decel Time: 225 msec    Systemic VTI: 0.16 m MV E velocity: 58.20 cm/s MV A velocity: 58.70 cm/s MV E/A ratio:  0.99 Jenkins Rouge MD Electronically signed by Jenkins Rouge MD Signature Date/Time: 10/05/2021/3:28:45 PM    Final      Scheduled Meds:  aspirin EC  81  mg Oral Daily   atorvastatin  10 mg Oral Daily   carvedilol  6.25 mg Oral BID WC   Chlorhexidine Gluconate Cloth  6 each Topical Q0600   docusate sodium  100 mg Oral BID   enoxaparin (LOVENOX) injection  30 mg Subcutaneous Daily   sodium chloride flush  3 mL Intravenous Q12H   tamsulosin  0.4 mg Oral Daily   Continuous Infusions:   LOS: 3 days    Time spent: 64mn  PDomenic Polite MD [facility]   10/07/2021, 12:47 PM

## 2021-10-08 DIAGNOSIS — I5023 Acute on chronic systolic (congestive) heart failure: Secondary | ICD-10-CM | POA: Diagnosis not present

## 2021-10-08 LAB — CBC
HCT: 42.4 % (ref 39.0–52.0)
Hemoglobin: 14 g/dL (ref 13.0–17.0)
MCH: 27.5 pg (ref 26.0–34.0)
MCHC: 33 g/dL (ref 30.0–36.0)
MCV: 83.1 fL (ref 80.0–100.0)
Platelets: 195 10*3/uL (ref 150–400)
RBC: 5.1 MIL/uL (ref 4.22–5.81)
RDW: 14.4 % (ref 11.5–15.5)
WBC: 9.4 10*3/uL (ref 4.0–10.5)
nRBC: 0 % (ref 0.0–0.2)

## 2021-10-08 LAB — BASIC METABOLIC PANEL
Anion gap: 9 (ref 5–15)
BUN: 57 mg/dL — ABNORMAL HIGH (ref 8–23)
CO2: 26 mmol/L (ref 22–32)
Calcium: 8.5 mg/dL — ABNORMAL LOW (ref 8.9–10.3)
Chloride: 103 mmol/L (ref 98–111)
Creatinine, Ser: 2.55 mg/dL — ABNORMAL HIGH (ref 0.61–1.24)
GFR, Estimated: 26 mL/min — ABNORMAL LOW (ref >60)
Glucose, Bld: 127 mg/dL — ABNORMAL HIGH (ref 70–99)
Potassium: 4 mmol/L (ref 3.5–5.1)
Sodium: 138 mmol/L (ref 135–145)

## 2021-10-08 NOTE — Progress Notes (Signed)
Physical Therapy Treatment Patient Details Name: Daniel Obrien MRN: [id number] DOB: 1950/09/15 Today's Date: 10/08/2021   History of Present Illness 71 y.o. male presents to [facility] hospital on 10/04/2021 with SOB, has not been taking CHF medications per family. PMH includes CHF, ARF, DM, HTN.    PT Comments    Pt mildly unsteady during gait today and was noted to be more stable last visit, pt attributes this to discomfort at head of penis and did have noted widened stance. Pt without overt LOB but was unsteady with quick changes in direction. Pt ambulated 400' and ascended a flight of stairs with VSS on RA. PT will continue to follow.    Recommendations for follow up therapy are one component of a multi-disciplinary discharge planning process, led by the attending physician.  Recommendations may be updated based on patient status, additional functional criteria and insurance authorization.  Follow Up Recommendations  No PT follow up     Assistance Recommended at Discharge PRN  Patient can return home with the following Assistance with cooking/housework;Assist for transportation;Help with stairs or ramp for entrance   Equipment Recommendations  None recommended by PT    Recommendations for Other Services       Precautions / Restrictions Precautions Precautions: Other (comment);Fall Precaution Comments: monitor sats and RR Restrictions Weight Bearing Restrictions: No     Mobility  Bed Mobility               General bed mobility comments: pt up in chair    Transfers Overall transfer level: Needs assistance Equipment used: None Transfers: Sit to/from Stand Sit to Stand: Modified independent (Device/Increase time)           General transfer comment: pt stood safely from chair without use of hands and no LOB    Ambulation/Gait Ambulation/Gait assistance: Min guard Gait Distance (Feet): 400 Feet Assistive device: None Gait Pattern/deviations: Step-through pattern Gait  velocity: functional Gait velocity interpretation: >2.62 ft/sec, indicative of community ambulatory   General Gait Details: pt mildly unsteady during gait, he reports this is due to pain he is having around his penis though wife indicates that she has noticed some instability at home. Discussed possibility of cane though pt reports he has used before and does not like it. No overt LOB but unsteadiness noted with quick changes in direction.   Stairs Stairs: Yes Stairs assistance: Min guard Stair Management: Alternating pattern, Forwards, Two rails Number of Stairs: 10 General stair comments: pt safe with use of rails. Hesitant with descent, seemed to be due to visual deficits   Wheelchair Mobility    Modified Rankin (Stroke Patients Only)       Balance Overall balance assessment: Needs assistance Sitting-balance support: No upper extremity supported, Feet supported Sitting balance-Leahy Scale: Good     Standing balance support: No upper extremity supported, During functional activity Standing balance-Leahy Scale: Good                              Cognition Arousal/Alertness: Awake/alert Behavior During Therapy: WFL for tasks assessed/performed Overall Cognitive Status: Difficult to assess                                 General Comments: follows commands well but communication limited by pt speaking [place] and no interpreter available for that dialect of [place]        Exercises  General Comments General comments (skin integrity, edema, etc.): VSS on RA      Pertinent Vitals/Pain Pain Assessment Pain Assessment: Faces Faces Pain Scale: Hurts little more Pain Location: penis Pain Descriptors / Indicators: Sore Pain Intervention(s): Monitored during session    Home Living                          Prior Function            PT Goals (current goals can now be found in the care plan section) Acute Rehab PT  Goals Patient Stated Goal: to go home PT Goal Formulation: With patient/family Time For Goal Achievement: 10/19/21 Potential to Achieve Goals: Good Progress towards PT goals: Progressing toward goals    Frequency    Min 3X/week      PT Plan Current plan remains appropriate    Co-evaluation              AM-PAC PT "6 Clicks" Mobility   Outcome Measure  Help needed turning from your back to your side while in a flat bed without using bedrails?: None Help needed moving from lying on your back to sitting on the side of a flat bed without using bedrails?: None Help needed moving to and from a bed to a chair (including a wheelchair)?: A Little Help needed standing up from a chair using your arms (e.g., wheelchair or bedside chair)?: A Little Help needed to walk in hospital room?: A Little Help needed climbing 3-5 steps with a railing? : A Little 6 Click Score: 20    End of Session   Activity Tolerance: Patient tolerated treatment well Patient left: in chair;with call bell/phone within reach;with family/visitor present Nurse Communication: Mobility status PT Visit Diagnosis: Other abnormalities of gait and mobility (R26.89)     Time: 4034-7425 PT Time Calculation (min) (ACUTE ONLY): 15 min  Charges:  $Gait Training: 8-22 mins                     [place] chat preferred Office [place] 10/08/2021, 11:37 AM

## 2021-10-08 NOTE — Plan of Care (Signed)
  Problem: Education: Goal: Ability to demonstrate management of disease process will improve Outcome: Progressing   Problem: Education: Goal: Ability to verbalize understanding of medication therapies will improve Outcome: Progressing   Problem: Education: Goal: Individualized Educational Video(s) Outcome: Progressing   Problem: Activity: Goal: Capacity to carry out activities will improve Outcome: Progressing   Problem: Cardiac: Goal: Ability to achieve and maintain adequate cardiopulmonary perfusion will improve Outcome: Progressing

## 2021-10-08 NOTE — Progress Notes (Signed)
Heart Failure Stewardship Pharmacist Progress Note   PCP: [facility] PCP-Cardiologist: None    HPI:  71 yo M with PMH of CHF, T2DM, and HTN.   First established care with cardiology in 2019.  Patient was admitted to the hospital in 12/2017 for evaluation of hypertensive urgency and chest pain. ECHO on 01/14/2018 that showed EF 30%. Patient was fluid overloaded, attempted diuresis with Lasix however creatinine increased from 1.4 to 2.1.   Repeat echocardiogram on 03/23/2018 showed that EF improved to 50-55%.   He presented to the ED on 7/20 with shortness of breath, orthopnea, and PND. He was not taking any meds PTA. CXR with cardiomegaly and pulmonary vascular congestion. ECHO 10/05/2021 with EF 45-50%, mild LVH. Management has been limited by AKI on CKD (SCr peak 2.82).   Current HF Medications: Beta Blocker: carvedilol 6.25 mg BID  Prior to admission HF Medications: None  Pertinent Lab Values: Serum creatinine 2.55, BUN 57, Potassium 4.0, Sodium 138, BNP 1637.7, Magnesium 2.0, A1c 6.4   Vital Signs: Weight: 180 lbs (admission weight: 176 lbs) Blood pressure: 120/60s  Heart rate: 60-70s  I/O: -1.4L yesterday; net -4.6L  Medication Assistance / Insurance Benefits Check: Does the patient have prescription insurance?  No  Does the patient qualify for medication assistance through manufacturers or grants?   Pending Eligible grants and/or patient assistance programs: pending Medication assistance applications in progress: none  Medication assistance applications approved: none Approved medication assistance renewals will be completed by: pending  Outpatient Pharmacy:  Prior to admission outpatient pharmacy: [facility] Is the patient willing to use [place] pharmacy at discharge? Yes Is the patient willing to transition their outpatient pharmacy to utilize a [facility] outpatient pharmacy?   Pending    Assessment: 1. Acute on chronic systolic and diastolic  CHF (LVEF 83-41%). NYHA class III symptoms. - No edema or JVD on exam. Furosemide PRN at discharge. - Continue carvedilol 6.25 mg BID - No ACE/ARB/ARNI, MRA, or SGLT2i with AKI on CKD. Also has urinary retention with foley removed today.    Plan: 1) Medication changes recommended at this time: - Continue current regimen pending further improvement in renal function.  2) Patient assistance: - Uninsured - will complete assistance applications as indicated  3)  Education  - To be completed prior to discharge  Kerby Nora, PharmD, BCPS Heart Failure Cytogeneticist Phone [phone]

## 2021-10-08 NOTE — Progress Notes (Signed)
Patient complained of pain at the tip of his penis earlier during assessment. Patient has foley catheter due to urinary retention. MD notified, verbal order given to remove foley and monitor urine output. Foley catheter removed without any difficulty. Patient provided with urinal. Staff will monitor patient's urine output. Marcille Blanco, RN

## 2021-10-08 NOTE — Progress Notes (Signed)
PROGRESS NOTE    Daniel Obrien  ONG:[id number] DOB: [birth year] DOA: 10/04/2021 PCP: [facility]  71/M with history of chronic systolic CHF, diabetes, hypertension, hypothyroidism presented to the ED with shortness of breath, he has been out of his medicines for approximately 1 year, was unable to get transportation to see his cardiologist. -In the ED BP was in the 200s, chest x-ray was suggestive of CHF, troponin 55, 58 -Improved with diuresis, cardiology consulted, echo with EF of 40-45% -Hospital course complicated by worsening AKI  Subjective: -Uncomfortable with the catheter, finally had BM yesterday, breathing is better  Assessment and Plan:  Acute on chronic systolic, diastolic CHF -Previously EF of 30% in 2019, never had ischemic eval, echo in 03/2018 improved to 50-55% with grade 2 DD, severe LVH -Repeat echo with EF of 45%, appreciate cardiology input -Unfortunately with worsening AKI, held Lasix and SGLT2i -GDMT limited by CKD, cardiology signed off -Creatinine improving, resume Lasix as needed at discharge  AKI on CKD3a -Baseline creatinine around 1.8-1.9, creatinine up to 2.8, finally improving, now 2.5 -Held Lasix and SGLT2i, suspect hemodynamically mediated, discontinued hydralazine -Remove Foley catheter, voiding trial  Urinary retention, -History suggestive of BPH -Required Foley catheter placement in the ED on admission -Continue Flomax, will attempt voiding trial today, having a lot of discomfort with Foley catheter  Elevated troponin -55, 58, flat trend, suspect demand ischemia  Hypertensive urgency -BP improving, stop hydralazine, decrease Coreg dose    PVCs, bigeminy -Replace K, continue Coreg  DVT prophylaxis: Lovenox Code Status: Full code Family Communication: Discussed with patient, wife at bedside used iPad interpreter  disposition Plan: Home likely 24 to 48 hours  Consultants:  Cards  Procedures:   Antimicrobials:     Objective: Vitals:   10/07/21 2313 10/08/21 0542 10/08/21 0844 10/08/21 1132  BP: (!) 135/55 (!) 141/80 120/62 124/61  Pulse: 73 76 68 64  Resp: '18 18  20  '$ Temp: 98 F (36.7 C) 98.2 F (36.8 C)  98.5 F (36.9 C)  TempSrc:  Oral  Oral  SpO2: 94% 94%  99%  Weight:  81.9 kg    Height:        Intake/Output Summary (Last 24 hours) at 10/08/2021 1219 Last data filed at 10/08/2021 1051 Gross per 24 hour  Intake 1000 ml  Output 1775 ml  Net -775 ml   Filed Weights   10/06/21 0500 10/07/21 0553 10/08/21 0542  Weight: 78.4 kg 81.7 kg 81.9 kg    Examination:  General exam: Pleasant male sitting up in bed, AAOx3, no distress HEENT: No JVD CVS: S1-S2, regular rhythm Lungs: Improving air movement, no wheezes Abdomen: Soft, nontender, bowel sounds present Extremities: No edema  Skin: No rashes Psychiatry:  Mood & affect appropriate.     Data Reviewed:   CBC: Recent Labs  Lab 10/04/21 0215 10/05/21 0330 10/06/21 0301 10/07/21 0450 10/08/21 0611  WBC 8.5 12.7* 12.0* 10.6* 9.4  NEUTROABS 5.3  --   --   --   --   HGB 13.3 13.9 14.1 14.1 14.0  HCT 41.6 43.2 42.6 42.3 42.4  MCV 86.5 83.6 82.9 82.8 83.1  PLT 165 171 164 181 440   Basic Metabolic Panel: Recent Labs  Lab 10/04/21 1702 10/05/21 0330 10/06/21 0301 10/07/21 0450 10/08/21 0611  NA 138 138 136 137 138  K 3.4* 3.3* 3.5 3.7 4.0  CL 101 99 98 102 103  CO2 '25 28 25 25 26  '$ GLUCOSE 139* 158* 122* 113* 127*  BUN 22 26* 41* 55* 57*  CREATININE 1.80* 1.97* 2.82* 2.71* 2.55*  CALCIUM 9.1 8.9 8.5* 8.4* 8.5*  MG 2.0  --   --   --   --    GFR: Estimated Creatinine Clearance: 28 mL/min (A) (by C-G formula based on SCr of 2.55 mg/dL (H)). Liver Function Tests: No results for input(s): "AST", "ALT", "ALKPHOS", "BILITOT", "PROT", "ALBUMIN" in the last 168 hours. No results for input(s): "LIPASE", "AMYLASE" in the last 168 hours. No results for input(s): "AMMONIA" in the last 168 hours. Coagulation Profile: No  results for input(s): "INR", "PROTIME" in the last 168 hours. Cardiac Enzymes: No results for input(s): "CKTOTAL", "CKMB", "CKMBINDEX", "TROPONINI" in the last 168 hours. BNP (last 3 results) No results for input(s): "PROBNP" in the last 8760 hours. HbA1C: No results for input(s): "HGBA1C" in the last 72 hours.  CBG: No results for input(s): "GLUCAP" in the last 168 hours. Lipid Profile: No results for input(s): "CHOL", "HDL", "LDLCALC", "TRIG", "CHOLHDL", "LDLDIRECT" in the last 72 hours.  Thyroid Function Tests: No results for input(s): "TSH", "T4TOTAL", "FREET4", "T3FREE", "THYROIDAB" in the last 72 hours.  Anemia Panel: No results for input(s): "VITAMINB12", "FOLATE", "FERRITIN", "TIBC", "IRON", "RETICCTPCT" in the last 72 hours. Urine analysis:    Component Value Date/Time   COLORURINE STRAW (A) 10/04/2021 1502   APPEARANCEUR CLEAR 10/04/2021 1502   LABSPEC 1.006 10/04/2021 1502   PHURINE 6.0 10/04/2021 1502   GLUCOSEU NEGATIVE 10/04/2021 1502   HGBUR MODERATE (A) 10/04/2021 1502   BILIRUBINUR NEGATIVE 10/04/2021 1502   KETONESUR NEGATIVE 10/04/2021 1502   PROTEINUR NEGATIVE 10/04/2021 1502   NITRITE NEGATIVE 10/04/2021 1502   LEUKOCYTESUR NEGATIVE 10/04/2021 1502   Sepsis Labs: '@LABRCNTIP'$ (procalcitonin:4,lacticidven:4)  ) Recent Results (from the past 240 hour(s))  SARS Coronavirus 2 by RT PCR (hospital order, performed in [place] hospital lab) *cepheid single result test* Anterior Nasal Swab     Status: None   Collection Time: 10/04/21 10:58 AM   Specimen: Anterior Nasal Swab  Result Value Ref Range Status   SARS Coronavirus 2 by RT PCR NEGATIVE NEGATIVE Final    Comment: (NOTE) SARS-CoV-2 target nucleic acids are NOT DETECTED.  The SARS-CoV-2 RNA is generally detectable in upper and lower respiratory specimens during the acute phase of infection. The lowest concentration of SARS-CoV-2 viral copies this assay can detect is 250 copies / mL. A negative result  does not preclude SARS-CoV-2 infection and should not be used as the sole basis for treatment or other patient management decisions.  A negative result may occur with improper specimen collection / handling, submission of specimen other than nasopharyngeal swab, presence of viral mutation(s) within the areas targeted by this assay, and inadequate number of viral copies (<250 copies / mL). A negative result must be combined with clinical observations, patient history, and epidemiological information.  Fact Sheet for Patients:   https://www.patel.info/  Fact Sheet for Healthcare Providers: https://hall.com/  This test is not yet approved or  cleared by the [place] FDA and has been authorized for detection and/or diagnosis of SARS-CoV-2 by FDA under an Emergency Use Authorization (EUA).  This EUA will remain in effect (meaning this test can be used) for the duration of the COVID-19 declaration under Section 564(b)(1) of the Act, 21 U.S.C. section 360bbb-3(b)(1), unless the authorization is terminated or revoked sooner.  Performed at [facility], [place] [address], [place], [place] [address]      Radiology Studies: No results found.   Scheduled Meds:  aspirin EC  81 mg Oral Daily   atorvastatin  10 mg Oral Daily   carvedilol  6.25 mg Oral BID WC   docusate sodium  100 mg Oral BID   enoxaparin (LOVENOX) injection  30 mg Subcutaneous Daily   polyethylene glycol  17 g Oral Daily   sodium chloride flush  3 mL Intravenous Q12H   tamsulosin  0.4 mg Oral Daily   Continuous Infusions:   LOS: 4 days    Time spent: 75mn  PDomenic Polite MD [facility]   10/08/2021, 12:19 PM

## 2021-10-09 ENCOUNTER — Other Ambulatory Visit (HOSPITAL_COMMUNITY): Payer: Self-pay

## 2021-10-09 DIAGNOSIS — I5023 Acute on chronic systolic (congestive) heart failure: Secondary | ICD-10-CM | POA: Diagnosis not present

## 2021-10-09 LAB — BASIC METABOLIC PANEL
Anion gap: 9 (ref 5–15)
BUN: 58 mg/dL — ABNORMAL HIGH (ref 8–23)
CO2: 24 mmol/L (ref 22–32)
Calcium: 8.5 mg/dL — ABNORMAL LOW (ref 8.9–10.3)
Chloride: 102 mmol/L (ref 98–111)
Creatinine, Ser: 2.26 mg/dL — ABNORMAL HIGH (ref 0.61–1.24)
GFR, Estimated: 31 mL/min — ABNORMAL LOW (ref >60)
Glucose, Bld: 112 mg/dL — ABNORMAL HIGH (ref 70–99)
Potassium: 4.4 mmol/L (ref 3.5–5.1)
Sodium: 135 mmol/L (ref 135–145)

## 2021-10-09 LAB — CBC
HCT: 42.2 % (ref 39.0–52.0)
Hemoglobin: 13.8 g/dL (ref 13.0–17.0)
MCH: 27.3 pg (ref 26.0–34.0)
MCHC: 32.7 g/dL (ref 30.0–36.0)
MCV: 83.6 fL (ref 80.0–100.0)
Platelets: 209 10*3/uL (ref 150–400)
RBC: 5.05 MIL/uL (ref 4.22–5.81)
RDW: 14.4 % (ref 11.5–15.5)
WBC: 9 10*3/uL (ref 4.0–10.5)
nRBC: 0 % (ref 0.0–0.2)

## 2021-10-09 MED ORDER — ATORVASTATIN CALCIUM 10 MG PO TABS
10.0000 mg | ORAL_TABLET | Freq: Every day | ORAL | 0 refills | Status: DC
  Filled 2021-10-09: qty 30, 30d supply, fill #0

## 2021-10-09 MED ORDER — TAMSULOSIN HCL 0.4 MG PO CAPS
0.4000 mg | ORAL_CAPSULE | Freq: Every day | ORAL | 0 refills | Status: AC
  Filled 2021-10-09: qty 30, 30d supply, fill #0

## 2021-10-09 MED ORDER — ASPIRIN 81 MG PO TBEC
81.0000 mg | DELAYED_RELEASE_TABLET | Freq: Every day | ORAL | 0 refills | Status: AC
  Filled 2021-10-09: qty 30, 30d supply, fill #0

## 2021-10-09 MED ORDER — FUROSEMIDE 40 MG PO TABS
40.0000 mg | ORAL_TABLET | Freq: Every day | ORAL | 0 refills | Status: DC
  Filled 2021-10-09: qty 30, 30d supply, fill #0

## 2021-10-09 MED ORDER — CARVEDILOL 6.25 MG PO TABS
6.2500 mg | ORAL_TABLET | Freq: Two times a day (BID) | ORAL | 0 refills | Status: DC
  Filled 2021-10-09: qty 60, 30d supply, fill #0

## 2021-10-09 NOTE — TOC Transition Note (Signed)
Transition of Care [facility]) - CM/SW Discharge Note   Patient Details  Name: Filbert Craze MRN: [id number] Date of Birth: 16-Nov-1950  [facility] CM/SW Contact:  Zenon Mayo, RN Phone Number: 10/09/2021, 9:47 AM   Clinical Narrative:    Patient is for dc today, this NCM notified Corene Cornea with [facility] of the dc today.  Also patient is not a dc lounge candidate due to language barrier.  TOC to fill meds.      Barriers to Discharge: Continued Medical Work up   Patient Goals and CMS Choice        Discharge Placement                       Discharge Plan and Services   Discharge Planning Services: CM Consult, Follow-up appt scheduled                                 Social Determinants of Health (SDOH) Interventions Food Insecurity Interventions: Intervention Not Indicated   Readmission Risk Interventions     No data to display

## 2021-10-09 NOTE — Discharge Summary (Signed)
Physician Discharge Summary  Daniel Obrien QPY:[id number] DOB: 12-08-1950 DOA: 10/04/2021  PCP: [facility]  Admit date: 10/04/2021 Discharge date: 10/09/2021  Time spent: 35 minutes  Recommendations for Outpatient Follow-up:  PCP in 1 week, 7/27, please check BMP in 1 week CHF TOC clinic on 8/1, reattempt SGLT2i if kidney function improves, titrate diuretics   Discharge Diagnoses:  Principal Problem:   Acute on chronic systolic (congestive) heart failure (HCC) CKD3a   ARF (acute renal failure) (HCC)   Elevated troponin   Hypertension   CKD (chronic kidney disease), stage III ([place]) Urinary retention  Discharge Condition: Stable  Diet recommendation: Low-sodium, heart healthy  Filed Weights   10/08/21 0542 10/09/21 0104 10/09/21 0503  Weight: 81.9 kg 80 kg 81.3 kg    History of present illness:  71/M with history of chronic systolic CHF, diabetes, hypertension, hypothyroidism presented to the ED with shortness of breath, he has been out of his medicines for approximately 1 year, was unable to get transportation to see his cardiologist. -In the ED BP was in the 200s, chest x-ray was suggestive of CHF, troponin 55, 58 -Improved with diuresis, cardiology consulted, echo with EF of 40-45% -Hospital course complicated by worsening AKI    Hospital Course:   Acute on chronic systolic, diastolic CHF -Previously EF of 30% in 2019, never had ischemic eval, echo in 03/2018 improved to 50-55% with grade 2 DD, severe LVH, was then lost to follow-up, reportedly could not get to doctors offices for lack of transportation -Repeat echo with EF of 45%, seen by cardiology, recommended conservative management volume status improved within 48 hours of IV Lasix and SGLT2 however had worsening creatinine to 2.8 from 1.9, hence we held Lasix and SGLT2i -Creatinine improving, down to 2.2 today, now with trace edema, will restart Lasix in 3 days, has TOC follow-up arranged for next  week, needs BMP check at follow-up -Could reattempt SGLT2i based on GFR then   AKI on CKD3a -Baseline creatinine around 1.8-1.9, creatinine up to 2.8, finally improving, now 2.2 -Held Lasix and SGLT2i, suspect hemodynamically mediated, discontinued hydralazine -Voiding without a catheter for the last 2 days, creatinine improving to 2.2 at discharge, please check BMP in 1 week   Urinary retention, -History suggestive of BPH -Required Foley catheter placement in the ED on admission -Started on Flomax, discontinued Foley catheter 2 days ago, patient was able to void without difficulties thereafter    Elevated troponin -55, 58, flat trend, suspect demand ischemia   Hypertensive urgency -BP improving, stopped hydralazine, decrease Coreg dose     PVCs, bigeminy -Replaced K, continue Coreg    Consultations: Cards  Discharge Exam: Vitals:   10/09/21 0503 10/09/21 0813  BP: (!) 124/92 (!) 144/86  Pulse: 69 67  Resp: 18 18  Temp: 97.8 F (36.6 C) 97.7 F (36.5 C)  SpO2: 96% 99%    Gen: Awake, Alert, Oriented X 3,  HEENT: no JVD Lungs: Good air movement bilaterally, CTAB CVS: S1S2/RRR Abd: soft, Non tender, non distended, BS present Extremities: No edema Skin: no new rashes on exposed skin   Discharge Instructions   Discharge Instructions     Diet - low sodium heart healthy   Complete by: As directed    Increase activity slowly   Complete by: As directed       Allergies as of 10/09/2021       Reactions   Heparin Shortness Of Breath, Other (See Comments)   Patient family stated when heparin  is given patient starts shaking, wheezing. [facility] notified.        Medication List     STOP taking these medications    Advil 200 MG tablet Generic drug: ibuprofen   BC HEADACHE POWDER PO   hydrALAZINE 100 MG tablet Commonly known as: APRESOLINE   isosorbide mononitrate 60 MG 24 hr tablet Commonly known as: IMDUR   valsartan 160 MG tablet Commonly  known as: DIOVAN       TAKE these medications    Aspirin Low Dose 81 MG tablet Generic drug: aspirin EC Take 1 tablet (81 mg total) by mouth daily. Swallow whole. Start taking on: October 10, 2021   atorvastatin 10 MG tablet Commonly known as: LIPITOR Take 1 tablet (10 mg total) by mouth daily.   carvedilol 6.25 MG tablet Commonly known as: COREG Take 1 tablet (6.25 mg total) by mouth 2 (two) times daily with a meal. Please make yearly appt with Dr. Meda Coffee for January before anymore refills. 1st attempt What changed:  medication strength how much to take   furosemide 40 MG tablet Commonly known as: Lasix Take 1 tablet (40 mg total) by mouth daily. Start 1 tab daily from Friday 7/28 Start taking on: October 12, 2021   tamsulosin 0.4 MG Caps capsule Commonly known as: FLOMAX Take 1 capsule (0.4 mg total) by mouth daily. Start taking on: October 10, 2021       Allergies  Allergen Reactions   Heparin Shortness Of Breath and Other (See Comments)    Patient family stated when heparin is given patient starts shaking, wheezing. [facility] notified.    Follow-up Information     [facility] Follow up.   Specialty: Family Medicine Why: appt scheduled for 10/11/2021 at 1115 am. please call to reschedule if you cannot make appt. arrive 15 minuts prior to appt. Contact information: [place] [address] [phone]         [facility] Follow up.   Why: Home Health RN -agency will call to arrange appt Contact information: [phone]                 The results of significant diagnostics from this hospitalization (including imaging, microbiology, ancillary and laboratory) are listed below for reference.    Significant Diagnostic Studies: ECHOCARDIOGRAM COMPLETE  Result Date: 10/05/2021    ECHOCARDIOGRAM REPORT   Patient Name:   Daniel Obrien  Date of Exam: 10/05/2021 Medical Rec #:  [id number]  Height:       67.0  in Accession #:    [id number] Weight:       176.1 lb Date of Birth:  [birth year]  BSA:          1.916 m Patient Age:    71 years   BP:           132/81 mmHg Patient Gender: M          HR:           75 bpm. Exam Location:  Inpatient Procedure: 2D Echo Indications:    acute systolic chf  History:        Patient has prior history of Echocardiogram examinations, most                 recent 03/23/2018. Cardiomyopathy; Risk Factors:Diabetes and                 Hypertension.  Sonographer:    [place]  Referring Phys: [place]  1. Global hypokinesis worse in the inferior base. Left ventricular ejection fraction, by estimation, is 45 to 50%. The left ventricle has mildly decreased function. The left ventricle has no regional wall motion abnormalities. The left ventricular internal cavity size was mildly dilated. There is mild left ventricular hypertrophy. Left ventricular diastolic parameters were normal.  2. Right ventricular systolic function is normal. The right ventricular size is normal.  3. Left atrial size was moderately dilated.  4. The mitral valve is normal in structure. No evidence of mitral valve regurgitation. No evidence of mitral stenosis.  5. The aortic valve is tricuspid. There is mild calcification of the aortic valve. There is mild thickening of the aortic valve. Aortic valve regurgitation is mild. Aortic valve sclerosis is present, with no evidence of aortic valve stenosis.  6. The inferior vena cava is normal in size with greater than 50% respiratory variability, suggesting right atrial pressure of 3 mmHg. FINDINGS  Left Ventricle: Global hypokinesis worse in the inferior base. Left ventricular ejection fraction, by estimation, is 45 to 50%. The left ventricle has mildly decreased function. The left ventricle has no regional wall motion abnormalities. The left ventricular internal cavity size was mildly dilated. There is mild left ventricular hypertrophy. Left ventricular  diastolic parameters were normal. Right Ventricle: The right ventricular size is normal. No increase in right ventricular wall thickness. Right ventricular systolic function is normal. Left Atrium: Left atrial size was moderately dilated. Right Atrium: Right atrial size was normal in size. Pericardium: There is no evidence of pericardial effusion. Mitral Valve: The mitral valve is normal in structure. No evidence of mitral valve regurgitation. No evidence of mitral valve stenosis. Tricuspid Valve: The tricuspid valve is normal in structure. Tricuspid valve regurgitation is mild . No evidence of tricuspid stenosis. Aortic Valve: The aortic valve is tricuspid. There is mild calcification of the aortic valve. There is mild thickening of the aortic valve. Aortic valve regurgitation is mild. Aortic valve sclerosis is present, with no evidence of aortic valve stenosis. Pulmonic Valve: The pulmonic valve was normal in structure. Pulmonic valve regurgitation is not visualized. No evidence of pulmonic stenosis. Aorta: The aortic root is normal in size and structure. Venous: The inferior vena cava is normal in size with greater than 50% respiratory variability, suggesting right atrial pressure of 3 mmHg. IAS/Shunts: No atrial level shunt detected by color flow Doppler.  LEFT VENTRICLE PLAX 2D LVIDd:         6.00 cm      Diastology LVIDs:         4.50 cm      LV e' medial:    4.57 cm/s LV PW:         1.10 cm      LV E/e' medial:  12.7 LV IVS:        1.20 cm      LV e' lateral:   10.40 cm/s                             LV E/e' lateral: 5.6  LV Volumes (MOD) LV vol d, MOD A2C: 143.0 ml LV vol d, MOD A4C: 144.0 ml LV vol s, MOD A2C: 81.6 ml LV vol s, MOD A4C: 78.1 ml LV SV MOD A2C:     61.4 ml LV SV MOD A4C:     144.0 ml LV SV MOD BP:      65.0  ml RIGHT VENTRICLE             IVC RV S prime:     11.40 cm/s  IVC diam: 2.10 cm TAPSE (M-mode): 2.0 cm LEFT ATRIUM             Index        RIGHT ATRIUM           Index LA diam:         4.90 cm 2.56 cm/m   RA Area:     17.60 cm LA Vol (A2C):   90.9 ml 47.44 ml/m  RA Volume:   44.80 ml  23.38 ml/m LA Vol (A4C):   79.8 ml 41.65 ml/m LA Biplane Vol: 86.6 ml 45.20 ml/m  AORTIC VALVE LVOT Vmax:   86.50 cm/s LVOT Vmean:  57.100 cm/s LVOT VTI:    0.165 m  AORTA Ao Asc diam: 3.70 cm MITRAL VALVE MV Area (PHT): 3.37 cm    SHUNTS MV Decel Time: 225 msec    Systemic VTI: 0.16 m MV E velocity: 58.20 cm/s MV A velocity: 58.70 cm/s MV E/A ratio:  0.99 Jenkins Rouge MD Electronically signed by Jenkins Rouge MD Signature Date/Time: 10/05/2021/3:28:45 PM    Final    DG Chest Portable 1 View  Result Date: 10/04/2021 CLINICAL DATA:  Shortness of breath, chest tightness. EXAM: PORTABLE CHEST 1 VIEW COMPARISON:  04/26/2019. FINDINGS: The heart is enlarged the mediastinal contour is stable. The pulmonary vasculature is mildly distended. Perihilar interstitial prominence is noted bilaterally. No consolidation, effusion, or pneumothorax. No acute osseous abnormality. IMPRESSION: 1. Cardiomegaly with pulmonary vascular congestion. 2. Perihilar interstitial thickening bilaterally, possible edema or infiltrate. Electronically Signed   By: Brett Fairy M.D.   On: 10/04/2021 02:31    Microbiology: Recent Results (from the past 240 hour(s))  SARS Coronavirus 2 by RT PCR (hospital order, performed in [facility] hospital lab) *cepheid single result test* Anterior Nasal Swab     Status: None   Collection Time: 10/04/21 10:58 AM   Specimen: Anterior Nasal Swab  Result Value Ref Range Status   SARS Coronavirus 2 by RT PCR NEGATIVE NEGATIVE Final    Comment: (NOTE) SARS-CoV-2 target nucleic acids are NOT DETECTED.  The SARS-CoV-2 RNA is generally detectable in upper and lower respiratory specimens during the acute phase of infection. The lowest concentration of SARS-CoV-2 viral copies this assay can detect is 250 copies / mL. A negative result does not preclude SARS-CoV-2 infection and should not be used as  the sole basis for treatment or other patient management decisions.  A negative result may occur with improper specimen collection / handling, submission of specimen other than nasopharyngeal swab, presence of viral mutation(s) within the areas targeted by this assay, and inadequate number of viral copies (<250 copies / mL). A negative result must be combined with clinical observations, patient history, and epidemiological information.  Fact Sheet for Patients:   https://www.patel.info/  Fact Sheet for Healthcare Providers: https://hall.com/  This test is not yet approved or  cleared by the [place] FDA and has been authorized for detection and/or diagnosis of SARS-CoV-2 by FDA under an Emergency Use Authorization (EUA).  This EUA will remain in effect (meaning this test can be used) for the duration of the COVID-19 declaration under Section 564(b)(1) of the Act, 21 U.S.C. section 360bbb-3(b)(1), unless the authorization is terminated or revoked sooner.  Performed at [facility], [place] [address] [place],  [address]  Labs: Basic Metabolic Panel: Recent Labs  Lab 10/04/21 1702 10/05/21 0330 10/06/21 0301 10/07/21 0450 10/08/21 0611 10/09/21 0149  NA 138 138 136 137 138 135  K 3.4* 3.3* 3.5 3.7 4.0 4.4  CL 101 99 98 102 103 102  CO2 '25 28 25 25 26 24  '$ GLUCOSE 139* 158* 122* 113* 127* 112*  BUN 22 26* 41* 55* 57* 58*  CREATININE 1.80* 1.97* 2.82* 2.71* 2.55* 2.26*  CALCIUM 9.1 8.9 8.5* 8.4* 8.5* 8.5*  MG 2.0  --   --   --   --   --    Liver Function Tests: No results for input(s): "AST", "ALT", "ALKPHOS", "BILITOT", "PROT", "ALBUMIN" in the last 168 hours. No results for input(s): "LIPASE", "AMYLASE" in the last 168 hours. No results for input(s): "AMMONIA" in the last 168 hours. CBC: Recent Labs  Lab 10/04/21 0215 10/05/21 0330 10/06/21 0301 10/07/21 0450 10/08/21 0611 10/09/21 0149  WBC 8.5  12.7* 12.0* 10.6* 9.4 9.0  NEUTROABS 5.3  --   --   --   --   --   HGB 13.3 13.9 14.1 14.1 14.0 13.8  HCT 41.6 43.2 42.6 42.3 42.4 42.2  MCV 86.5 83.6 82.9 82.8 83.1 83.6  PLT 165 171 164 181 195 209   Cardiac Enzymes: No results for input(s): "CKTOTAL", "CKMB", "CKMBINDEX", "TROPONINI" in the last 168 hours. BNP: BNP (last 3 results) Recent Labs    10/04/21 0215  BNP 1,637.7*    ProBNP (last 3 results) No results for input(s): "PROBNP" in the last 8760 hours.  CBG: No results for input(s): "GLUCAP" in the last 168 hours.     Signed:  Domenic Polite MD.  [facility] 10/09/2021, 1:30 PM

## 2021-10-09 NOTE — Plan of Care (Signed)
  Problem: Education: Goal: Ability to demonstrate management of disease process will improve Outcome: Progressing   Problem: Education: Goal: Ability to verbalize understanding of medication therapies will improve Outcome: Progressing   Problem: Education: Goal: Individualized Educational Video(s) Outcome: Progressing   Problem: Activity: Goal: Capacity to carry out activities will improve Outcome: Progressing   Problem: Cardiac: Goal: Ability to achieve and maintain adequate cardiopulmonary perfusion will improve Outcome: Progressing

## 2021-10-09 NOTE — TOC CM/SW Note (Addendum)
HF TOC CM contacted [facility] to make aware pt is scheduled for DC home today. [place] orders in Epic. Wife will provide transportation home. Meds are to come up from [place]. Updated Unit RN that pt is not appropriate for dc lounge due to language barrier. [place], Heart Failure TOC CM [phone]

## 2021-10-09 NOTE — Care Management Important Message (Signed)
Important Message  Patient Details  Name: Daniel Obrien MRN: [id number] Date of Birth: 05/15/1950   Medicare Important Message Given:  Yes     Shelda Altes 10/09/2021, 11:32 AM

## 2021-10-09 NOTE — Progress Notes (Addendum)
Heart Failure Stewardship Pharmacist Progress Note   PCP: Pa, [facility] PCP-Cardiologist: None    HPI:  71 yo M with PMH of CHF, T2DM, and HTN.   First established care with cardiology in 2019.  Patient was admitted to the hospital in 12/2017 for evaluation of hypertensive urgency and chest pain. ECHO on 01/14/2018 that showed EF 30%. Patient was fluid overloaded, attempted diuresis with Lasix however creatinine increased from 1.4 to 2.1.   Repeat echocardiogram on 03/23/2018 showed that EF improved to 50-55%.   He presented to the ED on 7/20 with shortness of breath, orthopnea, and PND. He was not taking any meds PTA. CXR with cardiomegaly and pulmonary vascular congestion. ECHO 10/05/2021 with EF 45-50%, mild LVH. Management has been limited by AKI on CKD (SCr peak 2.82).   Discharge HF Medications: Diuretic: furosemide 40 mg daily Beta Blocker: carvedilol 6.25 mg BID  Prior to admission HF Medications: None  Pertinent Lab Values: Serum creatinine 2.26, BUN 58, Potassium 4.4, Sodium 135, BNP 1637.7, Magnesium 2.0, A1c 6.4   Vital Signs: Weight: 179 lbs (admission weight: 176 lbs) Blood pressure: 120/70s  Heart rate: 60-70s  I/O: -1.4L yesterday; net -5.8L  Medication Assistance / Insurance Benefits Check: Does the patient have prescription insurance?  No  Does the patient qualify for medication assistance through manufacturers or grants?   Pending Eligible grants and/or patient assistance programs: pending Medication assistance applications in progress: none  Medication assistance applications approved: none Approved medication assistance renewals will be completed by: pending  Outpatient Pharmacy:  Prior to admission outpatient pharmacy: [facility] Is the patient willing to use [facility] at discharge? Yes Is the patient willing to transition their outpatient pharmacy to utilize a [facility] outpatient pharmacy?   Pending    Assessment: 1.  Acute on chronic systolic and diastolic CHF (LVEF 92-42%). NYHA class III symptoms. - No edema or JVD on exam. Furosemide 40 mg daily at discharge. - Continue carvedilol 6.25 mg BID - No ACE/ARB/ARNI, MRA, or SGLT2i with AKI on CKD. Also has urinary retention with foley removed yesterday.    Plan: 1) Medication changes recommended at this time: - Continue current regimen pending further improvement in renal function.  2) Patient assistance: - Uninsured - will complete assistance applications as indicated  Kerby Nora, PharmD, BCPS Heart Failure Cytogeneticist Phone [phone]

## 2021-10-10 DIAGNOSIS — E1122 Type 2 diabetes mellitus with diabetic chronic kidney disease: Secondary | ICD-10-CM | POA: Diagnosis not present

## 2021-10-10 DIAGNOSIS — I080 Rheumatic disorders of both mitral and aortic valves: Secondary | ICD-10-CM | POA: Diagnosis not present

## 2021-10-10 DIAGNOSIS — Z91198 Patient's noncompliance with other medical treatment and regimen for other reason: Secondary | ICD-10-CM | POA: Diagnosis not present

## 2021-10-10 DIAGNOSIS — N1831 Chronic kidney disease, stage 3a: Secondary | ICD-10-CM | POA: Diagnosis not present

## 2021-10-10 DIAGNOSIS — R338 Other retention of urine: Secondary | ICD-10-CM | POA: Diagnosis not present

## 2021-10-10 DIAGNOSIS — N401 Enlarged prostate with lower urinary tract symptoms: Secondary | ICD-10-CM | POA: Diagnosis not present

## 2021-10-10 DIAGNOSIS — Z9181 History of falling: Secondary | ICD-10-CM | POA: Diagnosis not present

## 2021-10-10 DIAGNOSIS — N134 Hydroureter: Secondary | ICD-10-CM | POA: Diagnosis not present

## 2021-10-10 DIAGNOSIS — E785 Hyperlipidemia, unspecified: Secondary | ICD-10-CM | POA: Diagnosis not present

## 2021-10-10 DIAGNOSIS — I130 Hypertensive heart and chronic kidney disease with heart failure and stage 1 through stage 4 chronic kidney disease, or unspecified chronic kidney disease: Secondary | ICD-10-CM | POA: Diagnosis not present

## 2021-10-10 DIAGNOSIS — I5032 Chronic diastolic (congestive) heart failure: Secondary | ICD-10-CM | POA: Diagnosis not present

## 2021-10-10 DIAGNOSIS — E039 Hypothyroidism, unspecified: Secondary | ICD-10-CM | POA: Diagnosis not present

## 2021-10-10 DIAGNOSIS — Z7982 Long term (current) use of aspirin: Secondary | ICD-10-CM | POA: Diagnosis not present

## 2021-10-10 DIAGNOSIS — I5023 Acute on chronic systolic (congestive) heart failure: Secondary | ICD-10-CM | POA: Diagnosis not present

## 2021-10-10 DIAGNOSIS — I429 Cardiomyopathy, unspecified: Secondary | ICD-10-CM | POA: Diagnosis not present

## 2021-10-11 DIAGNOSIS — N401 Enlarged prostate with lower urinary tract symptoms: Secondary | ICD-10-CM | POA: Diagnosis not present

## 2021-10-11 DIAGNOSIS — N138 Other obstructive and reflux uropathy: Secondary | ICD-10-CM | POA: Diagnosis not present

## 2021-10-11 DIAGNOSIS — N1831 Chronic kidney disease, stage 3a: Secondary | ICD-10-CM | POA: Diagnosis not present

## 2021-10-11 DIAGNOSIS — Z79899 Other long term (current) drug therapy: Secondary | ICD-10-CM | POA: Diagnosis not present

## 2021-10-11 DIAGNOSIS — I5022 Chronic systolic (congestive) heart failure: Secondary | ICD-10-CM | POA: Diagnosis not present

## 2021-10-15 NOTE — Progress Notes (Signed)
HEART & VASCULAR TRANSITION OF CARE CONSULT NOTE     Referring Physician: Dr. Broadus John Primary Care:Pa, [facility] HF Cardiologist: Assign to Dr. Aundra Dubin  HPI: Referred to clinic by Dr. Broadus John for heart failure consultation.   Daniel Obrien is a 71 y.o. male from [place] (speaks [place]) with history of chronic systolic CHF, diabetes, hypertension, and hypothyroidism.   Admitted 12/2017 with chest pain and hypertensive emergency, systolic BP 161. He required Cardene gtt. Echo showed EF of 30 to 35%.  Troponin flat. Cardiology was consulted and he was diuresed with IV lasix. Drips weaned off. He was found to have mild to moderate left hydroureteronephrosis for which urology was consulted and recommended outpatient follow-up.  Hospitalization c/b by AKI and diuretics held. He was discharged with cardiology and urology follow up.  Cardiology follow up 11/19, carvedilol increased and planned to repeat echo in 2 months. If EF had not improved on medical therapy, the plan was to pursue ischemic work up involving cardiac catheterization. He was also referred to HTN clinic.  Echo 1/20 showed EF of 50 to 55%, with no wall motion abnormality and a grade 2 DD.   Last seen by Dr. Meda Coffee with cardiology 4/21, at which time he was stable on Diovan 160 mg, Coreg 12.5 mg bid, hydralazine 100 mg tid, Imdur 60 mg daily and Lasix 20 mg daily.  Lost to follow up.  Admitted 7/23 with a/c CHF after being out of his cardiac meds x 1 year, as he did not have transportation to cardiology appointment. Echo showed EF 40-45%. Diuresed with IV lasix. Hospitalization c/b AKI, GDMT limited by CKD. Placed on Coreg 6.25 bid and Lasix 40 daily. He was discharged home, weight 179 lbs.  Cardiac Testing   - Echo (7/23): EF 40-45%  - Echo (1/20): EF 50-55%  - Echo (10/19): EF 30-35%   Review of Systems: [y] = yes, '[ ]'$  = no   General: Weight gain '[ ]'$ ; Weight loss '[ ]'$ ; Anorexia '[ ]'$ ; Fatigue  '[ ]'$ ; Fever '[ ]'$ ; Chills '[ ]'$ ; Weakness '[ ]'$   Cardiac: Chest pain/pressure '[ ]'$ ; Resting SOB '[ ]'$ ; Exertional SOB '[ ]'$ ; Orthopnea '[ ]'$ ; Pedal Edema '[ ]'$ ; Palpitations '[ ]'$ ; Syncope '[ ]'$ ; Presyncope '[ ]'$ ; Paroxysmal nocturnal dyspnea'[ ]'$   Pulmonary: Cough '[ ]'$ ; Wheezing'[ ]'$ ; Hemoptysis'[ ]'$ ; Sputum '[ ]'$ ; Snoring '[ ]'$   GI: Vomiting'[ ]'$ ; Dysphagia'[ ]'$ ; Melena'[ ]'$ ; Hematochezia '[ ]'$ ; Heartburn'[ ]'$ ; Abdominal pain '[ ]'$ ; Constipation '[ ]'$ ; Diarrhea '[ ]'$ ; BRBPR '[ ]'$   GU: Hematuria'[ ]'$ ; Dysuria '[ ]'$ ; Nocturia'[ ]'$   Vascular: Pain in legs with walking '[ ]'$ ; Pain in feet with lying flat '[ ]'$ ; Non-healing sores '[ ]'$ ; Stroke '[ ]'$ ; TIA '[ ]'$ ; Slurred speech '[ ]'$ ;  Neuro: Headaches'[ ]'$ ; Vertigo'[ ]'$ ; Seizures'[ ]'$ ; Paresthesias'[ ]'$ ;Blurred vision '[ ]'$ ; Diplopia '[ ]'$ ; Vision changes '[ ]'$   Ortho/Skin: Arthritis '[ ]'$ ; Joint pain '[ ]'$ ; Muscle pain '[ ]'$ ; Joint swelling '[ ]'$ ; Back Pain '[ ]'$ ; Rash '[ ]'$   Psych: Depression'[ ]'$ ; Anxiety'[ ]'$   Heme: Bleeding problems '[ ]'$ ; Clotting disorders '[ ]'$ ; Anemia '[ ]'$   Endocrine: Diabetes Blue.Reese ]; Thyroid dysfunction[y]   Past Medical History:  Diagnosis Date   Acute CHF (congestive heart failure) ([place]) 12/17/2017   ARF (acute renal failure) ([place]) 12/17/2017   Cardiomyopathy ([place]), new, EF 30-35% 12/18/2017   Chest pain 12/17/2017   Demand ischemia ([place])    Diabetes ([place])    Elevated troponin 12/18/2017  Hydroureteronephrosis, left, mild to moderate 12/18/2017   Hypertension    Hyperthyroidism 12/18/2017   Penile pain 12/18/2017   Current Outpatient Medications  Medication Sig Dispense Refill   aspirin EC 81 MG tablet Take 1 tablet (81 mg total) by mouth daily. Swallow whole. 30 tablet 0   atorvastatin (LIPITOR) 10 MG tablet Take 1 tablet (10 mg total) by mouth daily. 30 tablet 0   carvedilol (COREG) 6.25 MG tablet Take 1 tablet (6.25 mg total) by mouth 2 (two) times daily with a meal. Please make yearly appt with Dr. Meda Coffee for January before anymore refills. 1st attempt 60 tablet 0   furosemide (LASIX) 40 MG tablet Take 1 tablet (40  mg total) by mouth daily. Start 1 tab daily from Friday 7/28 30 tablet 0   tamsulosin (FLOMAX) 0.4 MG CAPS capsule Take 1 capsule (0.4 mg total) by mouth daily. 30 capsule 0   No current facility-administered medications for this visit.   Allergies  Allergen Reactions   Heparin Shortness Of Breath and Other (See Comments)    Patient family stated when heparin is given patient starts shaking, wheezing. [facility] notified.      Social History   Socioeconomic History   Marital status: Married    Spouse name: Not on file   Number of children: Not on file   Years of education: Not on file   Highest education level: Not on file  Occupational History   Occupation: Retired  Tobacco Use   Smoking status: Never   Smokeless tobacco: Never  Vaping Use   Vaping Use: Never used  Substance and Sexual Activity   Alcohol use: Never   Drug use: Never   Sexual activity: Not on file  Other Topics Concern   Not on file  Social History Narrative   Pt and wife are [place], [place]. Wife speaks a little [place] and [place], a little [place], mostly [place].   Husband speaks more [place] and [place].   Social Determinants of Health   Financial Resource Strain: Not on file  Food Insecurity: No Food Insecurity (10/05/2021)   Hunger Vital Sign    Worried About Running Out of Food in the Last Year: Never true    Ran Out of Food in the Last Year: Never true  Transportation Needs: Not on file  Physical Activity: Not on file  Stress: Not on file  Social Connections: Not on file  Intimate Partner Violence: Not on file   Family History  Problem Relation Age of Onset   Unexplained death Mother 64   Unexplained death Father 8   Hypertension Neg Hx    Heart disease Neg Hx     There were no vitals filed for this visit.  PHYSICAL EXAM: General:  Well appearing. No respiratory difficulty HEENT: normal Neck: supple. no JVD. Carotids 2+ bilat; no bruits. No lymphadenopathy or thryomegaly  appreciated. Cor: PMI nondisplaced. Regular rate & rhythm. No rubs, gallops or murmurs. Lungs: clear Abdomen: soft, nontender, nondistended. No hepatosplenomegaly. No bruits or masses. Good bowel sounds. Extremities: no cyanosis, clubbing, rash, edema Neuro: alert & oriented x 3, cranial nerves grossly intact. moves all 4 extremities w/o difficulty. Affect pleasant.  ECG:   ASSESSMENT & PLAN: 1.) HFmEF - GDMT limited by CKD - Continue Coreg 6.25 mg bid - Continue Lasix 40 mg daily  2.) CKD IIIa - Check BMET today. - Avoid hypotension. - Refer to Nephrology  3.) PVCs - Check BMET and Mag - Consider Zio to quantify burden -  Sleep study  4.) HTN - Controlled. - GDMT as above - Sleep study  NYHA *** GDMT  Diuretic- BB- Ace/ARB/ARNI MRA SGLT2i    Referred to HFSW (PCP, Medications, Transportation, ETOH Abuse, Drug Abuse, Insurance, Financial ): Yes or No Refer to Pharmacy: Yes or No Refer to Home Health: Yes on No Refer to Advanced Heart Failure Clinic: Yes or no  Refer to General Cardiology: Yes or No  Follow up  With Pharmacy in 3 weeks, APP in 6 weeks and 12 weeks with Dr. Aundra Dubin + echo. If EF has normalized, can likely graduate from AHF clinic and followed by [facility] Cardiology.  Allena Katz, FNP-BC 10/15/21

## 2021-10-16 ENCOUNTER — Encounter (HOSPITAL_COMMUNITY): Payer: Self-pay

## 2021-10-16 ENCOUNTER — Ambulatory Visit (HOSPITAL_COMMUNITY)
Admit: 2021-10-16 | Discharge: 2021-10-16 | Disposition: A | Discharge status: Home / Self Care | Payer: Medicare Other | Source: Ambulatory Visit | Attending: Physician Assistant | Admitting: Physician Assistant

## 2021-10-16 VITALS — BP 115/80 | HR 64 | Wt 177.4 lb

## 2021-10-16 DIAGNOSIS — I10 Essential (primary) hypertension: Secondary | ICD-10-CM

## 2021-10-16 DIAGNOSIS — R5383 Other fatigue: Secondary | ICD-10-CM | POA: Diagnosis not present

## 2021-10-16 DIAGNOSIS — Z79899 Other long term (current) drug therapy: Secondary | ICD-10-CM | POA: Diagnosis not present

## 2021-10-16 DIAGNOSIS — N1831 Chronic kidney disease, stage 3a: Secondary | ICD-10-CM | POA: Diagnosis not present

## 2021-10-16 DIAGNOSIS — E039 Hypothyroidism, unspecified: Secondary | ICD-10-CM | POA: Diagnosis not present

## 2021-10-16 DIAGNOSIS — E1122 Type 2 diabetes mellitus with diabetic chronic kidney disease: Secondary | ICD-10-CM | POA: Insufficient documentation

## 2021-10-16 DIAGNOSIS — I130 Hypertensive heart and chronic kidney disease with heart failure and stage 1 through stage 4 chronic kidney disease, or unspecified chronic kidney disease: Secondary | ICD-10-CM | POA: Insufficient documentation

## 2021-10-16 DIAGNOSIS — Z7984 Long term (current) use of oral hypoglycemic drugs: Secondary | ICD-10-CM | POA: Insufficient documentation

## 2021-10-16 DIAGNOSIS — I5023 Acute on chronic systolic (congestive) heart failure: Secondary | ICD-10-CM | POA: Diagnosis not present

## 2021-10-16 DIAGNOSIS — I5032 Chronic diastolic (congestive) heart failure: Secondary | ICD-10-CM | POA: Diagnosis not present

## 2021-10-16 DIAGNOSIS — I5042 Chronic combined systolic (congestive) and diastolic (congestive) heart failure: Secondary | ICD-10-CM | POA: Diagnosis not present

## 2021-10-16 DIAGNOSIS — I493 Ventricular premature depolarization: Secondary | ICD-10-CM | POA: Insufficient documentation

## 2021-10-16 DIAGNOSIS — N1832 Chronic kidney disease, stage 3b: Secondary | ICD-10-CM | POA: Insufficient documentation

## 2021-10-16 DIAGNOSIS — R7303 Prediabetes: Secondary | ICD-10-CM | POA: Diagnosis not present

## 2021-10-16 DIAGNOSIS — Z7901 Long term (current) use of anticoagulants: Secondary | ICD-10-CM | POA: Diagnosis not present

## 2021-10-16 LAB — BRAIN NATRIURETIC PEPTIDE: B Natriuretic Peptide: 93.2 pg/mL (ref 0.0–100.0)

## 2021-10-16 LAB — BASIC METABOLIC PANEL
Anion gap: 8 (ref 5–15)
BUN: 49 mg/dL — ABNORMAL HIGH (ref 8–23)
CO2: 27 mmol/L (ref 22–32)
Calcium: 9.3 mg/dL (ref 8.9–10.3)
Chloride: 103 mmol/L (ref 98–111)
Creatinine, Ser: 2.58 mg/dL — ABNORMAL HIGH (ref 0.61–1.24)
GFR, Estimated: 26 mL/min — ABNORMAL LOW (ref >60)
Glucose, Bld: 141 mg/dL — ABNORMAL HIGH (ref 70–99)
Potassium: 4 mmol/L (ref 3.5–5.1)
Sodium: 138 mmol/L (ref 135–145)

## 2021-10-16 LAB — MAGNESIUM: Magnesium: 2.3 mg/dL (ref 1.7–2.4)

## 2021-10-16 LAB — TSH: TSH: 1.148 u[IU]/mL (ref 0.350–4.500)

## 2021-10-16 MED ORDER — DAPAGLIFLOZIN PROPANEDIOL 10 MG PO TABS: 10.0000 mg | ORAL_TABLET | Freq: Every day | ORAL | 11 refills | Status: AC

## 2021-10-16 MED ORDER — FUROSEMIDE 40 MG PO TABS: 40.0000 mg | ORAL_TABLET | ORAL | 0 refills | Status: AC | PRN

## 2021-10-16 NOTE — Patient Instructions (Signed)
Start Farxiga '10mg'$  daily.  Changes Lasix to '40mg'$  as needed for welling or shortness of breath.  Labs done today, your results will be available in MyChart, we will contact you for abnormal readings.   You have been referred to nephology. They will call you to arrange the appointment.  Thank you for allowing [place] to provider your heart failure care after your recent hospitalization. Please follow-up with Dr. Harrington Challenger as scheduled.  If you have any questions, issues, or concerns before your next appointment please call our office at [phone], opt. 2 and leave a message for the triage nurse.

## 2021-10-17 ENCOUNTER — Telehealth (HOSPITAL_COMMUNITY): Payer: Self-pay

## 2021-10-17 NOTE — Telephone Encounter (Signed)
Referral faxed to [facility] on 10/17/2021 at [place].

## 2021-10-18 ENCOUNTER — Telehealth: Payer: Self-pay | Admitting: *Deleted

## 2021-10-18 NOTE — Chronic Care Management (AMB) (Signed)
  Care Coordination  Outreach Note  10/18/2021 Name: Sylvan Sookdeo MRN: [id number] DOB: 09-02-1950   Care Coordination Outreach Attempts  An unsuccessful telephone outreach was attempted today to offer the patient information about available care coordination services as a benefit of their health plan. Referral placed   Follow Up Plan:  Additional outreach attempts will be made to offer the patient care coordination information and services.   Encounter Outcome:  No Answer  Julian Hy, [place] Direct Dial: [phone]

## 2021-10-24 NOTE — Chronic Care Management (AMB) (Signed)
  Care Coordination  Outreach Note  10/24/2021 Name: Draco Malczewski MRN: [id number] DOB: [birth year]   Care Coordination Outreach Attempts  A second unsuccessful outreach was attempted today to offer the patient with information about available care coordination services as a benefit of their health plan.     Received referral   Follow Up Plan:  Additional outreach attempts will be made to offer the patient care coordination information and services.   Encounter Outcome:  No Answer  Julian Hy, [place] Direct Dial: [phone]

## 2021-10-29 NOTE — Chronic Care Management (AMB) (Signed)
  Care Coordination  Outreach Note  10/29/2021 Name: Justin Meisenheimer MRN: [id number] DOB: 05-24-1950   Care Coordination Outreach Attempts  A third unsuccessful outreach was attempted today to offer the patient with information about available care coordination services as a benefit of their health plan.   Follow Up Plan:  No further outreach attempts will be made at this time. We have been unable to contact the patient to offer or enroll patient in care coordination services  Encounter Outcome:  No Answer  Julian Hy, [place] Direct Dial: [phone]

## 2021-11-09 DIAGNOSIS — E039 Hypothyroidism, unspecified: Secondary | ICD-10-CM | POA: Diagnosis not present

## 2021-11-09 DIAGNOSIS — Z9181 History of falling: Secondary | ICD-10-CM | POA: Diagnosis not present

## 2021-11-09 DIAGNOSIS — I5032 Chronic diastolic (congestive) heart failure: Secondary | ICD-10-CM | POA: Diagnosis not present

## 2021-11-09 DIAGNOSIS — I130 Hypertensive heart and chronic kidney disease with heart failure and stage 1 through stage 4 chronic kidney disease, or unspecified chronic kidney disease: Secondary | ICD-10-CM | POA: Diagnosis not present

## 2021-11-09 DIAGNOSIS — I5023 Acute on chronic systolic (congestive) heart failure: Secondary | ICD-10-CM | POA: Diagnosis not present

## 2021-11-09 DIAGNOSIS — I429 Cardiomyopathy, unspecified: Secondary | ICD-10-CM | POA: Diagnosis not present

## 2021-11-09 DIAGNOSIS — N1831 Chronic kidney disease, stage 3a: Secondary | ICD-10-CM | POA: Diagnosis not present

## 2021-11-09 DIAGNOSIS — Z7982 Long term (current) use of aspirin: Secondary | ICD-10-CM | POA: Diagnosis not present

## 2021-11-09 DIAGNOSIS — N401 Enlarged prostate with lower urinary tract symptoms: Secondary | ICD-10-CM | POA: Diagnosis not present

## 2021-11-09 DIAGNOSIS — R338 Other retention of urine: Secondary | ICD-10-CM | POA: Diagnosis not present

## 2021-11-09 DIAGNOSIS — E785 Hyperlipidemia, unspecified: Secondary | ICD-10-CM | POA: Diagnosis not present

## 2021-11-09 DIAGNOSIS — N134 Hydroureter: Secondary | ICD-10-CM | POA: Diagnosis not present

## 2021-11-09 DIAGNOSIS — Z91198 Patient's noncompliance with other medical treatment and regimen for other reason: Secondary | ICD-10-CM | POA: Diagnosis not present

## 2021-11-09 DIAGNOSIS — E1122 Type 2 diabetes mellitus with diabetic chronic kidney disease: Secondary | ICD-10-CM | POA: Diagnosis not present

## 2021-11-09 DIAGNOSIS — I080 Rheumatic disorders of both mitral and aortic valves: Secondary | ICD-10-CM | POA: Diagnosis not present

## 2021-11-13 NOTE — Progress Notes (Unsigned)
Office Visit    Patient Name: Daniel Obrien Date of Encounter: 11/15/2021  Primary Care Provider:  Pa, [place] Primary Cardiologist:  None Primary Electrophysiologist: None  Chief Complaint    Daniel Obrien is a 71 y.o. male with PMH of DM, HTN chronic combined CHF, DM type II, HTN, Dilated cardiomyopathy who presents for post hospital visit for shortness of breath.  Past Medical History    Past Medical History:  Diagnosis Date   Acute CHF (congestive heart failure) ([place]) 12/17/2017   ARF (acute renal failure) ([place]) 12/17/2017   Cardiomyopathy ([place]), new, EF 30-35% 12/18/2017   Chest pain 12/17/2017   Demand ischemia ([place])    Diabetes ([place])    Elevated troponin 12/18/2017   Hydroureteronephrosis, left, mild to moderate 12/18/2017   Hypertension    Hyperthyroidism 12/18/2017   Penile pain 12/18/2017   Past Surgical History:  Procedure Laterality Date   NOSE SURGERY      Allergies  Allergies  Allergen Reactions   Heparin Shortness Of Breath and Other (See Comments)    Patient family stated when heparin is given patient starts shaking, wheezing. [facility] notified.    History of Present Illness    Daniel Obrien is a 71 year old male with above-mentioned past medical history who presents today for posthospital follow-up of CHF.  Mr. Daniel Obrien was initially seen by Dr. Meda Coffee in 2019 for evaluation of chest pain.  Patient's initial blood pressure was 221/130 he was currently not on any prescription medications 2D echo was completed with EF of 30% with diffuse hypokinesis, with severely dilated and increased wall thickness, mild AV regurgitation with mild to moderate MV regurgitation and severely dilated LA with mildly dilated RV.  GDMT was optimized carefully.  Patient developed acute kidney injury with peak creatinine of 2.64.  Renal ultrasound was performed that was negative for hydronephrosis.  Patient was seen in follow-up and 2D echo was repeated showing improved  EF of 50% with no RWMA grade II DD, pulmonary arteries: PA peak pressure: 31 mm Hg.  He was seen in clinic on 05/2019 with complaints of dyspnea and elevated blood pressure.  Medications were adjusted and patient encouraged to use low-sodium diet.  Good hygiene previously  Patient was seen 09/2018 with complaint of shortness of breath.  Patient also endorsed some chest tightness and reportedly had been off of his medications for 4 months.  BNP was elevated to 1637 with demand ischemia noted and chest x-ray showed cardiomegaly with pulmonary vascular congestion.  2D echo was completed with reduced EF of 45-50% and global hypokinesis worse in the inferior base only dilated LV hypertrophy, and no evidence of stenosis or aortic valve stenosis.  Patient was diuresed with IV Lasix creatinine had worsened to 2.8.  His Lasix and SGLT2 were both discontinued due to renal function.  He was seen in the heart failure clinic on 10/16/2021.  He was restarted on Farxiga and Lasix was changed to 40 mg as needed.  He was also referred to nephrology  Mr.  Daniel Obrien presents today with his wife for follow-up.  We were also accompanied by an interpreter throughout the visit.  Patient's blood pressure today was elevated 186/104 He reports that he has been taking his medications incorrectly since being home from the hospital.  He has been taking double the amount of medications and not taking blood pressure medications twice a day.  He is euvolemic on examination today.  Patient denies chest pain, palpitations, dyspnea, PND, orthopnea,  nausea, vomiting, dizziness, syncope, edema, weight gain, or early satiety.   Home Medications    Current Outpatient Medications  Medication Sig Dispense Refill   aspirin EC 81 MG tablet Take 1 tablet (81 mg total) by mouth daily. Swallow whole. 30 tablet 0   atorvastatin (LIPITOR) 10 MG tablet Take 1 tablet (10 mg total) by mouth daily. 30 tablet 0   carvedilol (COREG) 12.5 MG tablet Take 1 tablet  (12.5 mg total) by mouth 2 (two) times daily. 180 tablet 3   dapagliflozin propanediol (FARXIGA) 10 MG TABS tablet Take 1 tablet (10 mg total) by mouth daily before breakfast. 30 tablet 11   furosemide (LASIX) 40 MG tablet Take 1 tablet (40 mg total) by mouth as needed. Start 1 tab daily from Friday 7/28 30 tablet 0   tamsulosin (FLOMAX) 0.4 MG CAPS capsule Take 1 capsule (0.4 mg total) by mouth daily. 30 capsule 0   No current facility-administered medications for this visit.     Review of Systems  Please see the history of present illness.     All other systems reviewed and are otherwise negative except as noted above.  Physical Exam    Wt Readings from Last 3 Encounters:  11/15/21 174 lb (78.9 kg)  10/16/21 177 lb 6.4 oz (80.5 kg)  10/09/21 179 lb 3.2 oz (81.3 kg)   VS: Vitals:   11/15/21 1413 11/15/21 1557  BP: (!) 186/104 (!) 146/96  Pulse: 82   SpO2: 96%   ,Body mass index is 27.25 kg/m.  Constitutional:      Appearance: Healthy appearance. Not in distress.  Neck:     Vascular: JVD normal.  Pulmonary:     Effort: Pulmonary effort is normal.     Breath sounds: No wheezing. No rales. Diminished in the bases Cardiovascular:     Normal rate. Regular rhythm. Normal S1. Normal S2.      Murmurs: There is no murmur.  Edema:    Peripheral edema absent.  Abdominal:     Palpations: Abdomen is soft non tender. There is no hepatomegaly.  Skin:    General: Skin is warm and dry.  Neurological:     General: No focal deficit present.     Mental Status: Alert and oriented to person, place and time.     Cranial Nerves: Cranial nerves are intact.  EKG/LABS/Other Studies Reviewed    ECG personally reviewed by me today -none completed   Lab Results  Component Value Date   WBC 9.0 10/09/2021   HGB 13.8 10/09/2021   HCT 42.2 10/09/2021   MCV 83.6 10/09/2021   PLT 209 10/09/2021   Lab Results  Component Value Date   CREATININE 2.58 (H) 10/16/2021   BUN 49 (H) 10/16/2021    NA 138 10/16/2021   K 4.0 10/16/2021   CL 103 10/16/2021   CO2 27 10/16/2021   Lab Results  Component Value Date   ALT 45 (H) 12/24/2017   AST 37 12/24/2017   ALKPHOS 85 12/24/2017   BILITOT 0.6 12/24/2017   Lab Results  Component Value Date   CHOL 206 (H) 10/04/2021   HDL 52 10/04/2021   LDLCALC 141 (H) 10/04/2021   TRIG 67 10/04/2021   CHOLHDL 4.0 10/04/2021    Lab Results  Component Value Date   HGBA1C 6.4 (H) 10/04/2021    Assessment & Plan    1.  Acute on chronic systolic, diastolic CHF: -NYHA class II -Most recent 2D echo completed with EF 45-50% and  mildly decreased LV function -Today patient is euvolemic on examination Patient reports that he was taking twice the amount of all listed medications. -BMET, Mg, CBC today -Low sodium diet, fluid restriction <2L, and daily weights encouraged. Educated to contact our office for weight gain of 2 lbs overnight or 5 lbs in one week.   2.  Hypertensive urgency: -Blood pressure today was elevated at 186/104 -We will increase carvedilol to 12.5 twice daily -Patient was instructed to check blood pressures and report back to office HYPERTENSION CONTROL Vitals:   11/15/21 1413 11/15/21 1557  BP: (!) 186/104 (!) 146/96    The patient's blood pressure is elevated above target today.  In order to address the patient's elevated BP: Blood pressure will be monitored at home to determine if medication changes need to be made.; A current anti-hypertensive medication was adjusted today.      3.  Hyperlipidemia: -Currently monitored by PCP by PCP Continue Lipitor 10 mg  4.  CKD stage III: -Creatinine was 2.58 on 10/16/2021 -Today patient reported that he was taking 80 mg of Lasix daily -We will check BMET as noted above -Patient was referred to nephrology   5.  PVCs: -Patient reports today that he has not felt many extra heartbeats over the last few weeks -14-day ZIO monitor to evaluate PVC burden   Disposition:  Follow-up with None or APP in 2 months    Medication Adjustments/Labs and Tests Ordered: Current medicines are reviewed at length with the patient today.  Concerns regarding medicines are outlined above.   Signed, Mable Fill, Marissa Nestle, NP 11/15/2021, 4:02 PM [place]

## 2021-11-14 DIAGNOSIS — I5032 Chronic diastolic (congestive) heart failure: Secondary | ICD-10-CM | POA: Diagnosis not present

## 2021-11-14 DIAGNOSIS — I5023 Acute on chronic systolic (congestive) heart failure: Secondary | ICD-10-CM | POA: Diagnosis not present

## 2021-11-14 DIAGNOSIS — E039 Hypothyroidism, unspecified: Secondary | ICD-10-CM | POA: Diagnosis not present

## 2021-11-14 DIAGNOSIS — N1831 Chronic kidney disease, stage 3a: Secondary | ICD-10-CM | POA: Diagnosis not present

## 2021-11-14 DIAGNOSIS — E1122 Type 2 diabetes mellitus with diabetic chronic kidney disease: Secondary | ICD-10-CM | POA: Diagnosis not present

## 2021-11-14 DIAGNOSIS — I130 Hypertensive heart and chronic kidney disease with heart failure and stage 1 through stage 4 chronic kidney disease, or unspecified chronic kidney disease: Secondary | ICD-10-CM | POA: Diagnosis not present

## 2021-11-15 ENCOUNTER — Encounter: Payer: Self-pay | Admitting: Nurse Practitioner

## 2021-11-15 ENCOUNTER — Ambulatory Visit (INDEPENDENT_AMBULATORY_CARE_PROVIDER_SITE_OTHER): Payer: Medicare Other

## 2021-11-15 ENCOUNTER — Ambulatory Visit: Payer: Medicare Other | Attending: Nurse Practitioner | Admitting: Nurse Practitioner

## 2021-11-15 VITALS — BP 146/96 | HR 82 | Ht 67.0 in | Wt 174.0 lb

## 2021-11-15 DIAGNOSIS — R002 Palpitations: Secondary | ICD-10-CM | POA: Diagnosis not present

## 2021-11-15 DIAGNOSIS — I493 Ventricular premature depolarization: Secondary | ICD-10-CM | POA: Insufficient documentation

## 2021-11-15 DIAGNOSIS — I5042 Chronic combined systolic (congestive) and diastolic (congestive) heart failure: Secondary | ICD-10-CM | POA: Diagnosis not present

## 2021-11-15 DIAGNOSIS — I10 Essential (primary) hypertension: Secondary | ICD-10-CM | POA: Insufficient documentation

## 2021-11-15 MED ORDER — CARVEDILOL 12.5 MG PO TABS: 12.5000 mg | ORAL_TABLET | Freq: Two times a day (BID) | ORAL | 3 refills | Status: DC

## 2021-11-15 NOTE — Patient Instructions (Signed)
Medication Instructions:  Your physician has recommended you make the following change in your medication:   INCREASE the Carvedilol to 12.5 mg taking 1 tablet twice a day.  THIS REPLACES THE 6.25 MG TABLET.  PICK YOUR NEW ONE UP AT [place]   *If you need a refill on your cardiac medications before your next appointment, please call your pharmacy*   Lab Work: TODAY:  BMET, CBC, & MAG  If you have labs (blood work) drawn today and your tests are completely normal, you will receive your results only by: [place] (if you have MyChart) OR A paper copy in the mail If you have any lab test that is abnormal or we need to change your treatment, we will call you to review the results.   Testing/Procedures: [place]- Long Term Monitor Instructions  Your physician has requested you wear a ZIO patch monitor for 14 days.  This is a single patch monitor. [employer] supplies one patch monitor per enrollment. Additional stickers are not available. Please do not apply patch if you will be having a Nuclear Stress Test,  Echocardiogram, Cardiac CT, MRI, or Chest Xray during the period you would be wearing the  monitor. The patch cannot be worn during these tests. You cannot remove and re-apply the  ZIO XT patch monitor.  Your ZIO patch monitor will be mailed 3 day USPS to your address on file. It may take 3-5 days  to receive your monitor after you have been enrolled.  Once you have received your monitor, please review the enclosed instructions. Your monitor  has already been registered assigning a specific monitor serial # to you.  Billing and Patient Assistance Program Information  We have supplied [employer] with any of your insurance information on file for billing purposes. [employer] offers a sliding scale Patient Assistance Program for patients that do not have  insurance, or whose insurance does not completely cover the cost of the ZIO monitor.  You must apply for the Patient Assistance  Program to qualify for this discounted rate.  To apply, please call [employer] at [phone], select option 4, select option 2, ask to apply for  Patient Assistance Program. Theodore Demark will ask your household income, and how many people  are in your household. They will quote your out-of-pocket cost based on that information.  [employer] will also be able to set up a 50-month interest-free payment plan if needed.  Applying the monitor   Shave hair from upper left chest.  Hold abrader disc by orange tab. Rub abrader in 40 strokes over the upper left chest as  indicated in your monitor instructions.  Clean area with 4 enclosed alcohol pads. Let dry.  Apply patch as indicated in monitor instructions. Patch will be placed under collarbone on left  side of chest with arrow pointing upward.  Rub patch adhesive wings for 2 minutes. Remove white label marked "1". Remove the white  label marked "2". Rub patch adhesive wings for 2 additional minutes.  While looking in a mirror, press and release button in center of patch. A small green light will  flash 3-4 times. This will be your only indicator that the monitor has been turned on.  Do not shower for the first 24 hours. You may shower after the first 24 hours.  Press the button if you feel a symptom. You will hear a small click. Record Date, Time and  Symptom in the Patient Logbook.  When you are ready to remove the patch,  follow instructions on the last 2 pages of Patient  Logbook. Stick patch monitor onto the last page of Patient Logbook.  Place Patient Logbook in the blue and white box. Use locking tab on box and tape box closed  securely. The blue and white box has prepaid postage on it. Please place it in the mailbox as  soon as possible. Your physician should have your test results approximately 7 days after the  monitor has been mailed back to [facility].  Call [place] at [phone] if you have questions regarding   your ZIO XT patch monitor. Call them immediately if you see an orange light blinking on your  monitor.  If your monitor falls off in less than 4 days, contact our Monitor department at [phone].  If your monitor becomes loose or falls off after 4 days call [employer] at [phone] for  suggestions on securing your monitor    Follow-Up: At [facility], you and your health needs are our priority.  As part of our continuing mission to provide you with exceptional heart care, we have created designated Provider Care Teams.  These Care Teams include your primary Cardiologist (physician) and Advanced Practice Providers (APPs -  Physician Assistants and Nurse Practitioners) who all work together to provide you with the care you need, when you need it.  We recommend signing up for the patient portal called "MyChart".  Sign up information is provided on this After Visit Summary.  MyChart is used to connect with patients for Virtual Visits (Telemedicine).  Patients are able to view lab/test results, encounter notes, upcoming appointments, etc.  Non-urgent messages can be sent to your provider as well.   To learn more about what you can do with MyChart, go to [email or web address].    Your next appointment:   11/29/21 arrive at 1:45 for a nurse visit.  BRING YOUR MEDICATIONS TO THIS APPOINTMENT  12/14/21 ARRIVE AT 1:50 TO SEE   The format for your next appointment:   In Person  Provider:   Ambrose Pancoast, NP         Other Instructions Your physician has requested that you regularly monitor and record your blood pressure readings at home. Please use the same machine at the same time of day to check your readings and record them to bring to your follow-up visit.   Please monitor blood pressures and keep a log of your readings.    Make sure to check 2 hours after your medications for 2 weeks then call the office, [phone] and report them.    AVOID these things for 30 minutes  before checking your blood pressure: No Drinking caffeine. No Drinking alcohol. No Eating. No Smoking. No Exercising.   Five minutes before checking your blood pressure: Pee. Sit in a dining chair. Avoid sitting in a soft couch or armchair. Be quiet. Do not talk   Important Information About Sugar

## 2021-11-15 NOTE — Progress Notes (Unsigned)
ZIO XT serial # [id number] from office inventory applied to patient.  Dr. Harrington Challenger to read.

## 2021-11-16 LAB — BASIC METABOLIC PANEL
BUN/Creatinine Ratio: 9 — ABNORMAL LOW (ref 10–24)
BUN: 20 mg/dL (ref 8–27)
CO2: 28 mmol/L (ref 20–29)
Calcium: 9.4 mg/dL (ref 8.6–10.2)
Chloride: 98 mmol/L (ref 96–106)
Creatinine, Ser: 2.11 mg/dL — ABNORMAL HIGH (ref 0.76–1.27)
Glucose: 120 mg/dL — ABNORMAL HIGH (ref 70–99)
Potassium: 4.3 mmol/L (ref 3.5–5.2)
Sodium: 144 mmol/L (ref 134–144)
eGFR: 33 mL/min/{1.73_m2} — ABNORMAL LOW (ref >59)

## 2021-11-16 LAB — CBC
Hematocrit: 50.7 % (ref 37.5–51.0)
Hemoglobin: 16.1 g/dL (ref 13.0–17.7)
MCH: 26.5 pg — ABNORMAL LOW (ref 26.6–33.0)
MCHC: 31.8 g/dL (ref 31.5–35.7)
MCV: 84 fL (ref 79–97)
Platelets: 202 10*3/uL (ref 150–450)
RBC: 6.07 x10E6/uL — ABNORMAL HIGH (ref 4.14–5.80)
RDW: 13 % (ref 11.6–15.4)
WBC: 7.4 10*3/uL (ref 3.4–10.8)

## 2021-11-16 LAB — MAGNESIUM: Magnesium: 2.3 mg/dL (ref 1.6–2.3)

## 2021-11-23 DIAGNOSIS — I5032 Chronic diastolic (congestive) heart failure: Secondary | ICD-10-CM | POA: Diagnosis not present

## 2021-11-23 DIAGNOSIS — E039 Hypothyroidism, unspecified: Secondary | ICD-10-CM | POA: Diagnosis not present

## 2021-11-23 DIAGNOSIS — I5023 Acute on chronic systolic (congestive) heart failure: Secondary | ICD-10-CM | POA: Diagnosis not present

## 2021-11-23 DIAGNOSIS — N1831 Chronic kidney disease, stage 3a: Secondary | ICD-10-CM | POA: Diagnosis not present

## 2021-11-23 DIAGNOSIS — I130 Hypertensive heart and chronic kidney disease with heart failure and stage 1 through stage 4 chronic kidney disease, or unspecified chronic kidney disease: Secondary | ICD-10-CM | POA: Diagnosis not present

## 2021-11-23 DIAGNOSIS — E1122 Type 2 diabetes mellitus with diabetic chronic kidney disease: Secondary | ICD-10-CM | POA: Diagnosis not present

## 2021-11-26 ENCOUNTER — Telehealth: Payer: Self-pay

## 2021-11-26 NOTE — Telephone Encounter (Signed)
Marylu Lund., NP  P Cv Div Ch St Triage Renal function stable slightly improved with normal potassium and magnesium.  Please continue current treatment plan as discussed during yesterday's appointment.  Please let me know if you have any additional questions.   Ambrose Pancoast, NP   Calling interpreter to share the above provider note information... Dion Body  interpreter unavailable at time of call.   Interpreter line instructed to call them back in 1 hour to obtain an interpreter.  Follow up required.

## 2021-11-29 ENCOUNTER — Ambulatory Visit: Payer: Medicare Other | Attending: Internal Medicine

## 2021-11-29 VITALS — BP 130/80 | HR 72 | Ht 67.0 in | Wt 174.0 lb

## 2021-11-29 DIAGNOSIS — I10 Essential (primary) hypertension: Secondary | ICD-10-CM | POA: Diagnosis not present

## 2021-11-29 NOTE — Progress Notes (Signed)
   Nurse Visit   Date of Encounter: 11/29/2021 ID: Jorje Vanatta, DOB 23-Jan-1951, MRN [id number]  PCP:  Pa, [place] Providers Cardiologist:  None      Visit Details   VS:  BP 130/80   Pulse 72   Ht '5\' 7"'$  (1.702 m)   Wt 174 lb (78.9 kg)   BMI 27.25 kg/m  , BMI Body mass index is 27.25 kg/m.  Wt Readings from Last 3 Encounters:  11/29/21 174 lb (78.9 kg)  11/15/21 174 lb (78.9 kg)  10/16/21 177 lb 6.4 oz (80.5 kg)     Reason for visit: Pt presents today at the request of Ambrose Pancoast, Jr,NP for BP recheck.  Pt is accompanied by his wife and a [employer], [employer] representative. Performed today: Vital signs, Consult with Dr Ali Lowe, DOD and patient education Changes (medications, testing, etc.) : No Changes made today Length of Visit: 20 minutes    Medications Adjustments/Labs and Tests Ordered: No orders of the defined types were placed in this encounter.  No orders of the defined types were placed in this encounter.  Reviewed pt's current BP and medications with Dr Ali Lowe, DOD.  Recommendation is to continue current medications, continue to monitor BP at home and keep log and follow up as scheduled with NP on 12/13/2021 at 2:20pm.  Pt verbalizes understanding of all instructions.   Signed, Thora Lance, RN  11/29/2021 2:13 PM

## 2021-11-29 NOTE — Patient Instructions (Signed)
Medication Instructions:  Your physician recommends that you continue on your current medications as directed. Please refer to the Current Medication list given to you today.  *If you need a refill on your cardiac medications before your next appointment, please call your pharmacy*   Lab Work: None ordered.  If you have labs (blood work) drawn today and your tests are completely normal, you will receive your results only by: [place] (if you have MyChart) OR A paper copy in the mail If you have any lab test that is abnormal or we need to change your treatment, we will call you to review the results.   Testing/Procedures: None ordered.    Follow-Up: At [facility], you and your health needs are our priority.  As part of our continuing mission to provide you with exceptional heart care, we have created designated Provider Care Teams.  These Care Teams include your primary Cardiologist (physician) and Advanced Practice Providers (APPs -  Physician Assistants and Nurse Practitioners) who all work together to provide you with the care you need, when you need it.  We recommend signing up for the patient portal called "MyChart".  Sign up information is provided on this After Visit Summary.  MyChart is used to connect with patients for Virtual Visits (Telemedicine).  Patients are able to view lab/test results, encounter notes, upcoming appointments, etc.  Non-urgent messages can be sent to your provider as well.   To learn more about what you can do with MyChart, go to [email or web address].    Your next appointment:   12/13/2021 at 2:20pm  Important Information About Sugar

## 2021-11-30 ENCOUNTER — Telehealth: Payer: Self-pay | Admitting: Nurse Practitioner

## 2021-11-30 ENCOUNTER — Other Ambulatory Visit (HOSPITAL_COMMUNITY): Payer: Self-pay

## 2021-11-30 DIAGNOSIS — I5032 Chronic diastolic (congestive) heart failure: Secondary | ICD-10-CM | POA: Diagnosis not present

## 2021-11-30 DIAGNOSIS — N1831 Chronic kidney disease, stage 3a: Secondary | ICD-10-CM | POA: Diagnosis not present

## 2021-11-30 DIAGNOSIS — I5023 Acute on chronic systolic (congestive) heart failure: Secondary | ICD-10-CM | POA: Diagnosis not present

## 2021-11-30 DIAGNOSIS — E1122 Type 2 diabetes mellitus with diabetic chronic kidney disease: Secondary | ICD-10-CM | POA: Diagnosis not present

## 2021-11-30 DIAGNOSIS — I130 Hypertensive heart and chronic kidney disease with heart failure and stage 1 through stage 4 chronic kidney disease, or unspecified chronic kidney disease: Secondary | ICD-10-CM | POA: Diagnosis not present

## 2021-11-30 DIAGNOSIS — E039 Hypothyroidism, unspecified: Secondary | ICD-10-CM | POA: Diagnosis not present

## 2021-11-30 NOTE — Telephone Encounter (Signed)
Spoke with Amy who states that she called [facility] and was told that the patient used a coupon for his first 30 days of farxiga. His insurance does not cover it. We will need to see if we can help get patient started with patient assistance. Amy also called heart failure clinic and they stated that he was only seen in the transition of care clinic and they will not be following him.

## 2021-11-30 NOTE — Telephone Encounter (Signed)
Calling back to report update on patient. Please advise

## 2021-11-30 NOTE — Telephone Encounter (Signed)
New Message:    Amy called from [place] called and wanted  to know if patient is still taking [place]?  Pt c/o medication issue:  1. Name of Medication: Farxiga  2. How are you currently taking this medication (dosage and times per day)?   3. Are you having a reaction (difficulty breathing--STAT)?   4. What is your medication issue? Is patient still taking t his medicine

## 2021-11-30 NOTE — Telephone Encounter (Signed)
Notified that Wilder Glade is still on medication list and was started on 8/1 with the heart failure clinic, with a year of refills. She noted he is not taking it at this time and that he only picked up the 1st month supply. When Amy called [facility] they told her the medication was no covered by insurance. She is going to call them back again for more information after speaking with me. Also plans to call HF about Echo mentioned in note and follow up with them.

## 2021-12-03 NOTE — Telephone Encounter (Signed)
**Note De-Identified [place] Obfuscation** I have reached out to the pt Daniel Obrien a [facility] message.

## 2021-12-06 DIAGNOSIS — I5023 Acute on chronic systolic (congestive) heart failure: Secondary | ICD-10-CM | POA: Diagnosis not present

## 2021-12-06 DIAGNOSIS — I5032 Chronic diastolic (congestive) heart failure: Secondary | ICD-10-CM | POA: Diagnosis not present

## 2021-12-06 DIAGNOSIS — E039 Hypothyroidism, unspecified: Secondary | ICD-10-CM | POA: Diagnosis not present

## 2021-12-06 DIAGNOSIS — E1122 Type 2 diabetes mellitus with diabetic chronic kidney disease: Secondary | ICD-10-CM | POA: Diagnosis not present

## 2021-12-06 DIAGNOSIS — I130 Hypertensive heart and chronic kidney disease with heart failure and stage 1 through stage 4 chronic kidney disease, or unspecified chronic kidney disease: Secondary | ICD-10-CM | POA: Diagnosis not present

## 2021-12-06 DIAGNOSIS — N1831 Chronic kidney disease, stage 3a: Secondary | ICD-10-CM | POA: Diagnosis not present

## 2021-12-09 DIAGNOSIS — I130 Hypertensive heart and chronic kidney disease with heart failure and stage 1 through stage 4 chronic kidney disease, or unspecified chronic kidney disease: Secondary | ICD-10-CM | POA: Diagnosis not present

## 2021-12-09 DIAGNOSIS — R338 Other retention of urine: Secondary | ICD-10-CM | POA: Diagnosis not present

## 2021-12-09 DIAGNOSIS — E785 Hyperlipidemia, unspecified: Secondary | ICD-10-CM | POA: Diagnosis not present

## 2021-12-09 DIAGNOSIS — I5032 Chronic diastolic (congestive) heart failure: Secondary | ICD-10-CM | POA: Diagnosis not present

## 2021-12-09 DIAGNOSIS — E1122 Type 2 diabetes mellitus with diabetic chronic kidney disease: Secondary | ICD-10-CM | POA: Diagnosis not present

## 2021-12-09 DIAGNOSIS — I5023 Acute on chronic systolic (congestive) heart failure: Secondary | ICD-10-CM | POA: Diagnosis not present

## 2021-12-09 DIAGNOSIS — I080 Rheumatic disorders of both mitral and aortic valves: Secondary | ICD-10-CM | POA: Diagnosis not present

## 2021-12-09 DIAGNOSIS — N134 Hydroureter: Secondary | ICD-10-CM | POA: Diagnosis not present

## 2021-12-09 DIAGNOSIS — Z7984 Long term (current) use of oral hypoglycemic drugs: Secondary | ICD-10-CM | POA: Diagnosis not present

## 2021-12-09 DIAGNOSIS — N401 Enlarged prostate with lower urinary tract symptoms: Secondary | ICD-10-CM | POA: Diagnosis not present

## 2021-12-09 DIAGNOSIS — Z7982 Long term (current) use of aspirin: Secondary | ICD-10-CM | POA: Diagnosis not present

## 2021-12-09 DIAGNOSIS — Z91198 Patient's noncompliance with other medical treatment and regimen for other reason: Secondary | ICD-10-CM | POA: Diagnosis not present

## 2021-12-09 DIAGNOSIS — I429 Cardiomyopathy, unspecified: Secondary | ICD-10-CM | POA: Diagnosis not present

## 2021-12-09 DIAGNOSIS — Z9181 History of falling: Secondary | ICD-10-CM | POA: Diagnosis not present

## 2021-12-09 DIAGNOSIS — E039 Hypothyroidism, unspecified: Secondary | ICD-10-CM | POA: Diagnosis not present

## 2021-12-09 DIAGNOSIS — N1831 Chronic kidney disease, stage 3a: Secondary | ICD-10-CM | POA: Diagnosis not present

## 2021-12-10 DIAGNOSIS — R002 Palpitations: Secondary | ICD-10-CM | POA: Diagnosis not present

## 2021-12-12 NOTE — Progress Notes (Unsigned)
Office Visit    Patient Name: Daniel Obrien Date of Encounter: 12/13/2021  Primary Care Provider:  Pa, [place] Primary Cardiologist:  Dorris Carnes, MD Primary Electrophysiologist: None  Chief Complaint    Daniel Obrien is a 71 y.o. male with PMH of DM, HTN chronic combined CHF, DM type II, HTN, Dilated cardiomyopathy who presents for 1 month follow-up of CHF.  Past Medical History    Past Medical History:  Diagnosis Date   Acute CHF (congestive heart failure) ([place]) 12/17/2017   ARF (acute renal failure) ([place]) 12/17/2017   Cardiomyopathy ([place]), new, EF 30-35% 12/18/2017   Chest pain 12/17/2017   Demand ischemia ([place])    Diabetes ([place])    Elevated troponin 12/18/2017   Hydroureteronephrosis, left, mild to moderate 12/18/2017   Hypertension    Hyperthyroidism 12/18/2017   Penile pain 12/18/2017   Past Surgical History:  Procedure Laterality Date   NOSE SURGERY      Allergies  Allergies  Allergen Reactions   Heparin Shortness Of Breath and Other (See Comments)    Patient family stated when heparin is given patient starts shaking, wheezing. Chidester Obrien notified.    History of Present Illness   Daniel Obrien is a 71 year old male with above-mentioned past medical history who presents today for posthospital follow-up of CHF.  [name] was initially seen by Dr. Meda Coffee in 2019 for evaluation of chest pain.  Patient's initial blood pressure was 221/130 he was currently not on any prescription medications 2D echo was completed with EF of 30% with diffuse hypokinesis, with severely dilated and increased wall thickness, mild AV regurgitation with mild to moderate MV regurgitation and severely dilated LA with mildly dilated RV.  GDMT was optimized carefully.  Patient developed acute kidney injury with peak creatinine of 2.64.  Renal ultrasound was performed that was negative for hydronephrosis.  Patient was seen in follow-up and 2D echo was repeated showing improved EF of 50%  with no RWMA grade II DD, pulmonary arteries: PA peak pressure: 31 mm Hg.  He was seen in clinic on 05/2019 with complaints of dyspnea and elevated blood pressure.  Medications were adjusted and patient encouraged to use low-sodium diet.  Good hygiene previously   Patient was seen 09/2018 with complaint of shortness of breath.  Patient also endorsed some chest tightness and reportedly had been off of his medications for 4 months.  BNP was elevated to 1637 with demand ischemia noted and chest x-ray showed cardiomegaly with pulmonary vascular congestion.  2D echo was completed with reduced EF of 45-50% and global hypokinesis worse in the inferior base only dilated LV hypertrophy, and no evidence of stenosis or aortic valve stenosis.  Patient was diuresed with IV Lasix creatinine had worsened to 2.8.  His Lasix and SGLT2 were both discontinued due to renal function.  He was seen in the heart failure clinic on 10/16/2021.  He was restarted on Farxiga and Lasix was changed to 40 mg as needed.  He was also referred to nephrology.  During patient's follow-up his blood pressure was elevated at 186/104.  It was discovered that patient was taking medications incorrectly.  Patient's carvedilol was increased to 12.5 mg twice daily and 14-day ZIO monitor was placed for PVC burden.   [name] presents today for 1 month follow-up with his wife.  Patient is also accompanied by interpreter services for visit.  Since last being seen in the office patient reports doing well with no shortness of breath or  chest discomfort.  Patient's blood pressures today are much improved at 134/82 and heart rate is 63 bpm.  He is euvolemic on examination and states that he has been abstaining from excess salt in his diet.  Patient has been compliant with his current medications however he was not able to obtain [place] due to insurance not covering.  He was provided patient assistance paperwork.  During the visit we also reviewed results of his event  monitor.  Patient interpreter was very helpful and patient had all questions answered to his satisfaction.  Patient denies chest pain, palpitations, dyspnea, PND, orthopnea, nausea, vomiting, dizziness, syncope, edema, weight gain, or early satiety.  Home Medications    Current Outpatient Medications  Medication Sig Dispense Refill   aspirin EC 81 MG tablet Take 1 tablet (81 mg total) by mouth daily. Swallow whole. 30 tablet 0   atorvastatin (LIPITOR) 10 MG tablet Take 1 tablet (10 mg total) by mouth daily. 30 tablet 0   carvedilol (COREG) 12.5 MG tablet Take 1 tablet (12.5 mg total) by mouth 2 (two) times daily. 180 tablet 3   furosemide (LASIX) 40 MG tablet Take 1 tablet (40 mg total) by mouth as needed. Start 1 tab daily from Friday 7/28 30 tablet 0   tamsulosin (FLOMAX) 0.4 MG CAPS capsule Take 1 capsule (0.4 mg total) by mouth daily. 30 capsule 0   dapagliflozin propanediol (FARXIGA) 10 MG TABS tablet Take 1 tablet (10 mg total) by mouth daily before breakfast. (Patient not taking: Reported on 12/13/2021) 30 tablet 11   No current facility-administered medications for this visit.     Review of Systems  Please see the history of present illness.     All other systems reviewed and are otherwise negative except as noted above.  Physical Exam    Wt Readings from Last 3 Encounters:  12/13/21 174 lb (78.9 kg)  11/29/21 174 lb (78.9 kg)  11/15/21 174 lb (78.9 kg)   VS: Vitals:   12/13/21 1425  BP: 134/82  Pulse: 63  SpO2: 95%  ,Body mass index is 28.08 kg/m.  Constitutional:      Appearance: Healthy appearance. Not in distress.  Neck:     Vascular: JVD normal.  Pulmonary:     Effort: Pulmonary effort is normal.     Breath sounds: No wheezing. No rales. Diminished in the bases Cardiovascular:     Normal rate. Regular rhythm. Normal S1. Normal S2.      Murmurs: There is no murmur.  Edema:    Peripheral edema absent.  Abdominal:     Palpations: Abdomen is soft non tender.  There is no hepatomegaly.  Skin:    General: Skin is warm and dry.  Neurological:     General: No focal deficit present.     Mental Status: Alert and oriented to person, place and time.     Cranial Nerves: Cranial nerves are intact.  EKG/LABS/Other Studies Reviewed    ECG personally reviewed by me today -none completed today   Lab Results  Component Value Date   WBC 7.4 11/15/2021   HGB 16.1 11/15/2021   HCT 50.7 11/15/2021   MCV 84 11/15/2021   PLT 202 11/15/2021   Lab Results  Component Value Date   CREATININE 2.11 (H) 11/15/2021   BUN 20 11/15/2021   NA 144 11/15/2021   K 4.3 11/15/2021   CL 98 11/15/2021   CO2 28 11/15/2021   Lab Results  Component Value Date   ALT 45 (  H) 12/24/2017   AST 37 12/24/2017   ALKPHOS 85 12/24/2017   BILITOT 0.6 12/24/2017   Lab Results  Component Value Date   CHOL 206 (H) 10/04/2021   HDL 52 10/04/2021   LDLCALC 141 (H) 10/04/2021   TRIG 67 10/04/2021   CHOLHDL 4.0 10/04/2021    Lab Results  Component Value Date   HGBA1C 6.4 (H) 10/04/2021    Assessment & Plan    1.  Chronic combined systolic and diastolic CHF -NYHA class II -Most recent 2D echo completed with EF 45-50% and mildly decreased LV function -Today patient is euvolemic on examination -Titration of GDMT not possible due to elevated renal function  -Continue carvedilol 12.5 mg and Lasix 40 mg Low sodium diet, fluid restriction <2L, and daily weights encouraged. Educated to contact our office for weight gain of 2 lbs overnight or 5 lbs in one week.  -Recheck BMET today  2.  Hypertensive urgency: -Blood pressure today was well controlled at 134/82 -Continue carvedilol 12.5 mg twice daily  3.  Hyperlipidemia: -Currently monitored by PCP by PCP Continue Lipitor 10 mg   4.  CKD stage III: -Creatinine was 2.58 on 10/16/2021 -Today patient reported that he was taking 80 mg of Lasix daily -We will check BMET as noted above -Patient was referred to nephrology  however did not answer his phone due to language barrier -Referral recent to nephrology with instructions to contact patient's son only -We will recheck BMET     5.  PVCs: -Patient reports today that he has not felt many extra heartbeats over the last few weeks -14-day ZIO monitor to evaluate PVC burden revealed predominant sinus rhythm with average heart rate of 42 to 134 bpm  Disposition: Follow-up with Dorris Carnes, MD as scheduled   Medication Adjustments/Labs and Tests Ordered: Current medicines are reviewed at length with the patient today.  Concerns regarding medicines are outlined above.   Signed, Mable Fill, Marissa Nestle, NP 12/13/2021, 3:52 PM [facility]  Note:  This document was prepared using Dragon voice recognition software and may include unintentional dictation errors.

## 2021-12-13 ENCOUNTER — Telehealth: Payer: Self-pay

## 2021-12-13 ENCOUNTER — Encounter: Payer: Self-pay | Admitting: Nurse Practitioner

## 2021-12-13 ENCOUNTER — Ambulatory Visit: Payer: Medicare Other | Attending: Nurse Practitioner | Admitting: Nurse Practitioner

## 2021-12-13 VITALS — BP 134/82 | HR 63 | Ht 66.0 in | Wt 174.0 lb

## 2021-12-13 DIAGNOSIS — N1832 Chronic kidney disease, stage 3b: Secondary | ICD-10-CM | POA: Diagnosis not present

## 2021-12-13 DIAGNOSIS — I493 Ventricular premature depolarization: Secondary | ICD-10-CM | POA: Diagnosis not present

## 2021-12-13 DIAGNOSIS — I5042 Chronic combined systolic (congestive) and diastolic (congestive) heart failure: Secondary | ICD-10-CM | POA: Diagnosis not present

## 2021-12-13 DIAGNOSIS — E785 Hyperlipidemia, unspecified: Secondary | ICD-10-CM | POA: Diagnosis not present

## 2021-12-13 DIAGNOSIS — I10 Essential (primary) hypertension: Secondary | ICD-10-CM | POA: Insufficient documentation

## 2021-12-13 NOTE — Patient Instructions (Signed)
Medication Instructions:  Your physician recommends that you continue on your current medications as directed. Please refer to the Current Medication list given to you today.  *If you need a refill on your cardiac medications before your next appointment, please call your pharmacy*   Lab Work: TODAY: BMP If you have labs (blood work) drawn today and your tests are completely normal, you will receive your results only by: [place] (if you have MyChart) OR A paper copy in the mail If you have any lab test that is abnormal or we need to change your treatment, we will call you to review the results.   Testing/Procedures: Your provider has referred you to Nephrology.   Follow-Up: As scheduled At [facility], you and your health needs are our priority.  As part of our continuing mission to provide you with exceptional heart care, we have created designated Provider Care Teams.  These Care Teams include your primary Cardiologist (physician) and Advanced Practice Providers (APPs -  Physician Assistants and Nurse Practitioners) who all work together to provide you with the care you need, when you need it.  We recommend signing up for the patient portal called "MyChart".  Sign up information is provided on this After Visit Summary.  MyChart is used to connect with patients for Virtual Visits (Telemedicine).  Patients are able to view lab/test results, encounter notes, upcoming appointments, etc.  Non-urgent messages can be sent to your provider as well.   To learn more about what you can do with MyChart, go to [email or web address].     Provider:   Dorris Carnes, MD  Important Information About Sugar

## 2021-12-13 NOTE — Telephone Encounter (Signed)
-----   Message from Marylu Lund., NP sent at 12/13/2021  7:48 AM EDT ----- Please let Mr. Ruby know that his event monitor showed occasional extra beats in the top and bottom portion of his heart.  Please let him know that his average heart rate was 42 to 134 bpm.  These results are reassuring that your extra heartbeats in the bottom portion of the heart have resolved since your hospitalization.  We will discuss results further at your follow-up today.  Ambrose Pancoast, NP

## 2021-12-13 NOTE — Telephone Encounter (Signed)
Pt will be called today, needs [employer] controller. Pt also has appointment at [facility] on [facility] on 12/13/2021 at 220 pm. F/u still required.

## 2021-12-14 DIAGNOSIS — E039 Hypothyroidism, unspecified: Secondary | ICD-10-CM | POA: Diagnosis not present

## 2021-12-14 DIAGNOSIS — E1122 Type 2 diabetes mellitus with diabetic chronic kidney disease: Secondary | ICD-10-CM | POA: Diagnosis not present

## 2021-12-14 DIAGNOSIS — N1831 Chronic kidney disease, stage 3a: Secondary | ICD-10-CM | POA: Diagnosis not present

## 2021-12-14 DIAGNOSIS — I5023 Acute on chronic systolic (congestive) heart failure: Secondary | ICD-10-CM | POA: Diagnosis not present

## 2021-12-14 DIAGNOSIS — I5032 Chronic diastolic (congestive) heart failure: Secondary | ICD-10-CM | POA: Diagnosis not present

## 2021-12-14 DIAGNOSIS — I130 Hypertensive heart and chronic kidney disease with heart failure and stage 1 through stage 4 chronic kidney disease, or unspecified chronic kidney disease: Secondary | ICD-10-CM | POA: Diagnosis not present

## 2021-12-14 LAB — BASIC METABOLIC PANEL
BUN/Creatinine Ratio: 13 (ref 10–24)
BUN: 24 mg/dL (ref 8–27)
CO2: 27 mmol/L (ref 20–29)
Calcium: 9.4 mg/dL (ref 8.6–10.2)
Chloride: 99 mmol/L (ref 96–106)
Creatinine, Ser: 1.87 mg/dL — ABNORMAL HIGH (ref 0.76–1.27)
Glucose: 112 mg/dL — ABNORMAL HIGH (ref 70–99)
Potassium: 3.9 mmol/L (ref 3.5–5.2)
Sodium: 142 mmol/L (ref 134–144)
eGFR: 38 mL/min/{1.73_m2} — ABNORMAL LOW (ref >59)

## 2021-12-19 ENCOUNTER — Telehealth (HOSPITAL_COMMUNITY): Payer: Self-pay

## 2021-12-19 NOTE — Telephone Encounter (Signed)
Fax received about patient having scheduled appointment with [facility] on 01/04/2022'@11'$ :20

## 2021-12-28 DIAGNOSIS — E039 Hypothyroidism, unspecified: Secondary | ICD-10-CM | POA: Diagnosis not present

## 2021-12-28 DIAGNOSIS — N1831 Chronic kidney disease, stage 3a: Secondary | ICD-10-CM | POA: Diagnosis not present

## 2021-12-28 DIAGNOSIS — I130 Hypertensive heart and chronic kidney disease with heart failure and stage 1 through stage 4 chronic kidney disease, or unspecified chronic kidney disease: Secondary | ICD-10-CM | POA: Diagnosis not present

## 2021-12-28 DIAGNOSIS — I5032 Chronic diastolic (congestive) heart failure: Secondary | ICD-10-CM | POA: Diagnosis not present

## 2021-12-28 DIAGNOSIS — I5023 Acute on chronic systolic (congestive) heart failure: Secondary | ICD-10-CM | POA: Diagnosis not present

## 2021-12-28 DIAGNOSIS — E1122 Type 2 diabetes mellitus with diabetic chronic kidney disease: Secondary | ICD-10-CM | POA: Diagnosis not present

## 2022-01-08 DIAGNOSIS — I080 Rheumatic disorders of both mitral and aortic valves: Secondary | ICD-10-CM | POA: Diagnosis not present

## 2022-01-08 DIAGNOSIS — I5023 Acute on chronic systolic (congestive) heart failure: Secondary | ICD-10-CM | POA: Diagnosis not present

## 2022-01-08 DIAGNOSIS — N401 Enlarged prostate with lower urinary tract symptoms: Secondary | ICD-10-CM | POA: Diagnosis not present

## 2022-01-08 DIAGNOSIS — R338 Other retention of urine: Secondary | ICD-10-CM | POA: Diagnosis not present

## 2022-01-08 DIAGNOSIS — N134 Hydroureter: Secondary | ICD-10-CM | POA: Diagnosis not present

## 2022-01-08 DIAGNOSIS — Z7982 Long term (current) use of aspirin: Secondary | ICD-10-CM | POA: Diagnosis not present

## 2022-01-08 DIAGNOSIS — Z9181 History of falling: Secondary | ICD-10-CM | POA: Diagnosis not present

## 2022-01-08 DIAGNOSIS — Z7984 Long term (current) use of oral hypoglycemic drugs: Secondary | ICD-10-CM | POA: Diagnosis not present

## 2022-01-08 DIAGNOSIS — E785 Hyperlipidemia, unspecified: Secondary | ICD-10-CM | POA: Diagnosis not present

## 2022-01-08 DIAGNOSIS — Z91198 Patient's noncompliance with other medical treatment and regimen for other reason: Secondary | ICD-10-CM | POA: Diagnosis not present

## 2022-01-08 DIAGNOSIS — I130 Hypertensive heart and chronic kidney disease with heart failure and stage 1 through stage 4 chronic kidney disease, or unspecified chronic kidney disease: Secondary | ICD-10-CM | POA: Diagnosis not present

## 2022-01-08 DIAGNOSIS — E039 Hypothyroidism, unspecified: Secondary | ICD-10-CM | POA: Diagnosis not present

## 2022-01-08 DIAGNOSIS — N1831 Chronic kidney disease, stage 3a: Secondary | ICD-10-CM | POA: Diagnosis not present

## 2022-01-08 DIAGNOSIS — E1122 Type 2 diabetes mellitus with diabetic chronic kidney disease: Secondary | ICD-10-CM | POA: Diagnosis not present

## 2022-01-08 DIAGNOSIS — I429 Cardiomyopathy, unspecified: Secondary | ICD-10-CM | POA: Diagnosis not present

## 2022-01-08 DIAGNOSIS — I5032 Chronic diastolic (congestive) heart failure: Secondary | ICD-10-CM | POA: Diagnosis not present

## 2022-01-10 DIAGNOSIS — E039 Hypothyroidism, unspecified: Secondary | ICD-10-CM | POA: Diagnosis not present

## 2022-01-10 DIAGNOSIS — I5023 Acute on chronic systolic (congestive) heart failure: Secondary | ICD-10-CM | POA: Diagnosis not present

## 2022-01-10 DIAGNOSIS — N1831 Chronic kidney disease, stage 3a: Secondary | ICD-10-CM | POA: Diagnosis not present

## 2022-01-10 DIAGNOSIS — I5032 Chronic diastolic (congestive) heart failure: Secondary | ICD-10-CM | POA: Diagnosis not present

## 2022-01-10 DIAGNOSIS — E1122 Type 2 diabetes mellitus with diabetic chronic kidney disease: Secondary | ICD-10-CM | POA: Diagnosis not present

## 2022-01-10 DIAGNOSIS — I130 Hypertensive heart and chronic kidney disease with heart failure and stage 1 through stage 4 chronic kidney disease, or unspecified chronic kidney disease: Secondary | ICD-10-CM | POA: Diagnosis not present

## 2022-01-22 DIAGNOSIS — I5023 Acute on chronic systolic (congestive) heart failure: Secondary | ICD-10-CM | POA: Diagnosis not present

## 2022-01-22 DIAGNOSIS — I5032 Chronic diastolic (congestive) heart failure: Secondary | ICD-10-CM | POA: Diagnosis not present

## 2022-01-22 DIAGNOSIS — N1831 Chronic kidney disease, stage 3a: Secondary | ICD-10-CM | POA: Diagnosis not present

## 2022-01-22 DIAGNOSIS — E1122 Type 2 diabetes mellitus with diabetic chronic kidney disease: Secondary | ICD-10-CM | POA: Diagnosis not present

## 2022-01-22 DIAGNOSIS — E039 Hypothyroidism, unspecified: Secondary | ICD-10-CM | POA: Diagnosis not present

## 2022-01-22 DIAGNOSIS — I130 Hypertensive heart and chronic kidney disease with heart failure and stage 1 through stage 4 chronic kidney disease, or unspecified chronic kidney disease: Secondary | ICD-10-CM | POA: Diagnosis not present

## 2022-01-30 ENCOUNTER — Other Ambulatory Visit: Payer: Self-pay | Admitting: Nephrology

## 2022-01-30 DIAGNOSIS — I129 Hypertensive chronic kidney disease with stage 1 through stage 4 chronic kidney disease, or unspecified chronic kidney disease: Secondary | ICD-10-CM | POA: Diagnosis not present

## 2022-01-30 DIAGNOSIS — I5042 Chronic combined systolic (congestive) and diastolic (congestive) heart failure: Secondary | ICD-10-CM | POA: Diagnosis not present

## 2022-01-30 DIAGNOSIS — N1330 Unspecified hydronephrosis: Secondary | ICD-10-CM | POA: Diagnosis not present

## 2022-01-30 DIAGNOSIS — N1832 Chronic kidney disease, stage 3b: Secondary | ICD-10-CM

## 2022-01-30 DIAGNOSIS — E785 Hyperlipidemia, unspecified: Secondary | ICD-10-CM | POA: Diagnosis not present

## 2022-02-01 DIAGNOSIS — I130 Hypertensive heart and chronic kidney disease with heart failure and stage 1 through stage 4 chronic kidney disease, or unspecified chronic kidney disease: Secondary | ICD-10-CM | POA: Diagnosis not present

## 2022-02-01 DIAGNOSIS — I5032 Chronic diastolic (congestive) heart failure: Secondary | ICD-10-CM | POA: Diagnosis not present

## 2022-02-01 DIAGNOSIS — I5023 Acute on chronic systolic (congestive) heart failure: Secondary | ICD-10-CM | POA: Diagnosis not present

## 2022-02-01 DIAGNOSIS — N1831 Chronic kidney disease, stage 3a: Secondary | ICD-10-CM | POA: Diagnosis not present

## 2022-02-01 DIAGNOSIS — E1122 Type 2 diabetes mellitus with diabetic chronic kidney disease: Secondary | ICD-10-CM | POA: Diagnosis not present

## 2022-02-01 DIAGNOSIS — E039 Hypothyroidism, unspecified: Secondary | ICD-10-CM | POA: Diagnosis not present

## 2022-02-05 DIAGNOSIS — E1122 Type 2 diabetes mellitus with diabetic chronic kidney disease: Secondary | ICD-10-CM | POA: Diagnosis not present

## 2022-02-05 DIAGNOSIS — E039 Hypothyroidism, unspecified: Secondary | ICD-10-CM | POA: Diagnosis not present

## 2022-02-05 DIAGNOSIS — N1831 Chronic kidney disease, stage 3a: Secondary | ICD-10-CM | POA: Diagnosis not present

## 2022-02-05 DIAGNOSIS — I5023 Acute on chronic systolic (congestive) heart failure: Secondary | ICD-10-CM | POA: Diagnosis not present

## 2022-02-05 DIAGNOSIS — I130 Hypertensive heart and chronic kidney disease with heart failure and stage 1 through stage 4 chronic kidney disease, or unspecified chronic kidney disease: Secondary | ICD-10-CM | POA: Diagnosis not present

## 2022-02-05 DIAGNOSIS — I5032 Chronic diastolic (congestive) heart failure: Secondary | ICD-10-CM | POA: Diagnosis not present

## 2022-02-07 DIAGNOSIS — Z91198 Patient's noncompliance with other medical treatment and regimen for other reason: Secondary | ICD-10-CM | POA: Diagnosis not present

## 2022-02-07 DIAGNOSIS — I5032 Chronic diastolic (congestive) heart failure: Secondary | ICD-10-CM | POA: Diagnosis not present

## 2022-02-07 DIAGNOSIS — N1831 Chronic kidney disease, stage 3a: Secondary | ICD-10-CM | POA: Diagnosis not present

## 2022-02-07 DIAGNOSIS — I080 Rheumatic disorders of both mitral and aortic valves: Secondary | ICD-10-CM | POA: Diagnosis not present

## 2022-02-07 DIAGNOSIS — I130 Hypertensive heart and chronic kidney disease with heart failure and stage 1 through stage 4 chronic kidney disease, or unspecified chronic kidney disease: Secondary | ICD-10-CM | POA: Diagnosis not present

## 2022-02-07 DIAGNOSIS — Z7982 Long term (current) use of aspirin: Secondary | ICD-10-CM | POA: Diagnosis not present

## 2022-02-07 DIAGNOSIS — I429 Cardiomyopathy, unspecified: Secondary | ICD-10-CM | POA: Diagnosis not present

## 2022-02-07 DIAGNOSIS — N401 Enlarged prostate with lower urinary tract symptoms: Secondary | ICD-10-CM | POA: Diagnosis not present

## 2022-02-07 DIAGNOSIS — Z7984 Long term (current) use of oral hypoglycemic drugs: Secondary | ICD-10-CM | POA: Diagnosis not present

## 2022-02-07 DIAGNOSIS — E785 Hyperlipidemia, unspecified: Secondary | ICD-10-CM | POA: Diagnosis not present

## 2022-02-07 DIAGNOSIS — Z9181 History of falling: Secondary | ICD-10-CM | POA: Diagnosis not present

## 2022-02-07 DIAGNOSIS — E1122 Type 2 diabetes mellitus with diabetic chronic kidney disease: Secondary | ICD-10-CM | POA: Diagnosis not present

## 2022-02-07 DIAGNOSIS — N134 Hydroureter: Secondary | ICD-10-CM | POA: Diagnosis not present

## 2022-02-07 DIAGNOSIS — E039 Hypothyroidism, unspecified: Secondary | ICD-10-CM | POA: Diagnosis not present

## 2022-02-07 DIAGNOSIS — I5023 Acute on chronic systolic (congestive) heart failure: Secondary | ICD-10-CM | POA: Diagnosis not present

## 2022-02-07 DIAGNOSIS — R338 Other retention of urine: Secondary | ICD-10-CM | POA: Diagnosis not present

## 2022-02-12 NOTE — Progress Notes (Unsigned)
Cardiology Office Note   Date:  02/13/2022   ID:  Daniel Obrien, DOB 1950/11/26, MRN [id number]  PCP:  [facility]  Cardiologist:   Dorris Carnes, MD  Previously Liane Comber   Pt presents for follow up of HFrEF and HTN     History of Present Illness: Daniel Obrien is a 71 y.o. male with a history of DM, HTN, HFrEF,  He was last seen in cardiology in Sept 2023 by E Dick  Pt first seen by Liane Comber in 2019 for CP  BP 221/130   Echo showed LVEF 30% with severe dilation and hypertrophy.  LA also severely dilated   RV dilated.   Pt started on medical Rx    Note Cr at time 2.64.   Echo in repeat showed LVEF 50% with Gr II DD,       Patient was seen 09/2018 with complaint of shortness of breath.  Patient also endorsed some chest tightness and reportedly had been off of his medications for 4 months.  BNP was elevated to 1637 with demand ischemia noted and chest x-ray showed cardiomegaly with pulmonary vascular congestion.  2D echo was completed with reduced EF of 45-50% and global hypokinesis worse in the inferior base only dilated LV hypertrophy, and no evidence of stenosis or aortic valve stenosis.  Patient was diuresed with IV Lasix creatinine had worsened to 2.8.  His Lasix and SGLT2 were both discontinued due to renal function.  He was seen in the heart failure clinic on 10/16/2021.  He was restarted on Farxiga and Lasix was changed to 40 mg as needed.  He was also referred to nephrology.  During patient's follow-up his blood pressure was elevated at 186/104.  It was discovered that patient was taking medications incorrectly.  Patient's carvedilol was increased to 12.5 mg twice daily and 14-day ZIO monitor was placed for PVC burden.   Patient seen by Elvin So in Sept 2023    Since seen his breathing is OK   No idzziness  no CP    Walks     Diet:   Eats rice daily        Current Meds  Medication Sig   aspirin EC 81 MG tablet Take 1 tablet (81 mg total) by mouth daily. Swallow whole.    atorvastatin (LIPITOR) 10 MG tablet Take 1 tablet (10 mg total) by mouth daily.   carvedilol (COREG) 12.5 MG tablet Take 1 tablet (12.5 mg total) by mouth 2 (two) times daily.   dapagliflozin propanediol (FARXIGA) 10 MG TABS tablet Take 1 tablet (10 mg total) by mouth daily before breakfast.   furosemide (LASIX) 40 MG tablet Take 1 tablet (40 mg total) by mouth as needed. Start 1 tab daily from Friday 7/28   tamsulosin (FLOMAX) 0.4 MG CAPS capsule Take 1 capsule (0.4 mg total) by mouth daily.     Allergies:   Heparin   Past Medical History:  Diagnosis Date   Acute CHF (congestive heart failure) ([place]) 12/17/2017   ARF (acute renal failure) ([place]) 12/17/2017   Cardiomyopathy ([place]), new, EF 30-35% 12/18/2017   Chest pain 12/17/2017   Demand ischemia    Diabetes ([place])    Elevated troponin 12/18/2017   Hydroureteronephrosis, left, mild to moderate 12/18/2017   Hypertension    Hyperthyroidism 12/18/2017   Penile pain 12/18/2017    Past Surgical History:  Procedure Laterality Date   NOSE SURGERY       Social History:  The patient  reports that he has never smoked. He has never used smokeless tobacco. He reports that he does not drink alcohol and does not use drugs.   Family History:  The patient's family history includes Unexplained death (age of onset: 18) in his father; Unexplained death (age of onset: 63) in his mother.    ROS:  Please see the history of present illness. All other systems are reviewed and  Negative to the above problem except as noted.    PHYSICAL EXAM: VS:  BP 132/82   Pulse 66   Ht '5\' 6"'$  (1.676 m)   Wt 179 lb 9.6 oz (81.5 kg)   SpO2 97%   BMI 28.99 kg/m   GEN: Well nourished, well developed, in no acute distress  HEENT: normal  Neck: no JVD, carotid bruits iac: RRR; no murmurs,  No LE edema  Respiratory:  clear to auscultation bilaterally,  GI: soft, nontender, nondistended, + BS  No hepatomegaly  MS: no deformity Moving all extremities   Skin: warm and  dry, no rash Neuro:  Strength and sensation are intact Psych: euthymic mood, full affect   EKG:  EKG is not ordered today.   Lipid Panel    Component Value Date/Time   CHOL 206 (H) 10/04/2021 1702   TRIG 67 10/04/2021 1702   HDL 52 10/04/2021 1702   CHOLHDL 4.0 10/04/2021 1702   VLDL 13 10/04/2021 1702   LDLCALC 141 (H) 10/04/2021 1702      Wt Readings from Last 3 Encounters:  02/13/22 179 lb 9.6 oz (81.5 kg)  12/13/21 174 lb (78.9 kg)  11/29/21 174 lb (78.9 kg)      ASSESSMENT AND PLAN:  1  HTN   BP is fair  Better at home  Continue meds  2  HFmrEF   LVEF 45 to 50% on recent echo  Volume good    Keep on same regimen   3  DM   On farxiga    Discussed  diet  CUT back on rice   Check A1C on return  4  HL  Increase lipitor to 40  LDL was 140  5  PAD  Atherosclerosis of aorta       6  CKD  Last Cr 1.89  Follow   Labs in 2 months Clinic in April/may    Current medicines are reviewed at length with the patient today.  The patient does not have concerns regarding medicines.  Signed, Dorris Carnes, MD  02/13/2022 9:28 AM    [address] [place] [address], [employer], [place]  [address] Phone: [phone]; Fax: [phone]

## 2022-02-13 ENCOUNTER — Encounter: Payer: Self-pay | Admitting: Internal Medicine

## 2022-02-13 ENCOUNTER — Ambulatory Visit: Payer: Medicare Other | Attending: Internal Medicine | Admitting: Internal Medicine

## 2022-02-13 VITALS — BP 132/82 | HR 66 | Ht 66.0 in | Wt 179.6 lb

## 2022-02-13 DIAGNOSIS — E785 Hyperlipidemia, unspecified: Secondary | ICD-10-CM | POA: Diagnosis not present

## 2022-02-13 DIAGNOSIS — Z79899 Other long term (current) drug therapy: Secondary | ICD-10-CM | POA: Diagnosis not present

## 2022-02-13 MED ORDER — ATORVASTATIN CALCIUM 40 MG PO TABS: 40.0000 mg | ORAL_TABLET | Freq: Every day | ORAL | 3 refills | Status: AC

## 2022-02-13 NOTE — Patient Instructions (Signed)
Medication Instructions:  Increase Lipitor to 40 mg daily  *If you need a refill on your cardiac medications before your next appointment, please call your pharmacy*   Lab Work: Nmr, apo b, lipo a, hgba1c and hepatic in 8 weeks fasting  If you have labs (blood work) drawn today and your tests are completely normal, you will receive your results only by: [place] (if you have MyChart) OR A paper copy in the mail If you have any lab test that is abnormal or we need to change your treatment, we will call you to review the results.   Testing/Procedures:    Follow-Up: At [facility], you and your health needs are our priority.  As part of our continuing mission to provide you with exceptional heart care, we have created designated Provider Care Teams.  These Care Teams include your primary Cardiologist (physician) and Advanced Practice Providers (APPs -  Physician Assistants and Nurse Practitioners) who all work together to provide you with the care you need, when you need it.  We recommend signing up for the patient portal called "MyChart".  Sign up information is provided on this After Visit Summary.  MyChart is used to connect with patients for Virtual Visits (Telemedicine).  Patients are able to view lab/test results, encounter notes, upcoming appointments, etc.  Non-urgent messages can be sent to your provider as well.   To learn more about what you can do with MyChart, go to [email or web address].    Your next appointment:   6 month(s)  The format for your next appointment:   In Person  Provider:   Dorris Carnes, MD     Other Instructions   Important Information About Sugar

## 2022-02-14 DIAGNOSIS — I5022 Chronic systolic (congestive) heart failure: Secondary | ICD-10-CM | POA: Diagnosis not present

## 2022-02-14 DIAGNOSIS — I429 Cardiomyopathy, unspecified: Secondary | ICD-10-CM | POA: Diagnosis not present

## 2022-02-14 DIAGNOSIS — Z79899 Other long term (current) drug therapy: Secondary | ICD-10-CM | POA: Diagnosis not present

## 2022-02-14 DIAGNOSIS — Z23 Encounter for immunization: Secondary | ICD-10-CM | POA: Diagnosis not present

## 2022-02-14 DIAGNOSIS — N184 Chronic kidney disease, stage 4 (severe): Secondary | ICD-10-CM | POA: Diagnosis not present

## 2022-02-14 DIAGNOSIS — E039 Hypothyroidism, unspecified: Secondary | ICD-10-CM | POA: Diagnosis not present

## 2022-02-14 DIAGNOSIS — E1122 Type 2 diabetes mellitus with diabetic chronic kidney disease: Secondary | ICD-10-CM | POA: Diagnosis not present

## 2022-02-15 DIAGNOSIS — E039 Hypothyroidism, unspecified: Secondary | ICD-10-CM | POA: Diagnosis not present

## 2022-02-15 DIAGNOSIS — I130 Hypertensive heart and chronic kidney disease with heart failure and stage 1 through stage 4 chronic kidney disease, or unspecified chronic kidney disease: Secondary | ICD-10-CM | POA: Diagnosis not present

## 2022-02-15 DIAGNOSIS — I5032 Chronic diastolic (congestive) heart failure: Secondary | ICD-10-CM | POA: Diagnosis not present

## 2022-02-15 DIAGNOSIS — N1831 Chronic kidney disease, stage 3a: Secondary | ICD-10-CM | POA: Diagnosis not present

## 2022-02-15 DIAGNOSIS — I5023 Acute on chronic systolic (congestive) heart failure: Secondary | ICD-10-CM | POA: Diagnosis not present

## 2022-02-15 DIAGNOSIS — E1122 Type 2 diabetes mellitus with diabetic chronic kidney disease: Secondary | ICD-10-CM | POA: Diagnosis not present

## 2022-02-21 DIAGNOSIS — E039 Hypothyroidism, unspecified: Secondary | ICD-10-CM | POA: Diagnosis not present

## 2022-02-21 DIAGNOSIS — N1831 Chronic kidney disease, stage 3a: Secondary | ICD-10-CM | POA: Diagnosis not present

## 2022-02-21 DIAGNOSIS — I5032 Chronic diastolic (congestive) heart failure: Secondary | ICD-10-CM | POA: Diagnosis not present

## 2022-02-21 DIAGNOSIS — I5023 Acute on chronic systolic (congestive) heart failure: Secondary | ICD-10-CM | POA: Diagnosis not present

## 2022-02-21 DIAGNOSIS — E1122 Type 2 diabetes mellitus with diabetic chronic kidney disease: Secondary | ICD-10-CM | POA: Diagnosis not present

## 2022-02-21 DIAGNOSIS — I130 Hypertensive heart and chronic kidney disease with heart failure and stage 1 through stage 4 chronic kidney disease, or unspecified chronic kidney disease: Secondary | ICD-10-CM | POA: Diagnosis not present

## 2022-03-01 DIAGNOSIS — I5032 Chronic diastolic (congestive) heart failure: Secondary | ICD-10-CM | POA: Diagnosis not present

## 2022-03-01 DIAGNOSIS — N1831 Chronic kidney disease, stage 3a: Secondary | ICD-10-CM | POA: Diagnosis not present

## 2022-03-01 DIAGNOSIS — I5023 Acute on chronic systolic (congestive) heart failure: Secondary | ICD-10-CM | POA: Diagnosis not present

## 2022-03-01 DIAGNOSIS — E1122 Type 2 diabetes mellitus with diabetic chronic kidney disease: Secondary | ICD-10-CM | POA: Diagnosis not present

## 2022-03-01 DIAGNOSIS — E039 Hypothyroidism, unspecified: Secondary | ICD-10-CM | POA: Diagnosis not present

## 2022-03-01 DIAGNOSIS — I130 Hypertensive heart and chronic kidney disease with heart failure and stage 1 through stage 4 chronic kidney disease, or unspecified chronic kidney disease: Secondary | ICD-10-CM | POA: Diagnosis not present

## 2022-03-07 DIAGNOSIS — I5032 Chronic diastolic (congestive) heart failure: Secondary | ICD-10-CM | POA: Diagnosis not present

## 2022-03-07 DIAGNOSIS — E039 Hypothyroidism, unspecified: Secondary | ICD-10-CM | POA: Diagnosis not present

## 2022-03-07 DIAGNOSIS — E1122 Type 2 diabetes mellitus with diabetic chronic kidney disease: Secondary | ICD-10-CM | POA: Diagnosis not present

## 2022-03-07 DIAGNOSIS — I130 Hypertensive heart and chronic kidney disease with heart failure and stage 1 through stage 4 chronic kidney disease, or unspecified chronic kidney disease: Secondary | ICD-10-CM | POA: Diagnosis not present

## 2022-03-07 DIAGNOSIS — I5023 Acute on chronic systolic (congestive) heart failure: Secondary | ICD-10-CM | POA: Diagnosis not present

## 2022-03-07 DIAGNOSIS — N1831 Chronic kidney disease, stage 3a: Secondary | ICD-10-CM | POA: Diagnosis not present

## 2022-04-10 ENCOUNTER — Ambulatory Visit: Payer: Medicare Other | Attending: Internal Medicine

## 2022-04-10 DIAGNOSIS — Z79899 Other long term (current) drug therapy: Secondary | ICD-10-CM | POA: Diagnosis not present

## 2022-04-10 DIAGNOSIS — E785 Hyperlipidemia, unspecified: Secondary | ICD-10-CM | POA: Diagnosis not present

## 2022-04-11 LAB — NMR, LIPOPROFILE
Cholesterol, Total: 111 mg/dL (ref 100–199)
HDL Particle Number: 24.3 umol/L — ABNORMAL LOW (ref >=30.5)
HDL-C: 36 mg/dL — ABNORMAL LOW (ref >39)
LDL Particle Number: 465 nmol/L (ref <1000)
LDL Size: 20.1 nm — ABNORMAL LOW (ref >20.5)
LDL-C (NIH Calc): 48 mg/dL (ref 0–99)
LP-IR Score: 60 — ABNORMAL HIGH (ref <=45)
Small LDL Particle Number: 307 nmol/L (ref <=527)
Triglycerides: 159 mg/dL — ABNORMAL HIGH (ref 0–149)

## 2022-04-11 LAB — HEPATIC FUNCTION PANEL
ALT: 18 IU/L (ref 0–44)
AST: 19 IU/L (ref 0–40)
Albumin: 4.5 g/dL (ref 3.9–4.9)
Alkaline Phosphatase: 104 IU/L (ref 44–121)
Bilirubin Total: 0.9 mg/dL (ref 0.0–1.2)
Bilirubin, Direct: 0.22 mg/dL (ref 0.00–0.40)
Total Protein: 8 g/dL (ref 6.0–8.5)

## 2022-04-11 LAB — HEMOGLOBIN A1C
Est. average glucose Bld gHb Est-mCnc: 154 mg/dL
Hgb A1c MFr Bld: 7 % — ABNORMAL HIGH (ref 4.8–5.6)

## 2022-04-11 LAB — LIPOPROTEIN A (LPA): Lipoprotein (a): 125.6 nmol/L — ABNORMAL HIGH (ref <75.0)

## 2022-04-11 LAB — APOLIPOPROTEIN B: Apolipoprotein B: 64 mg/dL (ref <90)

## 2022-05-17 DIAGNOSIS — N1832 Chronic kidney disease, stage 3b: Secondary | ICD-10-CM | POA: Diagnosis not present

## 2022-05-17 DIAGNOSIS — E7800 Pure hypercholesterolemia, unspecified: Secondary | ICD-10-CM | POA: Diagnosis not present

## 2022-05-17 DIAGNOSIS — Z1211 Encounter for screening for malignant neoplasm of colon: Secondary | ICD-10-CM | POA: Diagnosis not present

## 2022-05-17 DIAGNOSIS — E1122 Type 2 diabetes mellitus with diabetic chronic kidney disease: Secondary | ICD-10-CM | POA: Diagnosis not present

## 2022-05-17 DIAGNOSIS — Z79899 Other long term (current) drug therapy: Secondary | ICD-10-CM | POA: Diagnosis not present

## 2022-05-17 DIAGNOSIS — Z0001 Encounter for general adult medical examination with abnormal findings: Secondary | ICD-10-CM | POA: Diagnosis not present

## 2022-08-15 ENCOUNTER — Ambulatory Visit: Payer: Medicare Other | Admitting: Internal Medicine

## 2022-10-20 NOTE — Progress Notes (Signed)
Cardiology Office Note   Date:  10/24/2022   ID:  Daniel Obrien, DOB 09/21/1950, MRN [id number]  PCP:  [facility]  Cardiologist:   Dietrich Pates, MD    Pt presents for follow up of HFrEF and HTN     History of Present Illness: Daniel Obrien is a 72 y.o. male with a history of DM,severe HTN, HFrEF (echo 2020 LVEF 30%; severe LAE), CKD, chest pain. Last echo in July 2023:  LVEF 45 to 50%   Zio patch in Aug 2023 showed occasional PVCs, few short bursts of SVT. I last saw the pt in Nov 2023    Since seen the pt denies CP   Breathing is OK   No dizziness   No palpitations   BP higher at times at home   Current Meds  Medication Sig   aspirin EC 81 MG tablet Take 1 tablet (81 mg total) by mouth daily. Swallow whole.   atorvastatin (LIPITOR) 40 MG tablet Take 1 tablet (40 mg total) by mouth daily.   carvedilol (COREG) 12.5 MG tablet Take 1 tablet (12.5 mg total) by mouth 2 (two) times daily.   dapagliflozin propanediol (FARXIGA) 10 MG TABS tablet Take 1 tablet (10 mg total) by mouth daily before breakfast.   furosemide (LASIX) 40 MG tablet Take 1 tablet (40 mg total) by mouth as needed. Start 1 tab daily from Friday 7/28   tamsulosin (FLOMAX) 0.4 MG CAPS capsule Take 1 capsule (0.4 mg total) by mouth daily.     Allergies:   Heparin   Past Medical History:  Diagnosis Date   Acute CHF (congestive heart failure) ([facility]) 12/17/2017   ARF (acute renal failure) ([facility]) 12/17/2017   Cardiomyopathy ([facility]), new, EF 30-35% 12/18/2017   Chest pain 12/17/2017   Demand ischemia    Diabetes ([facility])    Elevated troponin 12/18/2017   Hydroureteronephrosis, left, mild to moderate 12/18/2017   Hypertension    Hyperthyroidism 12/18/2017   Penile pain 12/18/2017    Past Surgical History:  Procedure Laterality Date   NOSE SURGERY       Social History:  The patient  reports that he has never smoked. He has never used smokeless tobacco. He reports that he does not drink alcohol and does not  use drugs.   Family History:  The patient's family history includes Unexplained death (age of onset: 66) in his father; Unexplained death (age of onset: 12) in his mother.    ROS:  Please see the history of present illness. All other systems are reviewed and  Negative to the above problem except as noted.    PHYSICAL EXAM: VS:  BP 128/80   Pulse (!) 58   Ht 5\' 6"  (1.676 m)   Wt 181 lb 6.4 oz (82.3 kg)   SpO2 97%   BMI 29.28 kg/m   GEN: Well nourished, well developed, in no acute distress  HEENT: normal  Neck: JVP is normal, no carotid bruit iac: RRR; no murmurs,  No LE edema  Respiratory:  clear to auscultation GI: soft, nontender, nondistended, + BS  No hepatomegaly  MS: no deformity Moving all extremities   Skin: warm and dry, no rash Neuro:  Strength and sensation are intact Psych: euthymic mood, full affect   EKG:  EKG ishows SB 58 bpm   First degere AV block   224 msec   LVH     Zio patch   Aug 2023  Rhythm    Sinus rhythm  Rates 42 to 134 bpm   Average HR 65 bpm Occasional PVCs, rare PACs    Few short bursts of SVT, longest lasting 6 beats at 156 bpm  No trggered events.   Echo   July 2023   1. Global hypokinesis worse in the inferior base. Left ventricular  ejection fraction, by estimation, is 45 to 50%. The left ventricle has  mildly decreased function. The left ventricle has no regional wall motion  abnormalities. The left ventricular  internal cavity size was mildly dilated. There is mild left ventricular  hypertrophy. Left ventricular diastolic parameters were normal.   2. Right ventricular systolic function is normal. The right ventricular  size is normal.   3. Left atrial size was moderately dilated.   4. The mitral valve is normal in structure. No evidence of mitral valve  regurgitation. No evidence of mitral stenosis.   5. The aortic valve is tricuspid. There is mild calcification of the  aortic valve. There is mild thickening of the aortic valve.  Aortic valve  regurgitation is mild. Aortic valve sclerosis is present, with no evidence  of aortic valve stenosis.   6. The inferior vena cava is normal in size with greater than 50%  respiratory variability, suggesting right atrial pressure of 3 mmHg.   Lipid Panel    Component Value Date/Time   CHOL 206 (H) 10/04/2021 1702   TRIG 67 10/04/2021 1702   HDL 52 10/04/2021 1702   CHOLHDL 4.0 10/04/2021 1702   VLDL 13 10/04/2021 1702   LDLCALC 141 (H) 10/04/2021 1702      Wt Readings from Last 3 Encounters:  10/24/22 181 lb 6.4 oz (82.3 kg)  02/13/22 179 lb 9.6 oz (81.5 kg)  12/13/21 174 lb (78.9 kg)      ASSESSMENT AND PLAN:  1  HFmrEF   LVEF 45 to 50% by echo in 2023   VOlume status is OK   Follow    2  HTN  BP is OK today  Continue to follow closely at home    If 130s and higher I would add low dose hydralazine 5 tid    3  DM   On farxiga   A1C was 6.8 in March 2024    4  HL  Last lipids in march 2024 LDL 49   HDL 40  Trig 133  5  PAD  Continue statin    6  CKD  Will check BMET    Follow up in clinic this winter   Current medicines are reviewed at length with the patient today.  The patient does not have concerns regarding medicines.  Signed, Dietrich Pates, MD  10/24/2022 9:19 AM    [facility] [address] [place], [place], [place]  [address] Phone: [phone]; Fax: [phone]

## 2022-10-24 ENCOUNTER — Ambulatory Visit: Payer: Medicare Other | Attending: Internal Medicine | Admitting: Internal Medicine

## 2022-10-24 ENCOUNTER — Encounter: Payer: Self-pay | Admitting: Internal Medicine

## 2022-10-24 VITALS — BP 128/80 | HR 58 | Ht 66.0 in | Wt 181.4 lb

## 2022-10-24 DIAGNOSIS — I5023 Acute on chronic systolic (congestive) heart failure: Secondary | ICD-10-CM | POA: Diagnosis not present

## 2022-10-24 LAB — BASIC METABOLIC PANEL
BUN/Creatinine Ratio: 12 (ref 10–24)
BUN: 24 mg/dL (ref 8–27)
CO2: 30 mmol/L — ABNORMAL HIGH (ref 20–29)
Calcium: 9.3 mg/dL (ref 8.6–10.2)
Chloride: 98 mmol/L (ref 96–106)
Creatinine, Ser: 2.06 mg/dL — ABNORMAL HIGH (ref 0.76–1.27)
Glucose: 123 mg/dL — ABNORMAL HIGH (ref 70–99)
Potassium: 4 mmol/L (ref 3.5–5.2)
Sodium: 140 mmol/L (ref 134–144)
eGFR: 34 mL/min/{1.73_m2} — ABNORMAL LOW (ref >59)

## 2022-10-24 NOTE — Patient Instructions (Signed)
Medication Instructions:  No changes *If you need a refill on your cardiac medications before your next appointment, please call your pharmacy*   Lab Work: Bmet today If you have labs (blood work) drawn today and your tests are completely normal, you will receive your results only by: MyChart Message (if you have MyChart) OR A paper copy in the mail If you have any lab test that is abnormal or we need to change your treatment, we will call you to review the results.   Testing/Procedures: none   Follow-Up: At [facility], you and your health needs are our priority.  As part of our continuing mission to provide you with exceptional heart care, we have created designated Provider Care Teams.  These Care Teams include your primary Cardiologist (physician) and Advanced Practice Providers (APPs -  Physician Assistants and Nurse Practitioners) who all work together to provide you with the care you need, when you need it.   Your next appointment:   5 month(s) (Jan 2025)  Provider:   Dietrich Pates, MD

## 2022-11-06 ENCOUNTER — Other Ambulatory Visit: Payer: Self-pay | Admitting: Nurse Practitioner

## 2022-11-20 DIAGNOSIS — I429 Cardiomyopathy, unspecified: Secondary | ICD-10-CM | POA: Diagnosis not present

## 2022-11-20 DIAGNOSIS — I5022 Chronic systolic (congestive) heart failure: Secondary | ICD-10-CM | POA: Diagnosis not present

## 2022-11-20 DIAGNOSIS — E7800 Pure hypercholesterolemia, unspecified: Secondary | ICD-10-CM | POA: Diagnosis not present

## 2022-11-20 DIAGNOSIS — Z1211 Encounter for screening for malignant neoplasm of colon: Secondary | ICD-10-CM | POA: Diagnosis not present

## 2022-11-20 DIAGNOSIS — N1832 Chronic kidney disease, stage 3b: Secondary | ICD-10-CM | POA: Diagnosis not present

## 2022-11-20 DIAGNOSIS — Z79899 Other long term (current) drug therapy: Secondary | ICD-10-CM | POA: Diagnosis not present

## 2022-11-20 DIAGNOSIS — E1122 Type 2 diabetes mellitus with diabetic chronic kidney disease: Secondary | ICD-10-CM | POA: Diagnosis not present

## 2022-11-20 DIAGNOSIS — Z23 Encounter for immunization: Secondary | ICD-10-CM | POA: Diagnosis not present

## 2023-05-20 DIAGNOSIS — Z79899 Other long term (current) drug therapy: Secondary | ICD-10-CM | POA: Diagnosis not present

## 2023-05-20 DIAGNOSIS — I429 Cardiomyopathy, unspecified: Secondary | ICD-10-CM | POA: Diagnosis not present

## 2023-05-20 DIAGNOSIS — I5022 Chronic systolic (congestive) heart failure: Secondary | ICD-10-CM | POA: Diagnosis not present

## 2023-05-20 DIAGNOSIS — N1832 Chronic kidney disease, stage 3b: Secondary | ICD-10-CM | POA: Diagnosis not present

## 2023-05-20 DIAGNOSIS — E7800 Pure hypercholesterolemia, unspecified: Secondary | ICD-10-CM | POA: Diagnosis not present

## 2023-05-20 DIAGNOSIS — Z125 Encounter for screening for malignant neoplasm of prostate: Secondary | ICD-10-CM | POA: Diagnosis not present

## 2023-05-20 DIAGNOSIS — Z1331 Encounter for screening for depression: Secondary | ICD-10-CM | POA: Diagnosis not present

## 2023-05-20 DIAGNOSIS — E1122 Type 2 diabetes mellitus with diabetic chronic kidney disease: Secondary | ICD-10-CM | POA: Diagnosis not present

## 2023-05-20 DIAGNOSIS — Z1211 Encounter for screening for malignant neoplasm of colon: Secondary | ICD-10-CM | POA: Diagnosis not present

## 2023-05-20 DIAGNOSIS — Z0001 Encounter for general adult medical examination with abnormal findings: Secondary | ICD-10-CM | POA: Diagnosis not present

## 2023-05-22 DIAGNOSIS — Z1211 Encounter for screening for malignant neoplasm of colon: Secondary | ICD-10-CM | POA: Diagnosis not present

## 2023-06-10 DIAGNOSIS — E1122 Type 2 diabetes mellitus with diabetic chronic kidney disease: Secondary | ICD-10-CM | POA: Diagnosis not present

## 2023-06-10 DIAGNOSIS — I5022 Chronic systolic (congestive) heart failure: Secondary | ICD-10-CM | POA: Diagnosis not present

## 2023-06-10 DIAGNOSIS — I429 Cardiomyopathy, unspecified: Secondary | ICD-10-CM | POA: Diagnosis not present

## 2023-06-10 DIAGNOSIS — N1832 Chronic kidney disease, stage 3b: Secondary | ICD-10-CM | POA: Diagnosis not present

## 2023-06-10 DIAGNOSIS — Z79899 Other long term (current) drug therapy: Secondary | ICD-10-CM | POA: Diagnosis not present

## 2023-09-11 DIAGNOSIS — I5022 Chronic systolic (congestive) heart failure: Secondary | ICD-10-CM | POA: Diagnosis not present

## 2023-09-11 DIAGNOSIS — E7800 Pure hypercholesterolemia, unspecified: Secondary | ICD-10-CM | POA: Diagnosis not present

## 2023-09-11 DIAGNOSIS — N1832 Chronic kidney disease, stage 3b: Secondary | ICD-10-CM | POA: Diagnosis not present

## 2023-09-11 DIAGNOSIS — I429 Cardiomyopathy, unspecified: Secondary | ICD-10-CM | POA: Diagnosis not present

## 2023-09-11 DIAGNOSIS — E1122 Type 2 diabetes mellitus with diabetic chronic kidney disease: Secondary | ICD-10-CM | POA: Diagnosis not present

## 2023-09-11 DIAGNOSIS — Z79899 Other long term (current) drug therapy: Secondary | ICD-10-CM | POA: Diagnosis not present

## 2023-11-11 ENCOUNTER — Other Ambulatory Visit: Payer: Self-pay | Admitting: Internal Medicine

## 2023-12-31 ENCOUNTER — Other Ambulatory Visit: Payer: Self-pay | Admitting: Internal Medicine

## 2024-01-12 DIAGNOSIS — I5022 Chronic systolic (congestive) heart failure: Secondary | ICD-10-CM | POA: Diagnosis not present

## 2024-01-12 DIAGNOSIS — E1122 Type 2 diabetes mellitus with diabetic chronic kidney disease: Secondary | ICD-10-CM | POA: Diagnosis not present

## 2024-01-12 DIAGNOSIS — I429 Cardiomyopathy, unspecified: Secondary | ICD-10-CM | POA: Diagnosis not present

## 2024-01-12 DIAGNOSIS — Z79899 Other long term (current) drug therapy: Secondary | ICD-10-CM | POA: Diagnosis not present

## 2024-01-12 DIAGNOSIS — E7800 Pure hypercholesterolemia, unspecified: Secondary | ICD-10-CM | POA: Diagnosis not present

## 2024-01-12 DIAGNOSIS — N1832 Chronic kidney disease, stage 3b: Secondary | ICD-10-CM | POA: Diagnosis not present

## 2024-01-26 DIAGNOSIS — E1122 Type 2 diabetes mellitus with diabetic chronic kidney disease: Secondary | ICD-10-CM | POA: Diagnosis not present

## 2024-01-26 DIAGNOSIS — I5022 Chronic systolic (congestive) heart failure: Secondary | ICD-10-CM | POA: Diagnosis not present

## 2024-01-26 DIAGNOSIS — N1832 Chronic kidney disease, stage 3b: Secondary | ICD-10-CM | POA: Diagnosis not present

## 2024-01-26 DIAGNOSIS — Z79899 Other long term (current) drug therapy: Secondary | ICD-10-CM | POA: Diagnosis not present

## 2024-02-10 DIAGNOSIS — E1122 Type 2 diabetes mellitus with diabetic chronic kidney disease: Secondary | ICD-10-CM | POA: Diagnosis not present

## 2024-02-10 DIAGNOSIS — Z79899 Other long term (current) drug therapy: Secondary | ICD-10-CM | POA: Diagnosis not present
